# Patient Record
Sex: Female | Born: 1970
Health system: Southern US, Community
[De-identification: ages and names within clinical notes are randomized; demographics above are authoritative.]

## PROBLEM LIST (undated history)

## (undated) ENCOUNTER — Ambulatory Visit: Payer: 59 | Source: Home / Self Care

## (undated) DIAGNOSIS — B009 Herpesviral infection, unspecified: Secondary | ICD-10-CM

## (undated) DIAGNOSIS — F32A Depression, unspecified: Secondary | ICD-10-CM

## (undated) DIAGNOSIS — R519 Headache, unspecified: Secondary | ICD-10-CM

## (undated) DIAGNOSIS — I639 Cerebral infarction, unspecified: Secondary | ICD-10-CM

## (undated) DIAGNOSIS — I82409 Acute embolism and thrombosis of unspecified deep veins of unspecified lower extremity: Secondary | ICD-10-CM

## (undated) DIAGNOSIS — R51 Headache: Secondary | ICD-10-CM

## (undated) DIAGNOSIS — N390 Urinary tract infection, site not specified: Secondary | ICD-10-CM

## (undated) HISTORY — DX: Herpesviral infection, unspecified: B00.9

## (undated) HISTORY — PX: APPENDECTOMY: SHX54

---

## 1997-11-19 ENCOUNTER — Ambulatory Visit (HOSPITAL_COMMUNITY): Admission: RE | Admit: 1997-11-19 | Discharge: 1997-11-19 | Payer: Self-pay | Admitting: Obstetrics and Gynecology

## 1998-06-15 ENCOUNTER — Other Ambulatory Visit: Admission: RE | Admit: 1998-06-15 | Discharge: 1998-06-15 | Payer: Self-pay | Admitting: *Deleted

## 1999-06-16 ENCOUNTER — Other Ambulatory Visit: Admission: RE | Admit: 1999-06-16 | Discharge: 1999-06-16 | Payer: Self-pay | Admitting: *Deleted

## 2000-01-25 ENCOUNTER — Ambulatory Visit (HOSPITAL_COMMUNITY): Admission: RE | Admit: 2000-01-25 | Discharge: 2000-01-25 | Payer: Self-pay

## 2000-01-26 ENCOUNTER — Inpatient Hospital Stay (HOSPITAL_COMMUNITY): Admission: EM | Admit: 2000-01-26 | Discharge: 2000-01-28 | Payer: Self-pay | Admitting: Emergency Medicine

## 2000-01-26 ENCOUNTER — Encounter (INDEPENDENT_AMBULATORY_CARE_PROVIDER_SITE_OTHER): Payer: Self-pay | Admitting: *Deleted

## 2002-05-28 ENCOUNTER — Encounter: Payer: Self-pay | Admitting: Emergency Medicine

## 2002-05-28 ENCOUNTER — Emergency Department (HOSPITAL_COMMUNITY): Admission: EM | Admit: 2002-05-28 | Discharge: 2002-05-28 | Payer: Self-pay | Admitting: Emergency Medicine

## 2002-06-02 ENCOUNTER — Encounter: Admission: RE | Admit: 2002-06-02 | Discharge: 2002-08-31 | Payer: Self-pay | Admitting: Internal Medicine

## 2003-07-03 ENCOUNTER — Emergency Department (HOSPITAL_COMMUNITY): Admission: EM | Admit: 2003-07-03 | Discharge: 2003-07-03 | Payer: Self-pay | Admitting: Emergency Medicine

## 2006-03-20 DIAGNOSIS — Z8673 Personal history of transient ischemic attack (TIA), and cerebral infarction without residual deficits: Secondary | ICD-10-CM | POA: Diagnosis present

## 2006-03-20 DIAGNOSIS — I639 Cerebral infarction, unspecified: Secondary | ICD-10-CM

## 2006-03-20 HISTORY — DX: Cerebral infarction, unspecified: I63.9

## 2007-08-21 ENCOUNTER — Observation Stay (HOSPITAL_COMMUNITY): Admission: EM | Admit: 2007-08-21 | Discharge: 2007-08-22 | Payer: Self-pay | Admitting: Emergency Medicine

## 2008-07-02 ENCOUNTER — Ambulatory Visit (HOSPITAL_COMMUNITY): Admission: RE | Admit: 2008-07-02 | Discharge: 2008-07-02 | Payer: Self-pay | Admitting: Family Medicine

## 2008-07-30 ENCOUNTER — Inpatient Hospital Stay (HOSPITAL_COMMUNITY): Admission: AD | Admit: 2008-07-30 | Discharge: 2008-07-30 | Payer: Self-pay | Admitting: Obstetrics and Gynecology

## 2008-09-04 ENCOUNTER — Ambulatory Visit (HOSPITAL_COMMUNITY): Admission: RE | Admit: 2008-09-04 | Discharge: 2008-09-04 | Payer: Self-pay | Admitting: Obstetrics and Gynecology

## 2008-09-29 ENCOUNTER — Encounter: Admission: RE | Admit: 2008-09-29 | Discharge: 2008-09-29 | Payer: Self-pay | Admitting: Obstetrics and Gynecology

## 2008-10-13 ENCOUNTER — Ambulatory Visit (HOSPITAL_COMMUNITY): Admission: RE | Admit: 2008-10-13 | Discharge: 2008-10-13 | Payer: Self-pay | Admitting: Obstetrics and Gynecology

## 2008-12-14 ENCOUNTER — Inpatient Hospital Stay (HOSPITAL_COMMUNITY): Admission: AD | Admit: 2008-12-14 | Discharge: 2008-12-14 | Payer: Self-pay | Admitting: Obstetrics and Gynecology

## 2009-01-12 ENCOUNTER — Inpatient Hospital Stay (HOSPITAL_COMMUNITY): Admission: AD | Admit: 2009-01-12 | Discharge: 2009-01-13 | Payer: Self-pay | Admitting: Obstetrics

## 2009-01-28 ENCOUNTER — Inpatient Hospital Stay (HOSPITAL_COMMUNITY): Admission: AD | Admit: 2009-01-28 | Discharge: 2009-01-31 | Payer: Self-pay | Admitting: Obstetrics and Gynecology

## 2009-02-02 ENCOUNTER — Encounter: Admission: RE | Admit: 2009-02-02 | Discharge: 2009-03-03 | Payer: Self-pay | Admitting: Obstetrics and Gynecology

## 2009-02-02 ENCOUNTER — Ambulatory Visit: Admission: RE | Admit: 2009-02-02 | Discharge: 2009-02-02 | Payer: Self-pay | Admitting: Obstetrics and Gynecology

## 2009-03-04 ENCOUNTER — Encounter: Admission: RE | Admit: 2009-03-04 | Discharge: 2009-03-18 | Payer: Self-pay | Admitting: Obstetrics and Gynecology

## 2010-04-11 ENCOUNTER — Encounter: Payer: Self-pay | Admitting: Obstetrics and Gynecology

## 2010-06-22 LAB — CBC
HCT: 31.1 % — ABNORMAL LOW (ref 36.0–46.0)
HCT: 34.3 % — ABNORMAL LOW (ref 36.0–46.0)
Hemoglobin: 11.4 g/dL — ABNORMAL LOW (ref 12.0–15.0)
Platelets: 147 10*3/uL — ABNORMAL LOW (ref 150–400)
Platelets: 159 10*3/uL (ref 150–400)
RDW: 14.1 % (ref 11.5–15.5)
RDW: 14.5 % (ref 11.5–15.5)
WBC: 10.4 10*3/uL (ref 4.0–10.5)
WBC: 7.4 10*3/uL (ref 4.0–10.5)

## 2010-06-22 LAB — GLUCOSE, CAPILLARY
Glucose-Capillary: 111 mg/dL — ABNORMAL HIGH (ref 70–99)
Glucose-Capillary: 133 mg/dL — ABNORMAL HIGH (ref 70–99)
Glucose-Capillary: 68 mg/dL — ABNORMAL LOW (ref 70–99)

## 2010-06-24 LAB — URINALYSIS, ROUTINE W REFLEX MICROSCOPIC
Nitrite: NEGATIVE
Protein, ur: NEGATIVE mg/dL
Specific Gravity, Urine: 1.015 (ref 1.005–1.030)
Urobilinogen, UA: 0.2 mg/dL (ref 0.0–1.0)

## 2010-08-02 NOTE — Discharge Summary (Signed)
NAME:  Susan King, Susan King                  ACCOUNT NO.:  [id number]   MEDICAL RECORD NO.:  [id number]          PATIENT TYPE:  INP   LOCATION:  3730                         FACILITY:  [facility]   PHYSICIAN:  Mohan N. Sharyn Lull, M.D. DATE OF BIRTH:  June 08, 1970   DATE OF ADMISSION:  08/21/2007  DATE OF DISCHARGE:  08/22/2007                               DISCHARGE SUMMARY   ADMITTING DIAGNOSES:  1. Chest pain, some features worrisome for angina.  2. Rule out myocardial infarction.  3. Tobacco abuse.  4. History of herpes simplex infection.  5. Positive family history of coronary artery disease.   DISCHARGE DIAGNOSES:  1. Status post chest pain.  2. Myocardial infraction, ruled out.  3. Status post left catheterization.  4. Mild coronary artery disease.  5. Rule out gastroesophageal reflux disease.  6. Tobacco abuse.  7. History of herpes simplex infection in the past.  8. Positive family history of coronary artery disease.  9. Hypercholesteremia, controlled by diet.   DISCHARGE MEDICATIONS:  1. Enteric-coated aspirin 81 mg 1 tablet daily.  2. Nexium 40 mg 1 capsule daily, half hour before breakfast.  3. Nitrostat 0.4 mg sublingual use as directed.   DIET:  Low salt, low cholesterol, and heart healthy diet.  Post cardiac  cath instructions have been given.  Increase activity slowly.  Avoid any  lifting, driving, pushing, or pulling for 48 hours.  Follow up with me  next week.   CONDITION AT DISCHARGE:  Stable.   BRIEF HISTORY AND HOSPITAL COURSE:  Mr. Ledell Peoples is a 40 year old black  female with no significant past medical history except for herpes  simplex infection and tobacco abuse.  She came to the ER via EMS  complaining of retrosternal chest pain described as pressure and sharp  since this morning.  While at work, states pain got worse, so decided to  go to [facility].  She states chest pain radiated to  the left arm associated with numbness, relieved with aspirin  and  sublingual nitro and transferred to [facility] by EMS.  EKG  done in the ER showed normal sinus rhythm with nonspecific ST-T wave  changes in the inferior leads.  Denies such episodes of chest pain in  the past.  Denies exertional chest pain, but gets chest pain when on  distress.   PAST MEDICAL HISTORY:  As above.   PAST SURGICAL HISTORY:  She had appendectomy 7 years ago.   SOCIAL HISTORY:  She is single, 1 child.  Smokes less than 1 pack per  day for 19 years.  Drinks wine occasionally socially.  Works as a  Production designer, theatre/television/film for [employer].   FAMILY HISTORY:  Father died of MI at the age of 60.  Mother is alive.  She is hypertensive.  She has high cholesterol.  One sister has  congestive heart failure.   PHYSICAL EXAMINATION:  GENERAL:  She is alert, awake, and oriented x3,  in no acute distress.  VITAL SIGNS:  Blood pressure was 109/81, pulse was 72 regular.  Conjunctivae was pink.  NECK:  Supple.  No JVD.  No bruit.  LUNGS:  Clear to auscultation without rhonchi or rales.  CARDIOVASCULAR:  S1 and S2 is normal.  There was soft systolic murmur.  There was no history of gallop.  There was no rub.  ABDOMEN:  Soft.  Bowel sounds are present, nontender.  EXTREMITIES:  There is no clubbing, cyanosis, or edema.   LABS:  Two sets of cardiac enzymes were negative. Her hemoglobin was  12.6, hematocrit 38, white count of 5.5.  Sodium was 137, potassium 3.6,  glucose 81, BUN 9, and creatinine 0.67.  Her C-reactive protein was  elevated at 1.6.  Urine pregnancy test was negative. Cholesterol was 158  and triglycerides were 241, HDL was 31, and LDL was 89.   BRIEF HOSPITAL COURSE:  The patient was admitted to telemetry unit.  MI  was ruled out by serial enzymes and EKG.  I discussed with the patient  regarding noninvasive stress testing versus left cath possible PTCA  stenting, its risks and benefits, i.e. death, MI, stroke, need for  emergency CABG, risk of restenosis, and local  vascular complications,  etc. and consented for invasive procedure.  The patient subsequently  underwent left cardiac cath with selective left and right coronary  angiography.  This morning, the patient tolerated procedure well.  There  are no complications as per procedure report.  The patient did not have  any episodes of chest pain during the hospital stay.  The patient's  groin is stable with no evidence of hematoma or bruit.  The patient will  be discharged home later this afternoon if remained hemodynamically  stable.      Eduardo Osier. Sharyn Lull, M.D.  Electronically Signed     MNH/MEDQ  D:  08/22/2007  T:  08/23/2007  Job:  [id number]

## 2010-08-02 NOTE — Cardiovascular Report (Signed)
NAME:  MUSE, Mehgan                  ACCOUNT NO.:  [id number]   MEDICAL RECORD NO.:  [id number]          PATIENT TYPE:  INP   LOCATION:  3730                         FACILITY:  [facility]   PHYSICIAN:  Mohan N. Sharyn Lull, M.D. DATE OF BIRTH:  Apr 02, 1970   DATE OF PROCEDURE:  08/22/2007  DATE OF DISCHARGE:  08/22/2007                            CARDIAC CATHETERIZATION   PROCEDURES:  1. Left cardiac catheterization with selective left and right coronary      angiography.  2. Left ventricular graft via right groin using Judkins technique.   INDICATIONS FOR PROCEDURE:  Ms. Ledell Peoples is a 40 year old black female with  no significant past medical history except for herpes simplex and  tobacco abuse.  She came to the ER via EMS complaining of retrosternal  chest pain, described as pressure and sharp since this a.m. while at  work.  She states the pain got worse, so decided to go to [facility].  She states chest pain radiated to left arm associated  with numbness in the arm.  The patient received enteric-coated aspirin  and sublingual nitroglycerin with partial relief and was [facility].  EKG done by EMS showed normal sinus rhythm  and nonspecific T-wave changes in the inferior leads.  She denies any  history of such episode of chest pain in the past.  She denies  exertional chest pain, but gets chest pain when under stress.   PAST MEDICAL HISTORY:  As above.   PAST SURGICAL HISTORY:  She had appendectomy approximately 7 years ago.   SOCIAL HISTORY:  She is single.  She has one child.  She smoked less  than 1 pack per day for 19 years.  She drinks wine occasionally  socially.  She works as Production designer, theatre/television/film for [employer].   FAMILY HISTORY:  Father died of MI at the age of 67.  Mother is alive,  she is hypertensive, she has high cholesterol.  One sister has  congestive heart failure.   PHYSICAL EXAMINATION:  GENERAL:  She is alert and oriented x3, in no  acute distress.  VITAL SIGNS:  Blood pressure was 109/81 and pulse was 72 and regular.  EYES:  Conjunctivae were pink.  NECK:  Supple.  No JVD.  No bruit.  LUNGS:  Clear to auscultation without rhonchi or rales.  CARDIOVASCULAR:  S1 and S2 were normal.  There was soft systolic murmur.  There was no S3, gallop, or rub.  ABDOMEN:  Soft, bowel sounds present, and nontender.  EXTREMITIES:  There is no clubbing, cyanosis, or edema.   BRIEF HOSPITAL COURSE:  The patient was admitted to telemetry unit.  MI  was ruled out by serial enzymes and EKG.  I discussed with the patient  and her mother and boyfriend at length regarding noninvasive stress  testing versus left cath, possible PTCA stenting and its risks and  benefits i.e., death, MI, stroke, need for emergency CABG, risk of  restenosis, local vascular complications, etc., and consented for PCI.   PROCEDURE:  After obtaining the informed consent, the  patient was  brought to the cath lab and was placed on fluoroscopy table.  The right  groin was prepped and draped in the usual fashion.  A 2% Xylocaine was  used for local anesthesia in the right groin.  With the help of thin-  walled needle, a 6-French arterial sheath was placed.  The sheath was  aspirated and flushed.  Next, a 6-French left Judkins catheter was  advanced over the wire under fluoroscopic guidance up to the ascending  aorta.  Wire was pulled out.  The catheter was aspirated and connected  to the manifold.  Catheter was further advanced and engaged into left  coronary ostium.  Multiple views of the left system were taken.  Next,  the catheter was disengaged and was pulled out over the wire and was  replaced with 6-French right Judkins catheter, which was advanced over  the wire under fluoroscopic guidance up to the ascending aorta.  Wire  was pulled out.  The catheter was aspirated and connected to the  manifold.  Catheter was further advanced and engaged into right coronary   ostium.  Multiple views of the right system were taken.  Next, the  catheter was disengaged and was pulled out over the wire and was  replaced with 6-French pigtail catheter, which was advanced over the  wire under fluoroscopic guidance up to the ascending aorta.  Catheter  was further advanced across the aortic valve into the LV.  LV pressures  were recorded.  Next, LV graft was done in 30 degrees RAO position.  Postangiographic pressures were recorded from LV and then pullback  pressures were recorded from the aorta.  There was no gradient across  the aortic valve.  Next, the pigtail catheter was pulled out over the  wire.  Sheaths were aspirated and flushed.   FINDINGS:  LV showed good LV systolic function, EF of 55-60%.  Left main  was patent.  LAD has 5-10% mid stenosis.  Diagonal 1 to diagonal 4 were  very small, which were patent.  Left circumflex was patent.  OM1 was  small, which was patent.  OM-2 was very, very small which was patent.  OM-3 was moderate size, which was patent.  OM-4 and OM-5 were small,  which were patent.  RCA was patent.  PDA was very small, which was  patent.  PLV branches were very, very small.  The patient has codominant  coronary system.  The patient tolerated the procedure well.  There were  no complications.  The patient was transferred to the recovery room in  stable condition.      Eduardo Osier. Sharyn Lull, M.D.  Electronically Signed     MNH/MEDQ  D:  08/22/2007  T:  08/22/2007  Job:  [id number]

## 2010-08-02 NOTE — H&P (Signed)
Susan King, Susan King                  ACCOUNT NO.:  [id number]   MEDICAL RECORD NO.:  [id number]          PATIENT TYPE:  OUT   LOCATION:  MFM                           FACILITY:  [facility]   PHYSICIAN:  Lenoard Aden, M.D.DATE OF BIRTH:  Nov 15, 1970   DATE OF ADMISSION:  10/13/2008  DATE OF DISCHARGE:  10/13/2008                              HISTORY & PHYSICAL   CHIEF COMPLAINT:  Pelvic pressure.   She is a 40 year old African American female G2, P1 with a history of  preterm birth at 53 weeks, who presents now for pelvic pressure.  She  denies regular contractions, bleeding, or leakage of fluid.  Has had low  back pain.  Pregnancy has been complicated by preterm cervical change  with stable cervical length on ultrasound.   Her medications include Valtrex and prenatal vitamins.  She also takes  17-hydroxyprogesterone weekly.   She is a nonsmoker, nondrinker.   Denies domestic physical violence.  History of preterm birth as noted.   PHYSICAL EXAMINATION:  GENERAL:  She is a well-developed, well-nourished  [place] female in no acute distress.  HEENT: Normal.  LUNGS:  Clear.  HEART:  Regular rate and rhythm.  ABDOMEN:  Soft, gravid, nontender.  No CVA tenderness.  EXTREMITIES:  No cords.  NEUROLOGIC:  Nonfocal.  SKIN:  Intact.  No herpes lesions noted.  PELVIC:  Cervix is closed, 2 cm on vertex, -1.   NST is reactive.  No contractions are noted in initial monitoring  pattern.  FFN is pending.   IMPRESSION:  1. 32-week OB.  2. Gestational diabetic, stable on diet.  3. History of preterm birth on 17-hydroxyprogesterone weekly.   PLAN:  Check FFN.  Continuous monitoring x1 hour.  Check urinalysis.  Discharge home pending results.      Lenoard Aden, M.D.  Electronically Signed    RJT/MEDQ  D:  12/14/2008  T:  12/15/2008  Job:  [id number]

## 2010-08-05 NOTE — Op Note (Signed)
[facility]  Patient:    Susan King, Susan King                         MRN: [id number] Proc. Date: 01/26/00 Adm. Date:  [id number] Attending:  Meredith Leeds                           Operative Report  PREOPERATIVE DIAGNOSIS:  Acute appendicitis.  POSTOPERATIVE DIAGNOSIS:  Acute appendicitis.  OPERATION:  Laparoscopic appendectomy.  SURGEON:  Zigmund Daniel, M.D.  ANESTHESIA:  General.  PROCEDURE:  After adequate monitoring and general anesthesia and routine preparation and draping of the abdomen, and insertion of a Foley catheter, I made a small infraumbilical incision and dissected down to the fascia and opened it longitudinally for about 1 cm.  I opened the peritoneum bluntly.  I placed an 0 Vicryl purse-string suture and secured a Hasson cannula.  Then, inflated the abdomen with CO2.  I then, under direct vision, placed a port in the right mid abdomen 5 mm in size and used Glassman clamp to mobilize the appendix slightly and pull up on the cecum and I could see that the appendix was inflamed at the tip and otherwise appeared normal.  No other abnormalities in the pelvis were noted.  I put in a 12 mm left lower quadrant port under direct vision and dissected the mesoappendix with electrocautery and then clipped the appendiceal artery and divided it.  I then occluded and divided the appendix with the endoscopic stapler.  The appendix was rather small, so I pulled it up into the 12 mm port and removed it inside the port avoiding contamination of the incision.  I then carefully inspected the area of operation and found that bleeding was under good control and that the appendiceal stump was well-occluded.  I removed the ports under direct vision. I tied the purse-string suture after removing the CO2.  I closed the skin of all incisions with intracuticular 4-0 Vicryl and Steri-Strips.  The patient tolerated the operation well. DD:   01/26/00 TD:  01/26/00 Job: [id number]

## 2010-12-15 LAB — APTT: aPTT: 27

## 2010-12-15 LAB — CBC
HCT: 35.7 — ABNORMAL LOW
Hemoglobin: 12.2
MCHC: 34.2
MCV: 85.5
MCV: 86.1
Platelets: 213
Platelets: 226
RBC: 4.15
RBC: 4.4
WBC: 5.4
WBC: 5.5
WBC: 6.4

## 2010-12-15 LAB — LIPID PANEL
Cholesterol: 168
HDL: 31 — ABNORMAL LOW
Total CHOL/HDL Ratio: 5.4
Triglycerides: 241 — ABNORMAL HIGH

## 2010-12-15 LAB — BASIC METABOLIC PANEL
CO2: 23
Calcium: 8.8
Creatinine, Ser: 0.77
GFR calc Af Amer: >60
GFR calc non Af Amer: >60
Glucose, Bld: 98
Sodium: 138

## 2010-12-15 LAB — DIFFERENTIAL
Basophils Absolute: 0.1
Basophils Relative: 2 — ABNORMAL HIGH
Eosinophils Absolute: 0.2
Eosinophils Relative: 4
Monocytes Absolute: 0.4
Monocytes Relative: 8
Neutro Abs: 2.3

## 2010-12-15 LAB — CK TOTAL AND CKMB (NOT AT ARMC)
CK, MB: 1.3
Total CK: 119

## 2010-12-15 LAB — POCT CARDIAC MARKERS
CKMB, poc: 1.1
Myoglobin, poc: 52.5
Operator id: 272551
Troponin i, poc: <0.05

## 2010-12-15 LAB — CARDIAC PANEL(CRET KIN+CKTOT+MB+TROPI)
CK, MB: 1.3
Relative Index: 1.1
Total CK: 118
Total CK: 79
Troponin I: <0.01

## 2010-12-15 LAB — COMPREHENSIVE METABOLIC PANEL
ALT: 21
Albumin: 3.7
Alkaline Phosphatase: 76
CO2: 25
Calcium: 8.8
Creatinine, Ser: 0.67
GFR calc Af Amer: >60
Total Bilirubin: 1.1

## 2010-12-15 LAB — POCT I-STAT, CHEM 8
BUN: 11
Calcium, Ion: 1.09 — ABNORMAL LOW
HCT: 39
Hemoglobin: 13.3
Sodium: 138
TCO2: 25

## 2011-05-23 ENCOUNTER — Other Ambulatory Visit: Payer: Self-pay | Admitting: Family Medicine

## 2011-05-23 DIAGNOSIS — N852 Hypertrophy of uterus: Secondary | ICD-10-CM

## 2011-05-25 ENCOUNTER — Other Ambulatory Visit: Payer: Self-pay

## 2011-05-30 ENCOUNTER — Ambulatory Visit
Admission: RE | Admit: 2011-05-30 | Discharge: 2011-05-30 | Disposition: A | Discharge status: Home / Self Care | Payer: 59 | Source: Ambulatory Visit | Attending: Family Medicine | Admitting: Family Medicine

## 2011-05-30 DIAGNOSIS — N852 Hypertrophy of uterus: Secondary | ICD-10-CM

## 2012-06-21 ENCOUNTER — Emergency Department (HOSPITAL_COMMUNITY)
Admission: EM | Admit: 2012-06-21 | Discharge: 2012-06-21 | Disposition: A | Discharge status: Home / Self Care | Payer: 59 | Attending: Emergency Medicine | Admitting: Emergency Medicine

## 2012-06-21 ENCOUNTER — Encounter (HOSPITAL_COMMUNITY): Payer: Self-pay | Admitting: Emergency Medicine

## 2012-06-21 DIAGNOSIS — R55 Syncope and collapse: Secondary | ICD-10-CM | POA: Insufficient documentation

## 2012-06-21 DIAGNOSIS — Z3202 Encounter for pregnancy test, result negative: Secondary | ICD-10-CM | POA: Insufficient documentation

## 2012-06-21 LAB — POCT PREGNANCY, URINE: Preg Test, Ur: NEGATIVE

## 2012-06-21 LAB — BASIC METABOLIC PANEL
BUN: 10 mg/dL (ref 6–23)
Calcium: 9.1 mg/dL (ref 8.4–10.5)
Chloride: 103 mEq/L (ref 96–112)
Creatinine, Ser: 0.73 mg/dL (ref 0.50–1.10)
GFR calc Af Amer: >90 mL/min (ref >90)
GFR calc non Af Amer: >90 mL/min (ref >90)

## 2012-06-21 LAB — CBC
HCT: 37.7 % (ref 36.0–46.0)
MCH: 27.8 pg (ref 26.0–34.0)
MCHC: 33.7 g/dL (ref 30.0–36.0)
MCV: 82.5 fL (ref 78.0–100.0)
Platelets: 206 10*3/uL (ref 150–400)
RDW: 13.2 % (ref 11.5–15.5)
WBC: 9.7 10*3/uL (ref 4.0–10.5)

## 2012-06-21 MED ORDER — IBUPROFEN 400 MG PO TABS
400.0000 mg | ORAL_TABLET | Freq: Once | ORAL | Status: AC
  Administered 2012-06-21: 400 mg via ORAL
  Filled 2012-06-21: qty 1

## 2012-06-21 NOTE — ED Notes (Signed)
Spoke with communications and was told glasses were on EMS truck and that EMTs would bring glasses to pt when available; they were given pt information on where pt would be

## 2012-06-21 NOTE — ED Notes (Signed)
Pt given food and ambulated in hall via [facility] EMT per Dr Effie Shy order

## 2012-06-21 NOTE — ED Notes (Signed)
Per EMS: pt had near syncopal episode at bank with some nausea; pt hyperventilating upon arrival and anxious; lower right and left abdominal cramping and started menstrual cycle today; pt has not eaten or drank anything today; pt has started new diet last Thursday (stopped carbs and sugars) without changing daily routine; no hx/no meds/no allergies. BP: 148/110 pulse 110 CBG 90; no n/v/d; no fevers/chills; NSR on monitor

## 2012-06-21 NOTE — ED Notes (Signed)
Dr. Wentz at bedside. 

## 2012-06-21 NOTE — ED Notes (Signed)
2 failed attempt to start IV on right hand and left [facility]

## 2012-06-21 NOTE — ED Notes (Signed)
Pt alert and mentating appropriately upon d/c teaching; pt given d/c teaching and follow up care instructions; pt verbalizes understanding of d/c teaching and has no further questions upon d/c. NAD noted upon d/c. Pt denies pain. Pt ambulatory upon d/c.

## 2012-06-21 NOTE — ED Notes (Addendum)
Pt notified that we are contacting EMS to find her glasses

## 2012-06-21 NOTE — ED Provider Notes (Signed)
History     CSN: [id number]  Arrival date & time 06/21/12  1351   First MD Initiated Contact with Patient 06/21/12 1745      Chief Complaint  Patient presents with  . Near Syncope    (Consider location/radiation/quality/duration/timing/severity/associated sxs/prior treatment) HPI Comments: Lowen Mansouri is a 42 y.o. female who complains of near syncope while walking, at work, today. She had not eaten anything, at that time. She is hungry. She has not eaten anything at all today. She denies headache cough, shortness of breath, chest pain, back pain, or paresthesias. She felt better when she sat down. She's never had this previously.  The history is provided by the patient.    History reviewed. No pertinent past medical history.  History reviewed. No pertinent past surgical history.  History reviewed. No pertinent family history.  History  Substance Use Topics  . Smoking status: Not on file  . Smokeless tobacco: Not on file  . Alcohol Use: Not on file    OB History   Grav Para Term Preterm Abortions TAB SAB Ect Mult Living                  Review of Systems  All other systems reviewed and are negative.    Allergies  Review of patient's allergies indicates no known allergies.  Home Medications   Current Outpatient Rx  Name  Route  Sig  Dispense  Refill  . acetaminophen (TYLENOL) 500 MG tablet   Oral   Take 1,000 mg by mouth every 6 (six) hours as needed for pain.         Marland Kitchen ibuprofen (ADVIL,MOTRIN) 200 MG tablet   Oral   Take 400 mg by mouth every 6 (six) hours as needed for pain (for pain.).           BP 113/65  Pulse 99  Temp(Src) 98.5 F (36.9 C) (Oral)  Resp 20  SpO2 98%  LMP 06/21/2012  Physical Exam  Nursing note and vitals reviewed. Constitutional: She is oriented to person, place, and time. She appears well-developed.  Obese  HENT:  Head: Normocephalic and atraumatic.  Eyes: Conjunctivae and EOM are normal. Pupils are equal, round, and  reactive to light.  Neck: Normal range of motion and phonation normal. Neck supple.  Cardiovascular: Normal rate, regular rhythm and intact distal pulses.   Pulmonary/Chest: Effort normal and breath sounds normal. She exhibits no tenderness.  Abdominal: Soft. She exhibits no distension. There is no tenderness. There is no guarding.  Musculoskeletal: Normal range of motion.  Neurological: She is alert and oriented to person, place, and time. She has normal strength. She exhibits normal muscle tone.  Skin: Skin is warm and dry.  Psychiatric: She has a normal mood and affect. Her behavior is normal. Judgment and thought content normal.    ED Course  Procedures (including critical care time)  Patient fed meal. Tolerated well  Ambulation trial: She walks easily without recurrent symptoms. Repeat vital signs are normal.    Date: 01/05/2012  Rate: 76  Rhythm: normal sinus rhythm  QRS Axis: normal  PR and QT Intervals: normal  ST/T Wave abnormalities: normal  PR and QRS Conduction Disutrbances:none  Narrative Interpretation:   Old EKG Reviewed: none available   Labs Reviewed  CBC  BASIC METABOLIC PANEL  GLUCOSE, CAPILLARY  POCT PREGNANCY, URINE   Nursing Notes Reviewed/ Care Coordinated, and agree without changes. Applicable Imaging Reviewed Interpretation of Laboratory Data incorporated into ED treatment   1.  Near syncope       MDM  Near-syncope without abnormalities found on EP evaluation. Doubt metabolic instability, serious bacterial infection or impending vascular collapse; the patient is stable for discharge.   Plan: Home Medications- usual; Home Treatments- rest, eat regularly; Recommended follow up- PCP prn        Flint Melter, MD 06/21/12 2024

## 2012-06-21 NOTE — ED Notes (Signed)
Pt ambulated without difficulty

## 2012-06-21 NOTE — ED Notes (Signed)
Pt states she has had previous "small stroke" in past. Pt states this does not feel the same as last time; pt denies numbness and tingling; pt dizziness and states "a little lightheaded but not really." Pt states "a little cold." Pt alert and mentating appropriately.

## 2012-06-21 NOTE — ED Notes (Signed)
Old and new EKG handed to Dr. Effie Shy.  Extrac copies of both placed in pt chart

## 2013-01-20 ENCOUNTER — Emergency Department (HOSPITAL_COMMUNITY)
Admission: EM | Admit: 2013-01-20 | Discharge: 2013-01-21 | Disposition: A | Discharge status: Home / Self Care | Payer: 59 | Attending: Emergency Medicine | Admitting: Emergency Medicine

## 2013-01-20 ENCOUNTER — Emergency Department (HOSPITAL_COMMUNITY): Payer: 59

## 2013-01-20 ENCOUNTER — Encounter (HOSPITAL_COMMUNITY): Payer: Self-pay | Admitting: Emergency Medicine

## 2013-01-20 DIAGNOSIS — F17200 Nicotine dependence, unspecified, uncomplicated: Secondary | ICD-10-CM | POA: Insufficient documentation

## 2013-01-20 DIAGNOSIS — S96911A Strain of unspecified muscle and tendon at ankle and foot level, right foot, initial encounter: Secondary | ICD-10-CM

## 2013-01-20 DIAGNOSIS — S93609A Unspecified sprain of unspecified foot, initial encounter: Secondary | ICD-10-CM | POA: Insufficient documentation

## 2013-01-20 DIAGNOSIS — W010XXA Fall on same level from slipping, tripping and stumbling without subsequent striking against object, initial encounter: Secondary | ICD-10-CM | POA: Insufficient documentation

## 2013-01-20 DIAGNOSIS — Y9289 Other specified places as the place of occurrence of the external cause: Secondary | ICD-10-CM | POA: Insufficient documentation

## 2013-01-20 DIAGNOSIS — Y9302 Activity, running: Secondary | ICD-10-CM | POA: Insufficient documentation

## 2013-01-20 MED ORDER — TRAMADOL HCL 50 MG PO TABS: 50.0000 mg | ORAL_TABLET | Freq: Four times a day (QID) | ORAL | Status: DC | PRN

## 2013-01-20 NOTE — ED Provider Notes (Signed)
CSN: [id number]     Arrival date & time 01/20/13  2217 History  This chart was scribed for non-physician practitioner Antony Madura, PA-C, working with Toy Baker, MD by Dorothey Baseman, ED Scribe. This patient was seen in room [facility] and the patient's care was started at 10:52 PM.    Chief Complaint  Patient presents with  . Foot Injury   Patient is a 42 y.o. female presenting with foot injury. The history is provided by the patient. No language interpreter was used.  Foot Injury Location:  Foot Foot location:  R foot Pain details:    Radiates to:  Does not radiate   Severity:  Moderate   Onset quality:  Sudden   Timing:  Constant   Progression:  Improving Chronicity:  New Relieved by:  Elevation, ice and NSAIDs Worsened by:  Bearing weight Associated symptoms: swelling    HPI Comments: Susan King is a 42 y.o. female who presents to the Emergency Department complaining of a constant pain, 6-7/10 currently, to the bottom of the right foot with associated swelling onset 3 days ago secondary to a fall that occurred while she was running when she tripped on uneven pavement. She states that the pain is exacerbated with walking, but that she has been ambulatory and that the pain has been gradually improving. Patient reports that she has been applying ice to the area, and keeping it elevated, and taking ibuprofen at home with mild, temporary relief. Patient denies any other pertinent medical history.   History reviewed. No pertinent past medical history. History reviewed. No pertinent past surgical history. No family history on file. History  Substance Use Topics  . Smoking status: Current Every Day Smoker -- 0.15 packs/day    Types: Cigarettes  . Smokeless tobacco: Not on file  . Alcohol Use: Yes     Comment: occas.   OB History   Grav Para Term Preterm Abortions TAB SAB Ect Mult Living                 Review of Systems  Musculoskeletal: Positive for arthralgias, joint  swelling and myalgias.  All other systems reviewed and are negative.    Allergies  Review of patient's allergies indicates no known allergies.  Home Medications   Current Outpatient Rx  Name  Route  Sig  Dispense  Refill  . acetaminophen (TYLENOL) 500 MG tablet   Oral   Take 1,000 mg by mouth every 6 (six) hours as needed for pain.         Marland Kitchen ibuprofen (ADVIL,MOTRIN) 200 MG tablet   Oral   Take 400 mg by mouth every 6 (six) hours as needed for pain (for pain.).         Marland Kitchen traMADol (ULTRAM) 50 MG tablet   Oral   Take 1 tablet (50 mg total) by mouth every 6 (six) hours as needed for pain.   15 tablet   0    Triage Vitals: BP 136/71  Pulse 104  Temp(Src) 98.1 F (36.7 C) (Oral)  Resp 20  Ht 5\' 2"  (1.575 m)  SpO2 100%  LMP 01/01/2013  Physical Exam  Nursing note and vitals reviewed. Constitutional: She is oriented to person, place, and time. She appears well-developed and well-nourished. No distress.  HENT:  Head: Normocephalic and atraumatic.  Eyes: Conjunctivae and EOM are normal. No scleral icterus.  Neck: Normal range of motion.  Cardiovascular:  Pulses:      Dorsalis pedis pulses are 2+ on the  right side, and 2+ on the left side.       Posterior tibial pulses are 2+ on the right side, and 2+ on the left side.  Pulmonary/Chest: Effort normal. No respiratory distress.  Musculoskeletal: Normal range of motion.  Tenderness to palpation to the medial arch of the right foot. Mild swelling to the dorsal aspect of her right foot.   Neurological: She is alert and oriented to person, place, and time. She has normal reflexes.  Sensation is intact. DTRs normal and symmetric.   Skin: Skin is warm and dry. No rash noted. She is not diaphoretic. No erythema. No pallor.  Psychiatric: She has a normal mood and affect. Her behavior is normal.    ED Course  Procedures (including critical care time)  DIAGNOSTIC STUDIES: Oxygen Saturation is 100% on room air, normal by my  interpretation.    COORDINATION OF CARE: 10:56 PM- Ordered x-ray of the right foot. Advised patient to continue applying ice and taking ibuprofen at home to manage symptoms. Advised patient to follow up with the referred orthopaedist. Discussed treatment plan with patient at bedside and patient verbalized agreement.    Labs Review Labs Reviewed - No data to display  Imaging Review Dg Foot Complete Right  01/20/2013   CLINICAL DATA:  Fall while running, right foot pain.  EXAM: RIGHT FOOT COMPLETE - 3+ VIEW  COMPARISON:  None available for comparison at time of study interpretation.  FINDINGS: No acute fracture deformity or dislocation. Apparent bipartite 1st metatarsal tibial sesamoid. Small calcaneal spur at the plantar fascia insertion. Joint space intact without erosions. No destructive bony lesions. Small os perineum. Diffuse soft tissue swelling without subcutaneous gas nor radiopaque foreign bodies.  IMPRESSION: No acute fracture deformity or dislocation. Apparent bipartite 1st metatarsal tibial sesamoid, recommend correlation with point tenderness.   Electronically Signed   By: Awilda Metro   On: 01/20/2013 22:52    EKG Interpretation   None       MDM   1. Right foot strain, initial encounter    R foot strain, uncomplicated. Patient well and nontoxic appearing, hemodynamically stable, and afebrile. Patient neurovascularly intact with normal sensation and reflexes in b/l lower extremities. She has been ambulatory since the incident. Xray without evidence of fracture or dislocation. Ankle/foot brace applied for stability and patient given crutches for WBAT. RICE advised and patient given Rx for tramadol for pain control. Orthopedic referral provided should symptoms not improve with recommended tx. Return precautions discussed and patient agreeable to plan with no unaddressed concerns.  I personally performed the services described in this documentation, which was scribed in my  presence. The recorded information has been reviewed and is accurate.      Antony Madura, PA-C 01/23/13 [phone]

## 2013-01-20 NOTE — ED Notes (Signed)
Pt states that she was running on Friday and fell; c/o rt foot pain; pt c/o pain to bottom of foot radiating through to the top of her foot; minimal swelling noted to area; + CNS; pt states that the foot feels better when has a shoe or some pressure to it; pt states that she has been applying ice and elevating the foot without relief.

## 2013-01-23 ENCOUNTER — Other Ambulatory Visit: Payer: Self-pay

## 2013-01-23 NOTE — ED Provider Notes (Signed)
Medical screening examination/treatment/procedure(s) were performed by non-physician practitioner and as supervising physician I was immediately available for consultation/collaboration.  EKG Interpretation   None        Field Staniszewski T Zaylon Bossier, MD 01/23/13 1926 

## 2013-04-24 ENCOUNTER — Ambulatory Visit (INDEPENDENT_AMBULATORY_CARE_PROVIDER_SITE_OTHER): Payer: 59 | Admitting: Physician Assistant

## 2013-04-24 ENCOUNTER — Encounter: Payer: Self-pay | Admitting: Physician Assistant

## 2013-04-24 VITALS — BP 134/84 | HR 96 | Temp 98.3°F | Resp 18 | Ht 63.5 in | Wt 194.0 lb

## 2013-04-24 DIAGNOSIS — A088 Other specified intestinal infections: Secondary | ICD-10-CM

## 2013-04-24 DIAGNOSIS — R112 Nausea with vomiting, unspecified: Secondary | ICD-10-CM

## 2013-04-24 DIAGNOSIS — A084 Viral intestinal infection, unspecified: Secondary | ICD-10-CM

## 2013-04-24 LAB — INFLUENZA A AND B
INFLUENZA A AG: NEGATIVE
Influenza B Ag: NEGATIVE

## 2013-04-24 MED ORDER — PROMETHAZINE HCL 25 MG PO TABS: 25.0000 mg | ORAL_TABLET | Freq: Three times a day (TID) | ORAL | Status: DC | PRN

## 2013-04-24 NOTE — Progress Notes (Signed)
Patient ID: Zayne Marovich MRN: [id number], DOB: 1971-02-28, 43 y.o. Date of Encounter: 04/24/2013, 1:08 PM    Chief Complaint:  Chief Complaint  Patient presents with  . nausea/vomiting/diarrhea x 3 days     HPI: 43 y.o. year old [place] female reports that she started feeling sick on Tuesday 04/22/13. At that time had chills. That night she developed vomiting and diarrhea. Yesterday she stayed in bed all day and all night except for going back and forth to the bathroom with both vomiting and diarrhea all day. Says that she was extremely weak with no energy. Today she has continued with vomiting and diarrhea. Says that she has eaten no food so she doesnot understand where this diearrhea is even coming from. Says that the last time she truly vomited up anything was last night.  today it is all just dry heaves. However she is continuing with diarrhea and says that her last episode of diarrhea was just prior to coming to the visit.  Has had no localized/focal area of abdominal pain. No fever. Does not feel lightheaded or presyncopal.     Home Meds: See attached medication section for any medications that were entered at today's visit. The computer does not put those onto this list.The following list is a list of meds entered prior to today's visit.   Current Outpatient Prescriptions on File Prior to Visit  Medication Sig Dispense Refill  . acetaminophen (TYLENOL) 500 MG tablet Take 1,000 mg by mouth every 6 (six) hours as needed for pain.      Marland Kitchen ibuprofen (ADVIL,MOTRIN) 200 MG tablet Take 400 mg by mouth every 6 (six) hours as needed for pain (for pain.).       No current facility-administered medications on file prior to visit.    Allergies: No Known Allergies    Review of Systems: See HPI for pertinent ROS. All other ROS negative.    Physical Exam: Blood pressure 134/84, pulse 96, temperature 98.3 F (36.8 C), temperature source Oral, resp. rate 18, height 5' 3.5" (1.613 m), weight 194  lb (87.998 kg), last menstrual period 04/22/2013., Body mass index is 33.82 kg/(m^2). General:  WNWD AAF. Appears in no acute distress. Neck: Supple. No thyromegaly. No lymphadenopathy. Lungs: Clear bilaterally to auscultation without wheezes, rales, or rhonchi. Breathing is unlabored. Heart: Regular rhythm. No murmurs, rubs, or gallops. Abdomen: Soft,  non-distended with normoactive bowel sounds. No hepatomegaly. No rebound/guarding. No obvious abdominal masses. No area of increased/localized/focal tenderness with palpation. Msk:  Strength and tone normal for age. Extremities/Skin: Warm and dry.  Neuro: Alert and oriented X 3. Moves all extremities spontaneously. Gait is normal. CNII-XII grossly in tact. Psych:  Responds to questions appropriately with a normal affect.   Results for orders placed in visit on 04/24/13  INFLUENZA A AND B      Result Value Range   Source-INFBD NASAL     Inflenza A Ag NEG  Negative   Influenza B Ag NEG  Negative     ASSESSMENT AND PLAN:  43 y.o. year old female with  1. Viral gastroenteritis Prescribed Phenergan to use for nausea. Given the duration, she can use an over-the-counter Imodium to slow down the diarrhea to decrease fluid losses. Discussed dehydration and need for IV fluids. She says that she does not feel dehydrated and has not felt lightheaded or presyncopal. Discussed drinking small amounts of fluid frequently to prevent dehydration. Stay with a clear liquid diet until all vomiting and diarrhea resolved. Then  can slowly progress to a bland diet as tolerated. Follow up if develops fever or local focalized abdominal pain or if symptoms do not improve over the next couple days. Note given for out of work for 2/514 and 04/25/12. She will followup if she needs further time out of work. - promethazine (PHENERGAN) 25 MG tablet; Take 1 tablet (25 mg total) by mouth every 8 (eight) hours as needed for nausea or vomiting.  Dispense: 20 tablet; Refill: 0  2.  Nausea with vomiting - Influenza a and b   Signed, [address] [place], [place], [facility] 04/24/2013 1:08 PM

## 2013-04-29 ENCOUNTER — Encounter: Payer: Self-pay | Admitting: Family Medicine

## 2013-05-27 ENCOUNTER — Other Ambulatory Visit: Payer: Self-pay | Admitting: Family Medicine

## 2013-05-27 ENCOUNTER — Encounter: Payer: 59 | Admitting: Family Medicine

## 2013-05-27 DIAGNOSIS — Z1231 Encounter for screening mammogram for malignant neoplasm of breast: Secondary | ICD-10-CM

## 2013-05-30 ENCOUNTER — Encounter: Payer: Self-pay | Admitting: *Deleted

## 2013-06-17 ENCOUNTER — Other Ambulatory Visit: Payer: Self-pay | Admitting: Family Medicine

## 2013-06-17 ENCOUNTER — Ambulatory Visit (INDEPENDENT_AMBULATORY_CARE_PROVIDER_SITE_OTHER): Payer: 59 | Admitting: Family Medicine

## 2013-06-17 ENCOUNTER — Encounter: Payer: Self-pay | Admitting: Family Medicine

## 2013-06-17 VITALS — BP 118/64 | HR 66 | Temp 98.2°F | Resp 14 | Ht 64.0 in | Wt 197.0 lb

## 2013-06-17 DIAGNOSIS — F17200 Nicotine dependence, unspecified, uncomplicated: Secondary | ICD-10-CM

## 2013-06-17 DIAGNOSIS — Z113 Encounter for screening for infections with a predominantly sexual mode of transmission: Secondary | ICD-10-CM

## 2013-06-17 DIAGNOSIS — Z309 Encounter for contraceptive management, unspecified: Secondary | ICD-10-CM

## 2013-06-17 DIAGNOSIS — Z124 Encounter for screening for malignant neoplasm of cervix: Secondary | ICD-10-CM

## 2013-06-17 DIAGNOSIS — E669 Obesity, unspecified: Secondary | ICD-10-CM | POA: Insufficient documentation

## 2013-06-17 DIAGNOSIS — Z0000 Encounter for general adult medical examination without abnormal findings: Secondary | ICD-10-CM | POA: Insufficient documentation

## 2013-06-17 DIAGNOSIS — Z1231 Encounter for screening mammogram for malignant neoplasm of breast: Secondary | ICD-10-CM

## 2013-06-17 LAB — CBC WITH DIFFERENTIAL/PLATELET
BASOS PCT: 1 % (ref 0–1)
Basophils Absolute: 0 10*3/uL (ref 0.0–0.1)
EOS ABS: 0.2 10*3/uL (ref 0.0–0.7)
Eosinophils Relative: 5 % (ref 0–5)
HCT: 39.3 % (ref 36.0–46.0)
HEMOGLOBIN: 13.5 g/dL (ref 12.0–15.0)
Lymphocytes Relative: 37 % (ref 12–46)
Lymphs Abs: 1.8 10*3/uL (ref 0.7–4.0)
MCH: 27.8 pg (ref 26.0–34.0)
MCHC: 34.4 g/dL (ref 30.0–36.0)
MCV: 80.9 fL (ref 78.0–100.0)
Monocytes Absolute: 0.4 10*3/uL (ref 0.1–1.0)
Monocytes Relative: 8 % (ref 3–12)
NEUTROS PCT: 49 % (ref 43–77)
Neutro Abs: 2.4 10*3/uL (ref 1.7–7.7)
PLATELETS: 249 10*3/uL (ref 150–400)
RBC: 4.86 MIL/uL (ref 3.87–5.11)
RDW: 14.2 % (ref 11.5–15.5)
WBC: 4.9 10*3/uL (ref 4.0–10.5)

## 2013-06-17 LAB — COMPREHENSIVE METABOLIC PANEL
ALK PHOS: 76 U/L (ref 39–117)
ALT: 21 U/L (ref 0–35)
AST: 12 U/L (ref 0–37)
Albumin: 4.3 g/dL (ref 3.5–5.2)
BILIRUBIN TOTAL: 0.8 mg/dL (ref 0.2–1.2)
BUN: 9 mg/dL (ref 6–23)
CO2: 29 mEq/L (ref 19–32)
CREATININE: 0.81 mg/dL (ref 0.50–1.10)
Calcium: 9.2 mg/dL (ref 8.4–10.5)
Chloride: 101 mEq/L (ref 96–112)
GLUCOSE: 84 mg/dL (ref 70–99)
Potassium: 4.1 mEq/L (ref 3.5–5.3)
SODIUM: 137 meq/L (ref 135–145)
TOTAL PROTEIN: 7.3 g/dL (ref 6.0–8.3)

## 2013-06-17 LAB — WET PREP FOR TRICH, YEAST, CLUE
TRICH WET PREP: NONE SEEN
Yeast Wet Prep HPF POC: NONE SEEN

## 2013-06-17 LAB — LIPID PANEL
Cholesterol: 198 mg/dL (ref 0–200)
HDL: 46 mg/dL (ref >39)
LDL Cholesterol: 108 mg/dL — ABNORMAL HIGH (ref 0–99)
TRIGLYCERIDES: 219 mg/dL — AB (ref <150)
Total CHOL/HDL Ratio: 4.3 Ratio
VLDL: 44 mg/dL — AB (ref 0–40)

## 2013-06-17 MED ORDER — NORETHINDRONE 0.35 MG PO TABS: 1.0000 | ORAL_TABLET | Freq: Every day | ORAL | Status: DC

## 2013-06-17 NOTE — Patient Instructions (Addendum)
I recommend eye visit once a year I recommend dental visit every 6 months Goal is to  Exercise 30 minutes 5 days a week We will send a letter with lab results  Start birth control pills- take at same time every day, use back up protections for the first month Start chantix F/U 8 weeks for blood pressure check and medications

## 2013-06-18 ENCOUNTER — Encounter: Payer: Self-pay | Admitting: *Deleted

## 2013-06-18 DIAGNOSIS — Z1211 Encounter for screening for malignant neoplasm of colon: Secondary | ICD-10-CM | POA: Insufficient documentation

## 2013-06-18 DIAGNOSIS — Z309 Encounter for contraceptive management, unspecified: Secondary | ICD-10-CM | POA: Insufficient documentation

## 2013-06-18 LAB — PAP THINPREP ASCUS RFLX HPV RFLX TYPE

## 2013-06-18 LAB — GC/CHLAMYDIA PROBE AMP
CT PROBE, AMP APTIMA: NEGATIVE
GC Probe RNA: NEGATIVE

## 2013-06-18 NOTE — Assessment & Plan Note (Signed)
Since she is a light smoker as well as overweight I decided to use Micronor on her one tablet daily she will return for repeat blood pressure we did discuss the side effects

## 2013-06-18 NOTE — Progress Notes (Signed)
Patient ID: Susan King, female   DOB: 1970/08/22, 43 y.o.   MRN: [id number]   Subjective:    Patient ID: Susan King, female    DOB: [birth year], 43 y.o.   MRN: [id number]  Patient presents for CPE with Pap and Discuss Birth Control  patient here for physical exam with Pap smear. Her last Pap smear was greater than a year ago. She's not had any vaginal discharge. She is sexually active. She wants to go on birth control. She does smoke about 2-3 cigarettes a day. She does not want to have tubal ligation and she thinks she may want another child her youngest is 75 years old. She's also concerned about her weight she wanted to try a weight loss supplement. She does try to watch what she eats but does not exercise on a regular basis. She is due for fasting labs today as well as tetanus booster Mammogram also overdue    Review Of Systems:  GEN- denies fatigue, fever, weight loss,weakness, recent illness HEENT- denies eye drainage, change in vision, nasal discharge, CVS- denies chest pain, palpitations RESP- denies SOB, cough, wheeze ABD- denies N/V, change in stools, abd pain GU- denies dysuria, hematuria, dribbling, incontinence MSK- denies joint pain, muscle aches, injury Neuro- denies headache, dizziness, syncope, seizure activity       Objective:    BP 118/64  Pulse 66  Temp(Src) 98.2 F (36.8 C) (Oral)  Resp 14  Ht 5\' 4"  (1.626 m)  Wt 197 lb (89.359 kg)  BMI 33.80 kg/m2  LMP 06/07/2013 GEN- NAD, alert and oriented x3 HEENT- PERRL, EOMI, non injected sclera, pink conjunctiva, MMM, oropharynx clear Neck- Supple, no thyromegaly Breast- normal symmetry, no nipple inversion,no nipple drainage, no nodules or lumps felt Nodes- no axillary nodes CVS- RRR, no murmur RESP-CTAB ABD-NABS,soft,NT,ND GU- normal external genitalia, vaginal mucosa pink and moist, cervix visualized no growth, no blood form os, no  discharge, no CMT, no ovarian masses, uterus normal size EXT- No  edema Pulses- Radial, DP- 2+        Assessment & Plan:      Problem List Items Addressed This Visit   Tobacco use disorder- S. importance of tobacco cessation she wants to try that Chantix I have given her the starter pack. We discussed the side effects of the medication how to use this. Her quit date has been set.    Relevant Medications      varenicline (CHANTIX) 1 MG tablet   Routine general medical examination at a health care facility - Primary   Relevant Orders- Pap smear done , we are out of the tetanus booster therefore this will need to be done at the next visit       CBC with Differential (Completed)      Comprehensive metabolic panel (Completed)      Lipid panel (Completed)   Obesity, unspecified- will check her fasting labs advise her to work one more dietary changes as well as activity before any prescription drugs will be prescribed.    Relevant Orders      Lipid panel (Completed)    Other Visit Diagnoses   Screening for malignant neoplasm of the cervix        Relevant Orders       PAP, ThinPrep ASCUS Rflx HPV Rflx Type (Completed)    Screen for STD (sexually transmitted disease)        Relevant Orders       WET PREP FOR [place], YEAST, CLUE (Completed)  GC/chlamydia probe amp, genital    Other screening mammogram        Relevant Orders       MM DIGITAL SCREENING BILATERAL       Note: This dictation was prepared with Dragon dictation along with smaller phrase technology. Any transcriptional errors that result from this process are unintentional.

## 2013-08-12 ENCOUNTER — Ambulatory Visit: Payer: 59 | Admitting: Family Medicine

## 2013-08-19 ENCOUNTER — Ambulatory Visit: Payer: 59 | Admitting: Family Medicine

## 2013-12-11 ENCOUNTER — Other Ambulatory Visit: Payer: Self-pay | Admitting: Physician Assistant

## 2013-12-11 ENCOUNTER — Encounter: Payer: Self-pay | Admitting: Family Medicine

## 2013-12-11 ENCOUNTER — Ambulatory Visit (HOSPITAL_COMMUNITY)
Admission: RE | Admit: 2013-12-11 | Discharge: 2013-12-11 | Disposition: A | Discharge status: Home / Self Care | Payer: 59 | Source: Ambulatory Visit | Attending: Physician Assistant | Admitting: Physician Assistant

## 2013-12-11 ENCOUNTER — Ambulatory Visit (INDEPENDENT_AMBULATORY_CARE_PROVIDER_SITE_OTHER): Payer: 59 | Admitting: Physician Assistant

## 2013-12-11 ENCOUNTER — Other Ambulatory Visit: Payer: Self-pay | Admitting: Family Medicine

## 2013-12-11 ENCOUNTER — Encounter: Payer: Self-pay | Admitting: Physician Assistant

## 2013-12-11 VITALS — BP 122/86 | HR 96 | Temp 98.0°F | Resp 20 | Wt 197.0 lb

## 2013-12-11 DIAGNOSIS — B9689 Other specified bacterial agents as the cause of diseases classified elsewhere: Secondary | ICD-10-CM

## 2013-12-11 DIAGNOSIS — R51 Headache: Secondary | ICD-10-CM

## 2013-12-11 DIAGNOSIS — A499 Bacterial infection, unspecified: Secondary | ICD-10-CM

## 2013-12-11 DIAGNOSIS — B3731 Acute candidiasis of vulva and vagina: Secondary | ICD-10-CM

## 2013-12-11 DIAGNOSIS — N760 Acute vaginitis: Secondary | ICD-10-CM

## 2013-12-11 DIAGNOSIS — B373 Candidiasis of vulva and vagina: Secondary | ICD-10-CM

## 2013-12-11 LAB — WET PREP FOR TRICH, YEAST, CLUE: Trich, Wet Prep: NONE SEEN

## 2013-12-11 MED ORDER — FLUCONAZOLE 150 MG PO TABS: 150.0000 mg | ORAL_TABLET | Freq: Once | ORAL | Status: DC

## 2013-12-11 MED ORDER — METRONIDAZOLE 500 MG PO TABS: 500.0000 mg | ORAL_TABLET | Freq: Two times a day (BID) | ORAL | Status: DC

## 2013-12-11 NOTE — Progress Notes (Signed)
Patient ID: Susan King MRN: [id number], DOB: Dec 23, 1970, 43 y.o. Date of Encounter: @DATE @  Chief Complaint:  Chief Complaint  Patient presents with  . bad headache x 3 days    hurts all over  . c/o vaginal itching    x 3 days following oral sex    HPI: 43 y.o. year old [place]  female  presents with above complaint.  She states that her headache started on Tuesday 12/09/2013. Says that on that day she took ibuprofen and slept all day and the headache eventually eased off and resolved. On Wednesday 12/10/2013 she was able to go to work and she felt fine throughout the day. However that night headache returned. She took ibuprofen and slept-- off and on through the night. This morning headache was severe. Says that it was a 10/10 and she was crying --because the pain was so severe.  Says that she has taken ibuprofen and the pain is now 6/10. Says it is a throbbing headache across entire bilateral frontal head.  She states that she has no history of headaches. Has no history of migraine headaches. She states that she has been experiencing no increased amount of stress at all compared to just general usual day-to-day. Reports that she has had no trauma and no injury. Is having no nasal congestion and no mucus from the nose and no rhinorrhea. Has been having no photophobia and no nausea no vomiting. Has noticed no neurologic changes/deficits. No weakness in any extremity, no abnormal gait, no slurred speech.  She also has complaints of vaginal irritation and discharge.   Past Medical History  Diagnosis Date  . HSV-2 (herpes simplex virus 2) infection      Home Meds: Outpatient Prescriptions Prior to Visit  Medication Sig Dispense Refill  . acetaminophen (TYLENOL) 500 MG tablet Take 1,000 mg by mouth every 6 (six) hours as needed for pain.      Marland Kitchen ibuprofen (ADVIL,MOTRIN) 200 MG tablet Take 400 mg by mouth every 6 (six) hours as needed for pain (for pain.).      Marland Kitchen valACYclovir  (VALTREX) 1000 MG tablet Take 1,000 mg by mouth 2 (two) times daily.      . norethindrone (ORTHO MICRONOR) 0.35 MG tablet Take 1 tablet (0.35 mg total) by mouth daily.  1 Package  6  . varenicline (CHANTIX) 1 MG tablet Take 1 mg by mouth 2 (two) times daily.       No facility-administered medications prior to visit.    Allergies: No Known Allergies  History   Social History  . Marital Status: Single    Spouse Name: N/A    Number of Children: N/A  . Years of Education: N/A   Occupational History  . Not on file.   Social History Main Topics  . Smoking status: Current Every Day Smoker -- 0.15 packs/day    Types: Cigarettes  . Smokeless tobacco: Never Used  . Alcohol Use: Yes     Comment: occas.  . Drug Use: No  . Sexual Activity: Not on file   Other Topics Concern  . Not on file   Social History Narrative  . No narrative on file    No family history on file.   Review of Systems:  See HPI for pertinent ROS. All other ROS negative.    Physical Exam: Blood pressure 122/86, pulse 96, temperature 98 F (36.7 C), temperature source Oral, resp. rate 20, weight 197 lb (89.359 kg), last menstrual period 11/01/2013., Body mass  index is 33.8 kg/(m^2). General: WNWD AAF. Appears in no acute distress. Head: Normocephalic, atraumatic, eyes without discharge, sclera non-icteric, nares are without discharge. Bilateral auditory canals clear, TM's are without perforation, pearly grey and translucent with reflective cone of light bilaterally. Oral cavity moist, posterior pharynx without exudate, erythema, peritonsillar abscess. No tenderness with percussion of frontal and maxillary sinuses bilaterally.  Neck: Supple. No thyromegaly. No lymphadenopathy. She does have pain with palpation of the right trapezius/right neck. However this pain is not the same pain as the frontal headache she is experiencing. Lungs: Clear bilaterally to auscultation without wheezes, rales, or rhonchi. Breathing is  unlabored. Heart: RRR with S1 S2. No murmurs, rubs, or gallops. Musculoskeletal:  Strength and tone normal for age. Pelvic: External genitalia normal. Vaginal mucosa normal. Cervix normal. There is moderate amount of pasty white discharge present. Bimanual exam is normal with no cervical motion tenderness and no mass. Extremities/Skin: Warm and dry.  Neuro: Alert and oriented X 3. Moves all extremities spontaneously. Gait is normal. CNII-XII grossly in tact. 5/5 strength in all 4 extremities.  Psych:  Responds to questions appropriately with a normal affect.   Results for orders placed in visit on 12/11/13  GC/CHLAMYDIA PROBE AMP      Result Value Ref Range   CT Probe RNA NEGATIVE     GC Probe RNA NEGATIVE    WET PREP FOR TRICH, YEAST, CLUE      Result Value Ref Range   Yeast Wet Prep HPF POC MOD (*) NONE SEEN   Trich, Wet Prep NONE SEEN  NONE SEEN   Clue Cells Wet Prep HPF POC MOD (*) NONE SEEN   WBC, Wet Prep HPF POC FEW  NONE SEEN     ASSESSMENT AND PLAN:  43 y.o. year old female with  1. Headache(784.0) - CT Head W Contrast; Future I will followup with patient when we get results of head CT.  2. Vaginitis and vulvovaginitis - GC/Chlamydia Probe Amp - WET PREP FOR TRICH, YEAST, CLUE  3. BV (bacterial vaginosis) - metroNIDAZOLE (FLAGYL) 500 MG tablet; Take 1 tablet (500 mg total) by mouth 2 (two) times daily.  Dispense: 14 tablet; Refill: 0  4. Vaginal candidiasis - fluconazole (DIFLUCAN) 150 MG tablet; Take 1 tablet (150 mg total) by mouth once. As Directed  Dispense: 2 tablet; Refill: 0 Take one of the Diflucan now and take the second at the Diflucan to 7 days. Also encouraged her to be yogurt and take probiotic.   Signed, [address] [place], [place], [facility] 12/11/2013 10:56 AM

## 2013-12-12 LAB — GC/CHLAMYDIA PROBE AMP
CT Probe RNA: NEGATIVE
GC Probe RNA: NEGATIVE

## 2013-12-15 ENCOUNTER — Ambulatory Visit (HOSPITAL_COMMUNITY): Payer: 59

## 2013-12-17 NOTE — Progress Notes (Signed)
Patient had office visit with complaint of headache. MRI brain was ordered but she has not gone for the test. Call patient and verify whether headache has resolved.

## 2013-12-23 ENCOUNTER — Ambulatory Visit: Payer: 59 | Admitting: Family Medicine

## 2013-12-23 ENCOUNTER — Ambulatory Visit (INDEPENDENT_AMBULATORY_CARE_PROVIDER_SITE_OTHER): Payer: 59 | Admitting: Family Medicine

## 2013-12-23 ENCOUNTER — Other Ambulatory Visit: Payer: Self-pay | Admitting: Family Medicine

## 2013-12-23 ENCOUNTER — Encounter: Payer: Self-pay | Admitting: Family Medicine

## 2013-12-23 VITALS — BP 118/74 | HR 78 | Temp 98.2°F | Resp 16 | Ht 64.0 in | Wt 199.0 lb

## 2013-12-23 DIAGNOSIS — N6321 Unspecified lump in the left breast, upper outer quadrant: Secondary | ICD-10-CM

## 2013-12-23 DIAGNOSIS — N644 Mastodynia: Secondary | ICD-10-CM

## 2013-12-23 DIAGNOSIS — N63 Unspecified lump in breast: Secondary | ICD-10-CM

## 2013-12-23 NOTE — Patient Instructions (Signed)
Mammogram scheduled  F/U pending results

## 2013-12-23 NOTE — Progress Notes (Signed)
Patient ID: Susan King, female   DOB: 1970/08/24, 43 y.o.   MRN: [id number]   Subjective:    Patient ID: Susan King, female    DOB: 05-15-1970, 43 y.o.   MRN: [id number]  Patient presents for Breast Mass  patient here with a lump to her left breast that she noticed 3 days ago. Her sister did pass away from breast cancer disease. She's not had a mammogram. This is been scheduled couple times before but she has not and to have the procedure done. She does remember that she was moving some boxes with work was not sure if that was the cause of breast pain but then she actually felt a lump beneath her axilla and also a sore spot towards the center of of her chest wall    Review Of Systems:  GEN- denies fatigue, fever, weight loss,weakness, recent illness HEENT- denies eye drainage, change in vision, nasal discharge, CVS- denies chest pain, palpitations RESP- denies SOB, cough, wheeze ABD- denies N/V, change in stools, abd pain GU- denies dysuria, hematuria, dribbling, incontinence MSK- denies joint pain, muscle aches, injury Neuro- denies headache, dizziness, syncope, seizure activity       Objective:    BP 118/74  Pulse 78  Temp(Src) 98.2 F (36.8 C) (Oral)  Resp 16  Ht 5\' 4"  (1.626 m)  Wt 199 lb (90.266 kg)  BMI 34.14 kg/m2  LMP 11/01/2013 GEN- NAD, alert and oriented x 3 Breast- normal symmetry, no nipple inversion,no nipple drainage, + small nodular density 2 oclock position left breast, tenderness with bruising of skin near 9 o clock postion but no nodule felt, fibrodense breast  Nodes- no axillary nodes Psych- very anxious          Assessment & Plan:      Problem List Items Addressed This Visit   None    Visit Diagnoses   Breast lump on left side at 2 o'clock position    -  Primary    Diagnostic mammogram, 1st degree family member- sister died from breast cancer, no screening mammogram for past year     Relevant Orders       MM Digital Diagnostic Bilat    Breast pain        Relevant Orders       MM Digital Diagnostic Bilat       Note: This dictation was prepared with Dragon dictation along with smaller phrase technology. Any transcriptional errors that result from this process are unintentional.

## 2013-12-24 ENCOUNTER — Other Ambulatory Visit: Payer: 59

## 2013-12-26 ENCOUNTER — Other Ambulatory Visit: Payer: Self-pay | Admitting: *Deleted

## 2013-12-26 ENCOUNTER — Ambulatory Visit
Admission: RE | Admit: 2013-12-26 | Discharge: 2013-12-26 | Disposition: A | Discharge status: Home / Self Care | Payer: 59 | Source: Ambulatory Visit | Attending: Family Medicine | Admitting: Family Medicine

## 2013-12-26 DIAGNOSIS — N644 Mastodynia: Secondary | ICD-10-CM

## 2013-12-26 DIAGNOSIS — N6321 Unspecified lump in the left breast, upper outer quadrant: Secondary | ICD-10-CM

## 2013-12-26 MED ORDER — NAPROXEN 500 MG PO TABS: 500.0000 mg | ORAL_TABLET | Freq: Two times a day (BID) | ORAL | Status: DC

## 2014-04-29 ENCOUNTER — Ambulatory Visit (INDEPENDENT_AMBULATORY_CARE_PROVIDER_SITE_OTHER): Payer: 59 | Admitting: Family Medicine

## 2014-04-29 ENCOUNTER — Encounter: Payer: Self-pay | Admitting: Family Medicine

## 2014-04-29 VITALS — BP 118/70 | HR 68 | Temp 98.4°F | Resp 12 | Ht 64.0 in | Wt 195.0 lb

## 2014-04-29 DIAGNOSIS — B354 Tinea corporis: Secondary | ICD-10-CM | POA: Diagnosis not present

## 2014-04-29 DIAGNOSIS — N898 Other specified noninflammatory disorders of vagina: Secondary | ICD-10-CM

## 2014-04-29 LAB — WET PREP FOR TRICH, YEAST, CLUE
Trich, Wet Prep: NONE SEEN
Yeast Wet Prep HPF POC: NONE SEEN

## 2014-04-29 MED ORDER — CLOTRIMAZOLE-BETAMETHASONE 1-0.05 % EX CREA: 1.0000 "application " | TOPICAL_CREAM | Freq: Two times a day (BID) | CUTANEOUS | Status: DC

## 2014-04-29 NOTE — Patient Instructions (Signed)
We will cal with results Use cream twice a day on inner thigh until clear F/U as needed

## 2014-04-29 NOTE — Progress Notes (Signed)
Patient ID: Susan King, female   DOB: 1970-12-26, 44 y.o.   MRN: [id number]   Subjective:    Patient ID: Susan King, female    DOB: [birth year], 44 y.o.   MRN: [id number]  Patient presents for Irritation to R upper thigh  Here with a rash on her inner right thigh she notes that over the past week. She stated initially was just a dry patch however it started to run of now it is quite pruritic she's not had any drainage. She did try using some kind of fungal medicine that her son had for a ringworm but she states that it burned and itched. She also complains of an odor in the vaginal region she's had some mild discharge in the past few days. She does not remember changing her soap or detergent recently   Review Of Systems:  GEN- denies fatigue, fever, weight loss,weakness, recent illness HEENT- denies eye drainage, change in vision, nasal discharge, CVS- denies chest pain, palpitations RESP- denies SOB, cough, wheeze ABD- denies N/V, change in stools, abd pain GU- denies dysuria, hematuria, dribbling, incontinence MSK- denies joint pain, muscle aches, injury Neuro- denies headache, dizziness, syncope, seizure activity       Objective:    BP 118/70 mmHg  Pulse 68  Temp(Src) 98.4 F (36.9 C) (Oral)  Resp 12  Ht 5\' 4"  (1.626 m)  Wt 195 lb (88.451 kg)  BMI 33.46 kg/m2  LMP 04/21/2014 (Approximate) GEN- NAD, alert and oriented x3 GU- normal external genitalia, vaginal mucosa pink and moist, cervix visualized no growth, no blood form os, minimal thin clear discharge, no CMT, no ovarian masses, uterus normal size Skin- inner right thigh large raised scaley circular  hyperpigmented lesions 2 smaller dime size lesions around        Assessment & Plan:      Problem List Items Addressed This Visit    None    Visit Diagnoses    Vaginal discharge    -  Primary    Wet prep neg, GC pending, with habitus she has some sweating in the vaginal/inguinal region    Relevant Orders    WET  PREP FOR [place], YEAST, CLUE (Completed)    GC/Chlamydia Probe Amp    Tinea corporis        Lotrisone to leg BID until clear    Relevant Medications    LOTRISONE 1-0.05 % EX CREA       Note: This dictation was prepared with Dragon dictation along with smaller phrase technology. Any transcriptional errors that result from this process are unintentional.

## 2014-04-30 LAB — GC/CHLAMYDIA PROBE AMP
CT Probe RNA: NEGATIVE
GC Probe RNA: NEGATIVE

## 2014-05-05 ENCOUNTER — Encounter: Payer: Self-pay | Admitting: Family Medicine

## 2014-05-18 ENCOUNTER — Ambulatory Visit (INDEPENDENT_AMBULATORY_CARE_PROVIDER_SITE_OTHER): Payer: 59 | Admitting: Physician Assistant

## 2014-05-18 ENCOUNTER — Encounter: Payer: Self-pay | Admitting: Family Medicine

## 2014-05-18 ENCOUNTER — Encounter: Payer: Self-pay | Admitting: Physician Assistant

## 2014-05-18 VITALS — BP 94/60 | HR 88 | Temp 99.0°F | Resp 18 | Wt 191.0 lb

## 2014-05-18 DIAGNOSIS — B349 Viral infection, unspecified: Secondary | ICD-10-CM

## 2014-05-18 DIAGNOSIS — J029 Acute pharyngitis, unspecified: Secondary | ICD-10-CM | POA: Diagnosis not present

## 2014-05-18 DIAGNOSIS — B9789 Other viral agents as the cause of diseases classified elsewhere: Secondary | ICD-10-CM

## 2014-05-18 DIAGNOSIS — J988 Other specified respiratory disorders: Secondary | ICD-10-CM

## 2014-05-18 DIAGNOSIS — R509 Fever, unspecified: Secondary | ICD-10-CM

## 2014-05-18 LAB — INFLUENZA A AND B
Inflenza A Ag: NEGATIVE
Influenza B Ag: NEGATIVE

## 2014-05-18 LAB — RAPID STREP SCREEN (MED CTR MEBANE ONLY): Streptococcus, Group A Screen (Direct): NEGATIVE

## 2014-05-18 NOTE — Progress Notes (Signed)
    Patient ID: Bennye Nix MRN: [id number], DOB: 03-24-1970, 44 y.o. Date of Encounter: 05/18/2014, 10:57 AM    Chief Complaint:  Chief Complaint  Patient presents with  . sick x 3 days    sore throat, chills, weak, cold, headache     HPI: 44 y.o. year old AA female reports that she has been sick for 3 days. Reports that she has had some sore throat runny nose bad headache body aches and feels cold. Says her whole body including legs have been hurting. Says she's been in the bed for the last couple days. Taking Alka-Seltzer plus which seems to be helping the sore throat and headache some. No known sick contacts at home etc.     Home Meds:   Outpatient Prescriptions Prior to Visit  Medication Sig Dispense Refill  . acetaminophen (TYLENOL) 500 MG tablet Take 1,000 mg by mouth every 6 (six) hours as needed for pain.    . clotrimazole-betamethasone (LOTRISONE) cream Apply 1 application topically 2 (two) times daily. 30 g 0  . ibuprofen (ADVIL,MOTRIN) 200 MG tablet Take 400 mg by mouth every 6 (six) hours as needed for pain (for pain.).    Marland Kitchen valACYclovir (VALTREX) 1000 MG tablet Take 1,000 mg by mouth 2 (two) times daily.    . naproxen (NAPROSYN) 500 MG tablet Take 1 tablet (500 mg total) by mouth 2 (two) times daily with a meal. 30 tablet 0   No facility-administered medications prior to visit.    Allergies: No Known Allergies    Review of Systems: See HPI for pertinent ROS. All other ROS negative.    Physical Exam: Blood pressure 94/60, pulse 88, temperature 99 F (37.2 C), temperature source Oral, resp. rate 18, weight 191 lb (86.637 kg), last menstrual period 04/21/2014., Body mass index is 32.77 kg/(m^2). General:  AAF. Appears in no acute distress. HEENT: Normocephalic, atraumatic, eyes without discharge, sclera non-icteric, nares are without discharge. Bilateral auditory canals obstructed with cerumen. Oral cavity moist, posterior pharynx without exudate, peritonsillar  abscess. Posterior pharynx with minimal erythema. Neck: Supple. No thyromegaly. No lymphadenopathy. Lungs: Clear bilaterally to auscultation without wheezes, rales, or rhonchi. Breathing is unlabored. Heart: Regular rhythm. No murmurs, rubs, or gallops. Msk:  Strength and tone normal for age. Extremities/Skin: Warm and dry. Neuro: Alert and oriented X 3. Moves all extremities spontaneously. Gait is normal. CNII-XII grossly in tact. Psych:  Responds to questions appropriately with a normal affect.   Influenza test is negative Rapid strep test is negative   ASSESSMENT AND PLAN:  44 y.o. year old female with    1. Viral respiratory infection Symptomatic management. Can use Tylenol or Motrin as needed for fever and aches and pains. Can use was in just and spray as needed for sore throat. Use over-the-counter decongestants and cough medications if needed. Give note to be out of work Monday Tuesday Wednesday Thursday. We'll plan for return to work Friday. If she is not significantly better Wednesday evening, then she is to go ahead and call me so that we can plan accordingly.    1. Fever and chills - Rapid strep screen - Influenza a and b  2. Sorethroat - Rapid strep screen - Influenza a and b  Cerumen--I told her to get over-the-counter drops to clear wax from her ears.  [address] [place], [place], [facility] 05/18/2014 10:57 AM

## 2014-06-03 ENCOUNTER — Encounter: Payer: Self-pay | Admitting: Family Medicine

## 2014-06-23 ENCOUNTER — Encounter: Payer: Self-pay | Admitting: Family Medicine

## 2014-06-23 ENCOUNTER — Ambulatory Visit (INDEPENDENT_AMBULATORY_CARE_PROVIDER_SITE_OTHER): Payer: 59 | Admitting: Family Medicine

## 2014-06-23 VITALS — BP 130/76 | HR 78 | Temp 98.2°F | Resp 16 | Ht 64.0 in | Wt 196.0 lb

## 2014-06-23 DIAGNOSIS — N898 Other specified noninflammatory disorders of vagina: Secondary | ICD-10-CM | POA: Diagnosis not present

## 2014-06-23 DIAGNOSIS — R350 Frequency of micturition: Secondary | ICD-10-CM | POA: Diagnosis not present

## 2014-06-23 DIAGNOSIS — F17200 Nicotine dependence, unspecified, uncomplicated: Secondary | ICD-10-CM

## 2014-06-23 DIAGNOSIS — E785 Hyperlipidemia, unspecified: Secondary | ICD-10-CM

## 2014-06-23 DIAGNOSIS — N926 Irregular menstruation, unspecified: Secondary | ICD-10-CM | POA: Diagnosis not present

## 2014-06-23 DIAGNOSIS — Z0000 Encounter for general adult medical examination without abnormal findings: Secondary | ICD-10-CM | POA: Diagnosis not present

## 2014-06-23 DIAGNOSIS — Z720 Tobacco use: Secondary | ICD-10-CM | POA: Diagnosis not present

## 2014-06-23 DIAGNOSIS — E669 Obesity, unspecified: Secondary | ICD-10-CM | POA: Diagnosis not present

## 2014-06-23 DIAGNOSIS — Z3200 Encounter for pregnancy test, result unknown: Secondary | ICD-10-CM | POA: Diagnosis not present

## 2014-06-23 DIAGNOSIS — Z124 Encounter for screening for malignant neoplasm of cervix: Secondary | ICD-10-CM

## 2014-06-23 LAB — FSH/LH
FSH: 4.4 m[IU]/mL
LH: 5.2 m[IU]/mL

## 2014-06-23 LAB — URINALYSIS, ROUTINE W REFLEX MICROSCOPIC
BILIRUBIN URINE: NEGATIVE
GLUCOSE, UA: NEGATIVE mg/dL
Hgb urine dipstick: NEGATIVE
KETONES UR: NEGATIVE mg/dL
Leukocytes, UA: NEGATIVE
Nitrite: NEGATIVE
PROTEIN: NEGATIVE mg/dL
Specific Gravity, Urine: 1.025 (ref 1.005–1.030)
Urobilinogen, UA: 1 mg/dL (ref 0.0–1.0)
pH: 7 (ref 5.0–8.0)

## 2014-06-23 LAB — WET PREP FOR TRICH, YEAST, CLUE
TRICH WET PREP: NONE SEEN
YEAST WET PREP: NONE SEEN

## 2014-06-23 LAB — LIPID PANEL
Cholesterol: 171 mg/dL (ref 0–200)
HDL: 46 mg/dL (ref >=46)
LDL Cholesterol: 96 mg/dL (ref 0–99)
Total CHOL/HDL Ratio: 3.7 Ratio
Triglycerides: 143 mg/dL (ref <150)
VLDL: 29 mg/dL (ref 0–40)

## 2014-06-23 LAB — CBC WITH DIFFERENTIAL/PLATELET
Basophils Absolute: 0.1 10*3/uL (ref 0.0–0.1)
Basophils Relative: 1 % (ref 0–1)
EOS ABS: 0.2 10*3/uL (ref 0.0–0.7)
Eosinophils Relative: 3 % (ref 0–5)
HEMATOCRIT: 39.6 % (ref 36.0–46.0)
Hemoglobin: 13.3 g/dL (ref 12.0–15.0)
LYMPHS ABS: 2.5 10*3/uL (ref 0.7–4.0)
Lymphocytes Relative: 41 % (ref 12–46)
MCH: 28.1 pg (ref 26.0–34.0)
MCHC: 33.6 g/dL (ref 30.0–36.0)
MCV: 83.7 fL (ref 78.0–100.0)
MONO ABS: 0.5 10*3/uL (ref 0.1–1.0)
MONOS PCT: 8 % (ref 3–12)
MPV: 10.8 fL (ref 8.6–12.4)
NEUTROS PCT: 47 % (ref 43–77)
Neutro Abs: 2.8 10*3/uL (ref 1.7–7.7)
Platelets: 244 10*3/uL (ref 150–400)
RBC: 4.73 MIL/uL (ref 3.87–5.11)
RDW: 13.8 % (ref 11.5–15.5)
WBC: 6 10*3/uL (ref 4.0–10.5)

## 2014-06-23 LAB — COMPREHENSIVE METABOLIC PANEL
ALT: 20 U/L (ref 0–35)
AST: 13 U/L (ref 0–37)
Albumin: 4.2 g/dL (ref 3.5–5.2)
Alkaline Phosphatase: 75 U/L (ref 39–117)
BUN: 12 mg/dL (ref 6–23)
CO2: 28 mEq/L (ref 19–32)
Calcium: 9.5 mg/dL (ref 8.4–10.5)
Chloride: 102 mEq/L (ref 96–112)
Creat: 0.76 mg/dL (ref 0.50–1.10)
Glucose, Bld: 84 mg/dL (ref 70–99)
Potassium: 4.7 mEq/L (ref 3.5–5.3)
SODIUM: 139 meq/L (ref 135–145)
Total Bilirubin: 0.3 mg/dL (ref 0.2–1.2)
Total Protein: 7.4 g/dL (ref 6.0–8.3)

## 2014-06-23 LAB — TSH: TSH: 1.312 u[IU]/mL (ref 0.350–4.500)

## 2014-06-23 LAB — PREGNANCY, URINE: PREG TEST UR: NEGATIVE

## 2014-06-23 MED ORDER — IBUPROFEN 800 MG PO TABS: 800.0000 mg | ORAL_TABLET | Freq: Three times a day (TID) | ORAL | Status: DC | PRN

## 2014-06-23 MED ORDER — CLOTRIMAZOLE-BETAMETHASONE 1-0.05 % EX CREA: 1.0000 "application " | TOPICAL_CREAM | Freq: Two times a day (BID) | CUTANEOUS | Status: DC

## 2014-06-23 NOTE — Progress Notes (Signed)
Patient ID: Susan King, female   DOB: 01/07/1971, 44 y.o.   MRN: [id number]   Subjective:    Patient ID: Susan King, female    DOB: 27-Apr-1970, 44 y.o.   MRN: [id number]  Patient presents for CPE with PAP and Urinary Frequency  patient here for complete physical exam. Her last Pap smear was last year was normal. Her mammogram is up-to-date. She is sexually active with one partner however her menstrual cycle has been irregular for many years and she thought she may have been pregnant. Today however she started to bleed. She's also had some mild vaginal discharge as well as urinary frequency for the past week where she would get up 4-5 times a day to urinate she's not had any dysuria abdominal pain or back pain associated.  She was in recent antibiotics for tooth infection, this in turn worsned the rash I diagnosed as tinea corporis and she never picked up the cream to treat this.     Review Of Systems:  GEN- denies fatigue, fever, weight loss,weakness, recent illness HEENT- denies eye drainage, change in vision, nasal discharge, CVS- denies chest pain, palpitations RESP- denies SOB, cough, wheeze ABD- denies N/V, change in stools, abd pain GU- denies dysuria, hematuria, dribbling, incontinence MSK- denies joint pain, muscle aches, injury Neuro- denies headache, dizziness, syncope, seizure activity       Objective:    BP 130/76 mmHg  Pulse 78  Temp(Src) 98.2 F (36.8 C) (Oral)  Resp 16  Ht 5\' 4"  (1.626 m)  Wt 196 lb (88.905 kg)  BMI 33.63 kg/m2  LMP  (Approximate) GEN- NAD, alert and oriented x3 HEENT- PERRL, EOMI, non injected sclera, pink conjunctiva, MMM, oropharynx clear Neck- Supple, no thyromegaly Breast- normal symmetry, no nipple inversion,no nipple drainage, no nodules or lumps felt Nodes- no axillary nodes CVS- RRR, no murmur RESP-CTAB ABD-NABS,soft,NT,ND,no CVA tenderness GU- normal external genitalia, vaginal mucosa pink and moist, cervix visualized no growth,  + blood form os, minimal thin clear discharge, no CMT, no ovarian masses, uterus normal size Skin- inner right thigh large raised scaley circular  hyperpigmented lesions approx 3cm,  2 smaller dime size lesions on left side EXT- No edema Pulses- Radial, DP- 2+        Assessment & Plan:      Problem List Items Addressed This Visit      Unprioritized   Tobacco use disorder   Obesity    Other Visit Diagnoses    Frequency of urination    -  Primary    Check glucose with obesity, no sign of infection, check renal function    Relevant Orders    Urinalysis, Routine w reflex microscopic (Completed)    Pregnancy, urine (Completed)    Encounter for pregnancy test, result unknown        Relevant Orders    Urinalysis, Routine w reflex microscopic (Completed)    Pregnancy, urine (Completed)    Routine general medical examination at a health care facility        CPE done, PAP Smear if normal repeat in 3 years as 2 normal exams    Relevant Orders    CBC with Differential/Platelet    Comprehensive metabolic panel    Pap smear for cervical cancer screening        Relevant Orders    PAP, ThinPrep ASCUS Rflx HPV Rflx Type    Mild hyperlipidemia        Relevant Orders    Lipid panel  Vaginal discharge        Wet prep done, UA neg for infection, u preg negative    Relevant Orders    WET PREP FOR [place], YEAST, CLUE (Completed)    Irregular menses        Check labs, has had irregular menses, declines OCP, initially she talked about BTL    Relevant Orders    TSH    FSH/LH       Note: This dictation was prepared with Dragon dictation along with smaller phrase technology. Any transcriptional errors that result from this process are unintentional.

## 2014-06-23 NOTE — Patient Instructions (Signed)
I recommend eye visit once a year I recommend dental visit every 6 months Goal is to  Exercise 30 minutes 5 days a week We will send a letter with lab results  F/U pending results of labs  Fat and Cholesterol Control Diet Fat and cholesterol levels in your blood and organs are influenced by your diet. High levels of fat and cholesterol may lead to diseases of the heart, small and large blood vessels, gallbladder, liver, and pancreas. CONTROLLING FAT AND CHOLESTEROL WITH DIET Although exercise and lifestyle factors are important, your diet is key. That is because certain foods are known to raise cholesterol and others to lower it. The goal is to balance foods for their effect on cholesterol and more importantly, to replace saturated and trans fat with other types of fat, such as monounsaturated fat, polyunsaturated fat, and omega-3 fatty acids. On average, a person should consume no more than 15 to 17 g of saturated fat daily. Saturated and trans fats are considered "bad" fats, and they will raise LDL cholesterol. Saturated fats are primarily found in animal products such as meats, butter, and cream. However, that does not mean you need to give up all your favorite foods. Today, there are good tasting, low-fat, low-cholesterol substitutes for most of the things you like to eat. Choose low-fat or nonfat alternatives. Choose round or loin cuts of red meat. These types of cuts are lowest in fat and cholesterol. Chicken (without the skin), fish, veal, and ground [place] breast are great choices. Eliminate fatty meats, such as hot dogs and salami. Even shellfish have little or no saturated fat. Have a 3 oz (85 g) portion when you eat lean meat, poultry, or fish. Trans fats are also called "partially hydrogenated oils." They are oils that have been scientifically manipulated so that they are solid at room temperature resulting in a longer shelf life and improved taste and texture of foods in which they are added.  Trans fats are found in stick margarine, some tub margarines, cookies, crackers, and baked goods.  When baking and cooking, oils are a great substitute for butter. The monounsaturated oils are especially beneficial since it is believed they lower LDL and raise HDL. The oils you should avoid entirely are saturated tropical oils, such as coconut and palm.  Remember to eat a lot from food groups that are naturally free of saturated and trans fat, including fish, fruit, vegetables, beans, grains (barley, rice, couscous, bulgur wheat), and pasta (without cream sauces).  IDENTIFYING FOODS THAT LOWER FAT AND CHOLESTEROL  Soluble fiber may lower your cholesterol. This type of fiber is found in fruits such as apples, vegetables such as broccoli, potatoes, and carrots, legumes such as beans, peas, and lentils, and grains such as barley. Foods fortified with plant sterols (phytosterol) may also lower cholesterol. You should eat at least 2 g per day of these foods for a cholesterol lowering effect.  Read package labels to identify low-saturated fats, trans fat free, and low-fat foods at the supermarket. Select cheeses that have only 2 to 3 g saturated fat per ounce. Use a heart-healthy tub margarine that is free of trans fats or partially hydrogenated oil. When buying baked goods (cookies, crackers), avoid partially hydrogenated oils. Breads and muffins should be made from whole grains (whole-wheat or whole oat flour, instead of "flour" or "enriched flour"). Buy non-creamy canned soups with reduced salt and no added fats.  FOOD PREPARATION TECHNIQUES  Never deep-fry. If you must fry, either stir-fry, which uses  very little fat, or use non-stick cooking sprays. When possible, broil, bake, or roast meats, and steam vegetables. Instead of putting butter or margarine on vegetables, use lemon and herbs, applesauce, and cinnamon (for squash and sweet potatoes). Use nonfat yogurt, salsa, and low-fat dressings for salads.   LOW-SATURATED FAT / LOW-FAT FOOD SUBSTITUTES Meats / Saturated Fat (g)  Avoid: Steak, marbled (3 oz/85 g) / 11 g  Choose: Steak, lean (3 oz/85 g) / 4 g  Avoid: Hamburger (3 oz/85 g) / 7 g  Choose: Hamburger, lean (3 oz/85 g) / 5 g  Avoid: Ham (3 oz/85 g) / 6 g  Choose: Ham, lean cut (3 oz/85 g) / 2.4 g  Avoid: Chicken, with skin, dark meat (3 oz/85 g) / 4 g  Choose: Chicken, skin removed, dark meat (3 oz/85 g) / 2 g  Avoid: Chicken, with skin, light meat (3 oz/85 g) / 2.5 g  Choose: Chicken, skin removed, light meat (3 oz/85 g) / 1 g Dairy / Saturated Fat (g)  Avoid: Whole milk (1 cup) / 5 g  Choose: Low-fat milk, 2% (1 cup) / 3 g  Choose: Low-fat milk, 1% (1 cup) / 1.5 g  Choose: Skim milk (1 cup) / 0.3 g  Avoid: Hard cheese (1 oz/28 g) / 6 g  Choose: Skim milk cheese (1 oz/28 g) / 2 to 3 g  Avoid: Cottage cheese, 4% fat (1 cup) / 6.5 g  Choose: Low-fat cottage cheese, 1% fat (1 cup) / 1.5 g  Avoid: Ice cream (1 cup) / 9 g  Choose: Sherbet (1 cup) / 2.5 g  Choose: Nonfat frozen yogurt (1 cup) / 0.3 g  Choose: Frozen fruit bar / trace  Avoid: Whipped cream (1 tbs) / 3.5 g  Choose: Nondairy whipped topping (1 tbs) / 1 g Condiments / Saturated Fat (g)  Avoid: Mayonnaise (1 tbs) / 2 g  Choose: Low-fat mayonnaise (1 tbs) / 1 g  Avoid: Butter (1 tbs) / 7 g  Choose: Extra light margarine (1 tbs) / 1 g  Avoid: Coconut oil (1 tbs) / 11.8 g  Choose: Olive oil (1 tbs) / 1.8 g  Choose: Corn oil (1 tbs) / 1.7 g  Choose: Safflower oil (1 tbs) / 1.2 g  Choose: Sunflower oil (1 tbs) / 1.4 g  Choose: Soybean oil (1 tbs) / 2.4 g  Choose: Canola oil (1 tbs) / 1 g Document Released: 03/06/2005 Document Revised: 07/01/2012 Document Reviewed: 06/04/2013 Exit[employer] Patient Information 2015 [place], [place]. This information is not intended to replace advice given to you by your health care provider. Make sure you discuss any questions you have with your health care  provider.

## 2014-06-24 ENCOUNTER — Other Ambulatory Visit: Payer: Self-pay | Admitting: *Deleted

## 2014-06-24 LAB — PAP THINPREP ASCUS RFLX HPV RFLX TYPE

## 2014-06-24 MED ORDER — FLUCONAZOLE 150 MG PO TABS: 150.0000 mg | ORAL_TABLET | Freq: Once | ORAL | Status: DC

## 2014-06-24 MED ORDER — METRONIDAZOLE 500 MG PO TABS: 500.0000 mg | ORAL_TABLET | Freq: Two times a day (BID) | ORAL | Status: DC

## 2014-07-06 ENCOUNTER — Encounter: Payer: Self-pay | Admitting: Family Medicine

## 2014-08-05 ENCOUNTER — Encounter: Payer: Self-pay | Admitting: Family Medicine

## 2014-12-04 ENCOUNTER — Telehealth: Payer: Self-pay | Admitting: *Deleted

## 2014-12-04 MED ORDER — VALACYCLOVIR HCL 1 G PO TABS: 1000.0000 mg | ORAL_TABLET | Freq: Two times a day (BID) | ORAL | Status: DC

## 2014-12-04 NOTE — Telephone Encounter (Signed)
Pt called stating she is having a flare up on Herpes and is requesting to get Valtrex, I informed the pt that since it has been awhile since she was seen she would have to make office visit and to also before can schedule will need to pay her balance. Pt got upset with me stating that she should not have to come in to pay a co-pay for her to get a prescribed medication and that co pay would go towards her balance. I informed the pt this was our protocol since we did not prescribe this medication and that she has not been seen for this issue.   Pt is requesting for Dr. Buelah Manis to give her a call since no one else was helping her. I tried to explain to pt to help her understand but she was so upset with the fact that she had to be seen first before

## 2014-12-04 NOTE — Telephone Encounter (Signed)
Call placed to patient. No answer. No VM.   Prescription sent to pharmacy.

## 2014-12-04 NOTE — Telephone Encounter (Signed)
Okay to send in Valtrex 1000mg  BID x 5 days

## 2014-12-07 NOTE — Telephone Encounter (Signed)
Call placed to patient and patient made aware.  

## 2015-01-26 ENCOUNTER — Ambulatory Visit (INDEPENDENT_AMBULATORY_CARE_PROVIDER_SITE_OTHER): Payer: 59 | Admitting: Family Medicine

## 2015-01-26 ENCOUNTER — Encounter: Payer: Self-pay | Admitting: Family Medicine

## 2015-01-26 VITALS — BP 130/62 | HR 82 | Temp 98.6°F | Resp 16 | Ht 64.0 in | Wt 203.0 lb

## 2015-01-26 DIAGNOSIS — R300 Dysuria: Secondary | ICD-10-CM

## 2015-01-26 DIAGNOSIS — R1032 Left lower quadrant pain: Secondary | ICD-10-CM | POA: Diagnosis not present

## 2015-01-26 LAB — URINALYSIS, ROUTINE W REFLEX MICROSCOPIC
BILIRUBIN URINE: NEGATIVE
GLUCOSE, UA: NEGATIVE
HGB URINE DIPSTICK: NEGATIVE
Ketones, ur: NEGATIVE
LEUKOCYTES UA: NEGATIVE
Nitrite: NEGATIVE
PH: 7 (ref 5.0–8.0)
PROTEIN: NEGATIVE
Specific Gravity, Urine: 1.02 (ref 1.001–1.035)

## 2015-01-26 LAB — WET PREP FOR TRICH, YEAST, CLUE
CLUE CELLS WET PREP: NONE SEEN
Trich, Wet Prep: NONE SEEN
Yeast Wet Prep HPF POC: NONE SEEN

## 2015-01-26 MED ORDER — CLOTRIMAZOLE-BETAMETHASONE 1-0.05 % EX CREA: 1.0000 "application " | TOPICAL_CREAM | Freq: Two times a day (BID) | CUTANEOUS | Status: DC

## 2015-01-26 MED ORDER — CIPROFLOXACIN HCL 500 MG PO TABS: 500.0000 mg | ORAL_TABLET | Freq: Two times a day (BID) | ORAL | Status: DC

## 2015-01-26 NOTE — Patient Instructions (Signed)
Take antibiotics Push fluids Take imodium if diarrhea worsens

## 2015-01-26 NOTE — Progress Notes (Signed)
Patient ID: Susan King, female   DOB: 1970/07/30, 44 y.o.   MRN: [id number]   Subjective:    Patient ID: Susan King, female    DOB: 1970/08/02, 44 y.o.   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 She's also had diarrhea multiple times a day for the past week. She denies any fever denies any nausea vomiting. She denies any vaginal discharge but then became wary that she can partially have an STD. She does have 1 monogamous partner. She just finished her menstrual cycle 2 days ago. Did increase her water and has been taken AZO which has helped her symptoms    Review Of Systems:  GEN- denies fatigue, fever, weight loss,weakness, recent illness HEENT- denies eye drainage, change in vision, nasal discharge, CVS- denies chest pain, palpitations RESP- denies SOB, cough, wheeze ABD- denies N/V, change in stools, +abd pain GU- denies dysuria, hematuria, dribbling, incontinence MSK- denies joint pain, muscle aches, injury Neuro- denies headache, dizziness, syncope, seizure activity       Objective:    BP 130/62 mmHg  Pulse 82  Temp(Src) 98.6 F (37 C) (Oral)  Resp 16  Ht 5\' 4"  (1.626 m)  Wt 203 lb (92.08 kg)  BMI 34.83 kg/m2  LMP 01/18/2015 (Approximate) GEN- NAD, alert and oriented x3 HEENT- PERRL, EOMI, non injected sclera, pink conjunctiva, MMM, oropharynx clear CVS- RRR, no murmur RESP-CTAB ABD-NABS,soft,TTP LLQ, no rebound, no guarding GU- normal external genitalia, vaginal mucosa pink and moist, cervix visualized no growth+ blood form os, minimal thin clear discharge, no CMT, no ovarian masses, uterus normal size, mild TTP Left side, near ovary           Assessment & Plan:      Problem List Items Addressed This Visit    None    Visit Diagnoses    Dysuria    -  Primary    Culture pending, symptoms of UTI    Relevant Orders    Urinalysis, Routine w reflex microscopic  (not at [facility]) (Completed)    Urine culture    LLQ pain        Unclear cause, ? enteritis with change in bowels, Urine is clear, but send for culture, vaginal culture negative thus far. Other diff, ovarian cyst, but does not fit the picture of symptoms.start Cipro to cover UTI, enteritis. No improvement CT abd/pelvis    Relevant Orders    GC/Chlamydia Probe Amp (Completed)    WET PREP FOR [place], YEAST, CLUE (Completed)       Note: This dictation was prepared with Dragon dictation along with smaller phrase technology. Any transcriptional errors that result from this process are unintentional.

## 2015-01-27 ENCOUNTER — Encounter: Payer: Self-pay | Admitting: Family Medicine

## 2015-01-27 LAB — GC/CHLAMYDIA PROBE AMP
CT PROBE, AMP APTIMA: NEGATIVE
GC PROBE AMP APTIMA: NEGATIVE

## 2015-01-28 LAB — URINE CULTURE
COLONY COUNT: NO GROWTH
Organism ID, Bacteria: NO GROWTH

## 2015-04-02 ENCOUNTER — Emergency Department (HOSPITAL_COMMUNITY)
Admission: EM | Admit: 2015-04-02 | Discharge: 2015-04-03 | Disposition: A | Discharge status: Home / Self Care | Payer: 59 | Attending: Emergency Medicine | Admitting: Emergency Medicine

## 2015-04-02 DIAGNOSIS — Z792 Long term (current) use of antibiotics: Secondary | ICD-10-CM | POA: Diagnosis not present

## 2015-04-02 DIAGNOSIS — S3992XA Unspecified injury of lower back, initial encounter: Secondary | ICD-10-CM | POA: Diagnosis present

## 2015-04-02 DIAGNOSIS — Y92410 Unspecified street and highway as the place of occurrence of the external cause: Secondary | ICD-10-CM | POA: Diagnosis not present

## 2015-04-02 DIAGNOSIS — S29002A Unspecified injury of muscle and tendon of back wall of thorax, initial encounter: Secondary | ICD-10-CM | POA: Diagnosis not present

## 2015-04-02 DIAGNOSIS — Z8619 Personal history of other infectious and parasitic diseases: Secondary | ICD-10-CM | POA: Insufficient documentation

## 2015-04-02 DIAGNOSIS — F17210 Nicotine dependence, cigarettes, uncomplicated: Secondary | ICD-10-CM | POA: Insufficient documentation

## 2015-04-02 DIAGNOSIS — Z79899 Other long term (current) drug therapy: Secondary | ICD-10-CM | POA: Diagnosis not present

## 2015-04-02 DIAGNOSIS — Y9389 Activity, other specified: Secondary | ICD-10-CM | POA: Diagnosis not present

## 2015-04-02 DIAGNOSIS — Y998 Other external cause status: Secondary | ICD-10-CM | POA: Insufficient documentation

## 2015-04-02 DIAGNOSIS — M7918 Myalgia, other site: Secondary | ICD-10-CM

## 2015-04-02 MED ORDER — ACETAMINOPHEN 325 MG PO TABS
650.0000 mg | ORAL_TABLET | Freq: Once | ORAL | Status: AC
  Administered 2015-04-02: 650 mg via ORAL
  Filled 2015-04-02: qty 2

## 2015-04-02 MED ORDER — METHOCARBAMOL 500 MG PO TABS: 1000.0000 mg | ORAL_TABLET | Freq: Four times a day (QID) | ORAL | Status: DC | PRN

## 2015-04-02 MED ORDER — IBUPROFEN 800 MG PO TABS
800.0000 mg | ORAL_TABLET | Freq: Once | ORAL | Status: AC
  Administered 2015-04-02: 800 mg via ORAL
  Filled 2015-04-02: qty 1
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
## 2015-04-02 NOTE — ED Notes (Signed)
No answer from waiting room.
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
## 2015-06-15 ENCOUNTER — Ambulatory Visit (INDEPENDENT_AMBULATORY_CARE_PROVIDER_SITE_OTHER): Payer: Managed Care, Other (non HMO) | Admitting: Family Medicine

## 2015-06-15 ENCOUNTER — Encounter: Payer: Self-pay | Admitting: Family Medicine

## 2015-06-15 VITALS — BP 110/72 | HR 78 | Temp 98.4°F | Resp 14 | Ht 64.0 in | Wt 201.0 lb

## 2015-06-15 DIAGNOSIS — N898 Other specified noninflammatory disorders of vagina: Secondary | ICD-10-CM | POA: Diagnosis not present

## 2015-06-15 DIAGNOSIS — F17200 Nicotine dependence, unspecified, uncomplicated: Secondary | ICD-10-CM

## 2015-06-15 DIAGNOSIS — E669 Obesity, unspecified: Secondary | ICD-10-CM

## 2015-06-15 DIAGNOSIS — Z1239 Encounter for other screening for malignant neoplasm of breast: Secondary | ICD-10-CM | POA: Diagnosis not present

## 2015-06-15 DIAGNOSIS — Z124 Encounter for screening for malignant neoplasm of cervix: Secondary | ICD-10-CM | POA: Diagnosis not present

## 2015-06-15 DIAGNOSIS — Z1321 Encounter for screening for nutritional disorder: Secondary | ICD-10-CM | POA: Diagnosis not present

## 2015-06-15 DIAGNOSIS — B009 Herpesviral infection, unspecified: Secondary | ICD-10-CM

## 2015-06-15 DIAGNOSIS — Z0000 Encounter for general adult medical examination without abnormal findings: Secondary | ICD-10-CM

## 2015-06-15 DIAGNOSIS — R1030 Lower abdominal pain, unspecified: Secondary | ICD-10-CM

## 2015-06-15 LAB — COMPREHENSIVE METABOLIC PANEL
ALK PHOS: 70 U/L (ref 33–115)
ALT: 21 U/L (ref 6–29)
AST: 14 U/L (ref 10–35)
Albumin: 4 g/dL (ref 3.6–5.1)
BUN: 8 mg/dL (ref 7–25)
CHLORIDE: 104 mmol/L (ref 98–110)
CO2: 24 mmol/L (ref 20–31)
Calcium: 9.2 mg/dL (ref 8.6–10.2)
Creat: 0.79 mg/dL (ref 0.50–1.10)
GLUCOSE: 89 mg/dL (ref 70–99)
POTASSIUM: 4.5 mmol/L (ref 3.5–5.3)
Sodium: 138 mmol/L (ref 135–146)
Total Bilirubin: 0.6 mg/dL (ref 0.2–1.2)
Total Protein: 7.3 g/dL (ref 6.1–8.1)

## 2015-06-15 LAB — CBC WITH DIFFERENTIAL/PLATELET
BASOS ABS: 0.1 10*3/uL (ref 0.0–0.1)
BASOS PCT: 1 % (ref 0–1)
EOS ABS: 0.3 10*3/uL (ref 0.0–0.7)
Eosinophils Relative: 5 % (ref 0–5)
HEMATOCRIT: 39.7 % (ref 36.0–46.0)
Hemoglobin: 13 g/dL (ref 12.0–15.0)
LYMPHS ABS: 2 10*3/uL (ref 0.7–4.0)
LYMPHS PCT: 37 % (ref 12–46)
MCH: 27.5 pg (ref 26.0–34.0)
MCHC: 32.7 g/dL (ref 30.0–36.0)
MCV: 83.9 fL (ref 78.0–100.0)
MPV: 11.3 fL (ref 8.6–12.4)
Monocytes Absolute: 0.4 10*3/uL (ref 0.1–1.0)
Monocytes Relative: 7 % (ref 3–12)
NEUTROS PCT: 50 % (ref 43–77)
Neutro Abs: 2.7 10*3/uL (ref 1.7–7.7)
PLATELETS: 221 10*3/uL (ref 150–400)
RBC: 4.73 MIL/uL (ref 3.87–5.11)
RDW: 14.4 % (ref 11.5–15.5)
WBC: 5.4 10*3/uL (ref 4.0–10.5)

## 2015-06-15 LAB — LIPID PANEL
CHOL/HDL RATIO: 4.2 ratio (ref <=5.0)
Cholesterol: 160 mg/dL (ref 125–200)
HDL: 38 mg/dL — ABNORMAL LOW (ref >=46)
LDL Cholesterol: 96 mg/dL (ref <130)
Triglycerides: 128 mg/dL (ref <150)
VLDL: 26 mg/dL (ref <30)

## 2015-06-15 LAB — WET PREP FOR TRICH, YEAST, CLUE
Clue Cells Wet Prep HPF POC: NONE SEEN
Trich, Wet Prep: NONE SEEN

## 2015-06-15 MED ORDER — VALACYCLOVIR HCL 1 G PO TABS: 1000.0000 mg | ORAL_TABLET | Freq: Two times a day (BID) | ORAL | Status: DC

## 2015-06-15 MED ORDER — NICOTINE 14 MG/24HR TD PT24: 14.0000 mg | MEDICATED_PATCH | Freq: Every day | TRANSDERMAL | Status: DC

## 2015-06-15 MED ORDER — IBUPROFEN 800 MG PO TABS: 800.0000 mg | ORAL_TABLET | Freq: Three times a day (TID) | ORAL | Status: DC | PRN

## 2015-06-15 NOTE — Assessment & Plan Note (Signed)
Counseled on tobacco cessation she smokes 8-10 cigarettes a day and I will start her on nicotine 14mg  patch

## 2015-06-15 NOTE — Assessment & Plan Note (Signed)
We discussed using suppressive therapy she wants to just treat as needed for flares. Her Valtrex was refilled

## 2015-06-15 NOTE — Progress Notes (Signed)
Patient ID: Susan King, female   DOB: 03-May-1970, 45 y.o.   MRN: [id number]    Subjective:    Patient ID: Susan King, female    DOB: 1970-10-23, 45 y.o.   MRN: [id number]  Patient presents for CPE and Vaginal Discharge Patient here for complete physical exam Her last Pap smear was in April 2016 was normal  She is due for mammogram She continues to smoke She is due for fasting labs  She complains of recurrent vaginal discharge but she does not have any odor no itching. She also continues to have these recurrent episodes of lower quadrant pain mostly on the left side but also in the suprapubic region. After our last visit I recommended that she have a CT scan showed recurrence of pain but she thought this is an MRI she is claustrophobic therefore she never called back. She states her pain is so intense that she has to call to work she takes multiple doses of ibuprofen to help with pain. She never has any change in her bowels no nausea vomiting no fever no hematuria.  Review Of Systems:  GEN- denies fatigue, fever, weight loss,weakness, recent illness HEENT- denies eye drainage, change in vision, nasal discharge, CVS- denies chest pain, palpitations RESP- denies SOB, cough, wheeze ABD- denies N/V, change in stools, abd pain GU- denies dysuria, hematuria, dribbling, incontinence MSK- denies joint pain, muscle aches, injury Neuro- denies headache, dizziness, syncope, seizure activity       Objective:    BP 110/72 mmHg  Pulse 78  Temp(Src) 98.4 F (36.9 C) (Oral)  Resp 14  Ht 5\' 4"  (1.626 m)  Wt 201 lb (91.173 kg)  BMI 34.48 kg/m2  LMP 05/21/2015 (Approximate) GEN- NAD, alert and oriented x3 HEENT- PERRL, EOMI, non injected sclera, pink conjunctiva, MMM, oropharynx clear Neck- Supple, no thyromegaly CVS- RRR, no murmur RESP-CTAB Breast- normal symmetry, no nipple inversion,no nipple drainage, no nodules or lumps felt Nodes- no axillary nodes ABD-NABS,soft,NT,ND GU- normal  external genitalia, vaginal mucosa pink and moist, cervix visualized no growth, MILD blood form os, minimal thin clear discharge, no CMT, no ovarian masses, uterus normal size EXT- No edema Pulses- Radial, DP- 2+        Assessment & Plan:     Declines PNA vaccine   Problem List Items Addressed This Visit    Tobacco use disorder    Counseled on tobacco cessation she smokes 8-10 cigarettes a day and I will start her on nicotine 14mg  patch      Relevant Medications   nicotine (NICODERM CQ) 14 mg/24hr patch   Obesity    Continue to work on diet and weight loss      Herpes simplex type 2 infection    We discussed using suppressive therapy she wants to just treat as needed for flares. Her Valtrex was refilled      Relevant Medications   valACYclovir (VALTREX) 1000 MG tablet    Other Visit Diagnoses    Routine general medical examination at a health care facility    -  Primary    CPE done, Mammogram to be done, fasting labs for her wellness screen    Relevant Orders    CBC with Differential/Platelet    Comprehensive metabolic panel    Lipid panel    Breast cancer screening        Relevant Orders    MM DIGITAL SCREENING BILATERAL    Encounter for vitamin deficiency screening        Relevant  Orders    Vitamin D, 25-hydroxy    Vaginal discharge        check cultures today, Wet prep unremarkable    Relevant Orders    WET PREP FOR [facility], YEAST, CLUE (Completed)    GC/Chlamydia Probe Amp    Cervical cancer screening        PAP at pt request    Relevant Orders    PAP, ThinPrep ASCUS Rflx HPV Rflx Type    Lower abdominal pain        recurrent pain, I dount related to BV, recurrent symptoms even without vaginal discharge, no change in bowels. Often missing work due to severe pain, ? pelvic     Relevant Orders    CT Abdomen Pelvis W Contrast       Note: This dictation was prepared with Dragon dictation along with smaller phrase technology. Any transcriptional errors that  result from this process are unintentional.

## 2015-06-15 NOTE — Patient Instructions (Signed)
I recommend eye visit once a year I recommend dental visit every 6 months Goal is to  Exercise 30 minutes 5 days a week We will send a letter with lab results  F/u 1 YEAR OR as needed

## 2015-06-15 NOTE — Assessment & Plan Note (Signed)
Continue to work on diet and weight loss. 

## 2015-06-16 LAB — GC/CHLAMYDIA PROBE AMP
CT Probe RNA: NOT DETECTED
GC PROBE AMP APTIMA: NOT DETECTED

## 2015-06-16 LAB — VITAMIN D 25 HYDROXY (VIT D DEFICIENCY, FRACTURES): VIT D 25 HYDROXY: 10 ng/mL — AB (ref 30–100)

## 2015-06-16 LAB — PAP THINPREP ASCUS RFLX HPV RFLX TYPE

## 2015-06-17 ENCOUNTER — Other Ambulatory Visit: Payer: Self-pay | Admitting: *Deleted

## 2015-06-17 MED ORDER — VITAMIN D (ERGOCALCIFEROL) 1.25 MG (50000 UNIT) PO CAPS: ORAL_CAPSULE | ORAL | Status: DC

## 2015-06-21 ENCOUNTER — Other Ambulatory Visit: Payer: 59

## 2015-06-29 ENCOUNTER — Encounter: Payer: 59 | Admitting: Family Medicine

## 2015-06-29 ENCOUNTER — Other Ambulatory Visit: Payer: 59

## 2015-07-06 ENCOUNTER — Ambulatory Visit: Payer: 59

## 2015-07-20 ENCOUNTER — Other Ambulatory Visit: Payer: Self-pay | Admitting: *Deleted

## 2015-07-20 ENCOUNTER — Ambulatory Visit
Admission: RE | Admit: 2015-07-20 | Discharge: 2015-07-20 | Disposition: A | Discharge status: Home / Self Care | Payer: Managed Care, Other (non HMO) | Source: Ambulatory Visit | Attending: Family Medicine | Admitting: Family Medicine

## 2015-07-20 DIAGNOSIS — N852 Hypertrophy of uterus: Secondary | ICD-10-CM

## 2015-07-20 DIAGNOSIS — R1084 Generalized abdominal pain: Secondary | ICD-10-CM

## 2015-07-20 DIAGNOSIS — R1030 Lower abdominal pain, unspecified: Secondary | ICD-10-CM

## 2015-07-20 MED ORDER — IOPAMIDOL (ISOVUE-300) INJECTION 61%
125.0000 mL | Freq: Once | INTRAVENOUS | Status: AC | PRN
  Administered 2015-07-20: 125 mL via INTRAVENOUS

## 2015-08-10 ENCOUNTER — Ambulatory Visit
Admission: RE | Admit: 2015-08-10 | Discharge: 2015-08-10 | Disposition: A | Discharge status: Home / Self Care | Payer: Managed Care, Other (non HMO) | Source: Ambulatory Visit | Attending: Family Medicine | Admitting: Family Medicine

## 2015-08-10 DIAGNOSIS — R1084 Generalized abdominal pain: Secondary | ICD-10-CM

## 2015-08-10 DIAGNOSIS — N852 Hypertrophy of uterus: Secondary | ICD-10-CM

## 2015-08-10 DIAGNOSIS — Z1239 Encounter for other screening for malignant neoplasm of breast: Secondary | ICD-10-CM

## 2015-08-11 ENCOUNTER — Other Ambulatory Visit: Payer: Self-pay | Admitting: *Deleted

## 2015-08-11 DIAGNOSIS — D259 Leiomyoma of uterus, unspecified: Secondary | ICD-10-CM

## 2015-09-24 ENCOUNTER — Other Ambulatory Visit: Payer: Self-pay | Admitting: Obstetrics and Gynecology

## 2015-09-28 ENCOUNTER — Other Ambulatory Visit: Payer: Self-pay | Admitting: Obstetrics and Gynecology

## 2015-10-04 NOTE — Patient Instructions (Addendum)
Your procedure is scheduled on:  Friday, October 15, 2015  Enter through the Main Entrance of [facility] at:  6:00 AM  Pick up the phone at the desk and dial [phone].  Call this number if you have problems the morning of surgery: [phone].  Remember: Do NOT eat food or drink after:  Midnight Thursday, October 14, 2015  Do NOT smoke the day of surgery.  Take these medicines the morning of surgery with a SIP OF WATER:  Valtrex  Do NOT wear jewelry (body piercing), metal hair clips/bobby pins, make-up, or nail polish. Do NOT wear lotions, powders, or perfumes.  You may wear deodorant. Do NOT shave for 48 hours prior to surgery. Do NOT bring valuables to the hospital. Contacts, dentures, or bridgework may not be worn into surgery.  Leave suitcase in car.  After surgery it may be brought to your room.  For patients admitted to the hospital, checkout time is 11:00 AM the day of discharge.

## 2015-10-05 ENCOUNTER — Encounter (HOSPITAL_COMMUNITY)
Admission: RE | Admit: 2015-10-05 | Discharge: 2015-10-05 | Disposition: A | Discharge status: Home / Self Care | Payer: Managed Care, Other (non HMO) | Source: Ambulatory Visit | Attending: Obstetrics and Gynecology | Admitting: Obstetrics and Gynecology

## 2015-10-05 ENCOUNTER — Encounter (HOSPITAL_COMMUNITY): Payer: Self-pay

## 2015-10-05 DIAGNOSIS — Z029 Encounter for administrative examinations, unspecified: Secondary | ICD-10-CM | POA: Diagnosis not present

## 2015-10-05 HISTORY — DX: Headache, unspecified: R51.9

## 2015-10-05 HISTORY — DX: Cerebral infarction, unspecified: I63.9

## 2015-10-05 HISTORY — DX: Headache: R51

## 2015-10-05 LAB — CBC
HEMATOCRIT: 38.6 % (ref 36.0–46.0)
Hemoglobin: 13 g/dL (ref 12.0–15.0)
MCH: 27.8 pg (ref 26.0–34.0)
MCHC: 33.7 g/dL (ref 30.0–36.0)
MCV: 82.5 fL (ref 78.0–100.0)
Platelets: 217 10*3/uL (ref 150–400)
RBC: 4.68 MIL/uL (ref 3.87–5.11)
RDW: 13.5 % (ref 11.5–15.5)
WBC: 7 10*3/uL (ref 4.0–10.5)

## 2015-10-14 ENCOUNTER — Encounter (HOSPITAL_COMMUNITY): Payer: Self-pay

## 2015-10-14 DIAGNOSIS — R102 Pelvic and perineal pain: Secondary | ICD-10-CM

## 2015-10-14 DIAGNOSIS — G8929 Other chronic pain: Secondary | ICD-10-CM | POA: Diagnosis present

## 2015-10-14 DIAGNOSIS — N8000 Endometriosis of the uterus, unspecified: Secondary | ICD-10-CM | POA: Diagnosis present

## 2015-10-14 MED ORDER — DEXTROSE 5 % IV SOLN
2.0000 g | INTRAVENOUS | Status: AC
  Administered 2015-10-15: 2 g via INTRAVENOUS
  Filled 2015-10-14: qty 2
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
## 2015-10-15 ENCOUNTER — Ambulatory Visit (HOSPITAL_COMMUNITY): Payer: Managed Care, Other (non HMO) | Admitting: Anesthesiology

## 2015-10-15 ENCOUNTER — Encounter (HOSPITAL_COMMUNITY)
Admission: RE | Disposition: A | Discharge status: Home / Self Care | Payer: Self-pay | Source: Ambulatory Visit | Attending: Obstetrics and Gynecology

## 2015-10-15 ENCOUNTER — Encounter (HOSPITAL_COMMUNITY): Payer: Self-pay | Admitting: *Deleted

## 2015-10-15 ENCOUNTER — Ambulatory Visit (HOSPITAL_COMMUNITY)
Admission: RE | Admit: 2015-10-15 | Discharge: 2015-10-16 | Disposition: A | Discharge status: Home / Self Care | Payer: Managed Care, Other (non HMO) | Source: Ambulatory Visit | Attending: Obstetrics and Gynecology | Admitting: Obstetrics and Gynecology

## 2015-10-15 DIAGNOSIS — N838 Other noninflammatory disorders of ovary, fallopian tube and broad ligament: Secondary | ICD-10-CM | POA: Insufficient documentation

## 2015-10-15 DIAGNOSIS — Z8673 Personal history of transient ischemic attack (TIA), and cerebral infarction without residual deficits: Secondary | ICD-10-CM | POA: Diagnosis not present

## 2015-10-15 DIAGNOSIS — F17210 Nicotine dependence, cigarettes, uncomplicated: Secondary | ICD-10-CM | POA: Diagnosis not present

## 2015-10-15 DIAGNOSIS — G8929 Other chronic pain: Secondary | ICD-10-CM | POA: Diagnosis present

## 2015-10-15 DIAGNOSIS — R102 Pelvic and perineal pain: Secondary | ICD-10-CM | POA: Diagnosis not present

## 2015-10-15 DIAGNOSIS — Z6836 Body mass index (BMI) 36.0-36.9, adult: Secondary | ICD-10-CM | POA: Insufficient documentation

## 2015-10-15 DIAGNOSIS — N8000 Endometriosis of the uterus, unspecified: Secondary | ICD-10-CM | POA: Diagnosis present

## 2015-10-15 DIAGNOSIS — Z7982 Long term (current) use of aspirin: Secondary | ICD-10-CM | POA: Insufficient documentation

## 2015-10-15 DIAGNOSIS — N920 Excessive and frequent menstruation with regular cycle: Secondary | ICD-10-CM | POA: Diagnosis present

## 2015-10-15 DIAGNOSIS — D252 Subserosal leiomyoma of uterus: Secondary | ICD-10-CM | POA: Diagnosis present

## 2015-10-15 HISTORY — PX: LAPAROSCOPIC VAGINAL HYSTERECTOMY WITH SALPINGECTOMY: SHX6680

## 2015-10-15 LAB — PREGNANCY, URINE: Preg Test, Ur: NEGATIVE

## 2015-10-15 SURGERY — HYSTERECTOMY, VAGINAL, LAPAROSCOPY-ASSISTED, WITH SALPINGECTOMY
Anesthesia: General | Laterality: Bilateral

## 2015-10-15 MED ORDER — HYDROMORPHONE 1 MG/ML IV SOLN
INTRAVENOUS | Status: DC
  Administered 2015-10-15: 2.1 mg via INTRAVENOUS
  Administered 2015-10-15: 13:00:00 via INTRAVENOUS
  Administered 2015-10-15: 3.3 mg via INTRAVENOUS
  Administered 2015-10-16: 0.9 mg via INTRAVENOUS
  Administered 2015-10-16: 0.6 mg via INTRAVENOUS
  Filled 2015-10-15: qty 25

## 2015-10-15 MED ORDER — DIPHENHYDRAMINE HCL 12.5 MG/5ML PO ELIX
12.5000 mg | ORAL_SOLUTION | Freq: Four times a day (QID) | ORAL | Status: DC | PRN
  Filled 2015-10-15: qty 5

## 2015-10-15 MED ORDER — KETOROLAC TROMETHAMINE 30 MG/ML IJ SOLN: 30.0000 mg | Freq: Four times a day (QID) | INTRAMUSCULAR | Status: DC

## 2015-10-15 MED ORDER — SODIUM CHLORIDE 0.9% FLUSH: 9.0000 mL | INTRAVENOUS | Status: DC | PRN

## 2015-10-15 MED ORDER — DEXAMETHASONE SODIUM PHOSPHATE 10 MG/ML IJ SOLN
INTRAMUSCULAR | Status: AC
  Filled 2015-10-15: qty 1

## 2015-10-15 MED ORDER — PROPOFOL 10 MG/ML IV BOLUS
INTRAVENOUS | Status: AC
  Filled 2015-10-15: qty 20

## 2015-10-15 MED ORDER — LACTATED RINGERS IV SOLN
INTRAVENOUS | Status: DC
  Administered 2015-10-15 – 2015-10-16 (×2): via INTRAVENOUS

## 2015-10-15 MED ORDER — LIDOCAINE HCL (CARDIAC) 20 MG/ML IV SOLN
INTRAVENOUS | Status: AC
  Filled 2015-10-15: qty 5

## 2015-10-15 MED ORDER — KETOROLAC TROMETHAMINE 30 MG/ML IJ SOLN
INTRAMUSCULAR | Status: AC
  Administered 2015-10-15: 30 mg
  Filled 2015-10-15: qty 1

## 2015-10-15 MED ORDER — LACTATED RINGERS IR SOLN
Status: DC | PRN
  Administered 2015-10-15: 3000 mL

## 2015-10-15 MED ORDER — HYDROMORPHONE HCL 1 MG/ML IJ SOLN
0.2500 mg | INTRAMUSCULAR | Status: DC | PRN
  Administered 2015-10-15: 0.5 mg via INTRAVENOUS

## 2015-10-15 MED ORDER — DEXAMETHASONE SODIUM PHOSPHATE 4 MG/ML IJ SOLN
INTRAMUSCULAR | Status: DC | PRN
  Administered 2015-10-15: 4 mg via INTRAVENOUS

## 2015-10-15 MED ORDER — FENTANYL CITRATE (PF) 100 MCG/2ML IJ SOLN
INTRAMUSCULAR | Status: DC | PRN
  Administered 2015-10-15: 50 ug via INTRAVENOUS
  Administered 2015-10-15: 100 ug via INTRAVENOUS
  Administered 2015-10-15 (×2): 50 ug via INTRAVENOUS

## 2015-10-15 MED ORDER — MEPERIDINE HCL 25 MG/ML IJ SOLN: 6.2500 mg | INTRAMUSCULAR | Status: DC | PRN

## 2015-10-15 MED ORDER — ONDANSETRON HCL 4 MG/2ML IJ SOLN: 4.0000 mg | Freq: Four times a day (QID) | INTRAMUSCULAR | Status: DC | PRN

## 2015-10-15 MED ORDER — NALOXONE HCL 0.4 MG/ML IJ SOLN: 0.4000 mg | INTRAMUSCULAR | Status: DC | PRN

## 2015-10-15 MED ORDER — SCOPOLAMINE 1 MG/3DAYS TD PT72
MEDICATED_PATCH | TRANSDERMAL | Status: AC
  Administered 2015-10-15: 1.5 mg via TRANSDERMAL
  Filled 2015-10-15: qty 1

## 2015-10-15 MED ORDER — GLYCOPYRROLATE 0.2 MG/ML IJ SOLN
INTRAMUSCULAR | Status: AC
  Filled 2015-10-15: qty 1

## 2015-10-15 MED ORDER — HYDROCODONE-ACETAMINOPHEN 5-325 MG PO TABS
1.0000 | ORAL_TABLET | ORAL | Status: DC | PRN
  Administered 2015-10-16: 2 via ORAL
  Filled 2015-10-15: qty 2

## 2015-10-15 MED ORDER — SIMETHICONE 80 MG PO CHEW
80.0000 mg | CHEWABLE_TABLET | Freq: Four times a day (QID) | ORAL | Status: DC | PRN
  Filled 2015-10-15: qty 1

## 2015-10-15 MED ORDER — ONDANSETRON HCL 4 MG/2ML IJ SOLN
INTRAMUSCULAR | Status: AC
  Filled 2015-10-15: qty 2

## 2015-10-15 MED ORDER — ROCURONIUM BROMIDE 100 MG/10ML IV SOLN
INTRAVENOUS | Status: AC
  Filled 2015-10-15: qty 1

## 2015-10-15 MED ORDER — ONDANSETRON HCL 4 MG/2ML IJ SOLN
INTRAMUSCULAR | Status: AC
  Administered 2015-10-15: 4 mg via INTRAVENOUS
  Filled 2015-10-15: qty 2

## 2015-10-15 MED ORDER — GLYCOPYRROLATE 0.2 MG/ML IJ SOLN
INTRAMUSCULAR | Status: DC | PRN
  Administered 2015-10-15: 0.1 mg via INTRAVENOUS
  Administered 2015-10-15: .7 mg via INTRAVENOUS
  Administered 2015-10-15: 0.2 mg via INTRAVENOUS
  Administered 2015-10-15: 0.1 mg via INTRAVENOUS

## 2015-10-15 MED ORDER — SCOPOLAMINE 1 MG/3DAYS TD PT72
1.0000 | MEDICATED_PATCH | Freq: Once | TRANSDERMAL | Status: DC
  Administered 2015-10-15: 1.5 mg via TRANSDERMAL

## 2015-10-15 MED ORDER — NICOTINE 7 MG/24HR TD PT24
7.0000 mg | MEDICATED_PATCH | Freq: Every day | TRANSDERMAL | Status: DC
  Administered 2015-10-15 – 2015-10-16 (×2): 7 mg via TRANSDERMAL
  Filled 2015-10-15 (×2): qty 1

## 2015-10-15 MED ORDER — BUPIVACAINE HCL (PF) 0.25 % IJ SOLN
INTRAMUSCULAR | Status: AC
  Filled 2015-10-15: qty 30

## 2015-10-15 MED ORDER — GLYCOPYRROLATE 0.2 MG/ML IJ SOLN
INTRAMUSCULAR | Status: AC
  Filled 2015-10-15: qty 3

## 2015-10-15 MED ORDER — SODIUM CHLORIDE 0.9 % IJ SOLN
INTRAMUSCULAR | Status: AC
  Filled 2015-10-15: qty 50

## 2015-10-15 MED ORDER — MIDAZOLAM HCL 5 MG/5ML IJ SOLN
INTRAMUSCULAR | Status: DC | PRN
  Administered 2015-10-15: 2 mg via INTRAVENOUS

## 2015-10-15 MED ORDER — LACTATED RINGERS IV SOLN
INTRAVENOUS | Status: DC
  Administered 2015-10-15 (×5): via INTRAVENOUS

## 2015-10-15 MED ORDER — ROCURONIUM BROMIDE 100 MG/10ML IV SOLN
INTRAVENOUS | Status: DC | PRN
  Administered 2015-10-15: 50 mg via INTRAVENOUS
  Administered 2015-10-15 (×4): 10 mg via INTRAVENOUS

## 2015-10-15 MED ORDER — PROPOFOL 10 MG/ML IV BOLUS
INTRAVENOUS | Status: DC | PRN
  Administered 2015-10-15: 200 mg via INTRAVENOUS

## 2015-10-15 MED ORDER — ONDANSETRON HCL 4 MG/2ML IJ SOLN
INTRAMUSCULAR | Status: DC | PRN
  Administered 2015-10-15: 4 mg via INTRAVENOUS

## 2015-10-15 MED ORDER — DIPHENHYDRAMINE HCL 50 MG/ML IJ SOLN: 12.5000 mg | Freq: Four times a day (QID) | INTRAMUSCULAR | Status: DC | PRN

## 2015-10-15 MED ORDER — IBUPROFEN 600 MG PO TABS
600.0000 mg | ORAL_TABLET | Freq: Four times a day (QID) | ORAL | Status: DC
  Administered 2015-10-15 – 2015-10-16 (×2): 600 mg via ORAL
  Filled 2015-10-15 (×2): qty 1

## 2015-10-15 MED ORDER — VASOPRESSIN 20 UNIT/ML IV SOLN
INTRAVENOUS | Status: AC
Start: 2015-10-15 — End: 2015-10-15
  Filled 2015-10-15: qty 1

## 2015-10-15 MED ORDER — NEOSTIGMINE METHYLSULFATE 5 MG/5ML IV SOSY
PREFILLED_SYRINGE | INTRAVENOUS | Status: DC | PRN
  Administered 2015-10-15: 3.5 mg via INTRAVENOUS
  Administered 2015-10-15: 1 mg via INTRAVENOUS

## 2015-10-15 MED ORDER — OXYCODONE HCL 5 MG PO TABS: 5.0000 mg | ORAL_TABLET | Freq: Once | ORAL | Status: DC | PRN

## 2015-10-15 MED ORDER — OXYCODONE HCL 5 MG/5ML PO SOLN: 5.0000 mg | Freq: Once | ORAL | Status: DC | PRN

## 2015-10-15 MED ORDER — FENTANYL CITRATE (PF) 250 MCG/5ML IJ SOLN
INTRAMUSCULAR | Status: AC
  Filled 2015-10-15: qty 5

## 2015-10-15 MED ORDER — LIDOCAINE HCL (CARDIAC) 20 MG/ML IV SOLN
INTRAVENOUS | Status: DC | PRN
  Administered 2015-10-15: 60 mg via INTRAVENOUS

## 2015-10-15 MED ORDER — MIDAZOLAM HCL 2 MG/2ML IJ SOLN
INTRAMUSCULAR | Status: AC
  Filled 2015-10-15: qty 2

## 2015-10-15 MED ORDER — ONDANSETRON HCL 4 MG/2ML IJ SOLN: 4.0000 mg | Freq: Once | INTRAMUSCULAR | Status: DC

## 2015-10-15 MED ORDER — HYDROMORPHONE HCL 1 MG/ML IJ SOLN
INTRAMUSCULAR | Status: AC
  Filled 2015-10-15: qty 1

## 2015-10-15 MED ORDER — SODIUM CHLORIDE 0.9 % IV SOLN
INTRAVENOUS | Status: DC | PRN
  Administered 2015-10-15: 51 mL via INTRAMUSCULAR

## 2015-10-15 MED ORDER — LIDOCAINE HCL 1 % IJ SOLN
INTRAMUSCULAR | Status: AC
  Filled 2015-10-15: qty 20

## 2015-10-15 MED ORDER — BUPIVACAINE HCL (PF) 0.25 % IJ SOLN
INTRAMUSCULAR | Status: DC | PRN
  Administered 2015-10-15: 10 mL

## 2015-10-15 SURGICAL SUPPLY — 55 items
CABLE HIGH FREQUENCY MONO STRZ (ELECTRODE) ×1 IMPLANT
CLOTH BEACON ORANGE TIMEOUT ST (SAFETY) ×2 IMPLANT
CONT PATH 16OZ SNAP LID 3702 (MISCELLANEOUS) ×2 IMPLANT
COVER BACK TABLE 60X90IN (DRAPES) ×2 IMPLANT
DECANTER SPIKE VIAL GLASS SM (MISCELLANEOUS) ×1 IMPLANT
DRAPE SHEET LG 3/4 BI-LAMINATE (DRAPES) ×4 IMPLANT
DRSG COVADERM PLUS 2X2 (GAUZE/BANDAGES/DRESSINGS) ×2 IMPLANT
DRSG OPSITE POSTOP 3X4 (GAUZE/BANDAGES/DRESSINGS) IMPLANT
DURAPREP 26ML APPLICATOR (WOUND CARE) ×2 IMPLANT
ELECT REM PT RETURN 9FT ADLT (ELECTROSURGICAL) ×2
ELECTRODE REM PT RTRN 9FT ADLT (ELECTROSURGICAL) ×1 IMPLANT
FILTER SMOKE EVAC LAPAROSHD (FILTER) ×2 IMPLANT
FORCEPS CUTTING 33CM 5MM (CUTTING FORCEPS) IMPLANT
FORCEPS CUTTING 45CM 5MM (CUTTING FORCEPS) IMPLANT
GAUZE PACKING 2X5 YD STRL (GAUZE/BANDAGES/DRESSINGS) IMPLANT
GLOVE BIOGEL PI IND STRL 6.5 (GLOVE) ×1 IMPLANT
GLOVE BIOGEL PI IND STRL 7.0 (GLOVE) ×1 IMPLANT
GLOVE BIOGEL PI INDICATOR 6.5 (GLOVE) ×1
GLOVE BIOGEL PI INDICATOR 7.0 (GLOVE) ×1
GLOVE SURG SS PI 6.5 STRL IVOR (GLOVE) ×6 IMPLANT
LEGGING LITHOTOMY PAIR STRL (DRAPES) ×2 IMPLANT
LIQUID BAND (GAUZE/BANDAGES/DRESSINGS) IMPLANT
NDL MAYO CATGUT SZ4 TPR NDL (NEEDLE) ×1 IMPLANT
NEEDLE MAYO CATGUT SZ4 (NEEDLE) ×2 IMPLANT
NS IRRIG 1000ML POUR BTL (IV SOLUTION) ×2 IMPLANT
PACK LAVH (CUSTOM PROCEDURE TRAY) ×2 IMPLANT
PACK ROBOTIC GOWN (GOWN DISPOSABLE) ×2 IMPLANT
PAD MAGNETIC INST (MISCELLANEOUS) ×2 IMPLANT
PAD TRENDELENBURG POSITION (MISCELLANEOUS) ×2 IMPLANT
SET IRRIG TUBING LAPAROSCOPIC (IRRIGATION / IRRIGATOR) ×1 IMPLANT
SHEARS HARMONIC ACE PLUS 36CM (ENDOMECHANICALS) ×1 IMPLANT
SLEEVE XCEL OPT CAN 5 100 (ENDOMECHANICALS) ×2 IMPLANT
SOLUTION ELECTROLUBE (MISCELLANEOUS) ×1 IMPLANT
STRIP CLOSURE SKIN 1/4X3 (GAUZE/BANDAGES/DRESSINGS) IMPLANT
SUT CHROMIC 2 0 TIES 18 (SUTURE) IMPLANT
SUT VIC AB 0 CT1 18XCR BRD8 (SUTURE) ×1 IMPLANT
SUT VIC AB 0 CT1 27 (SUTURE) ×4
SUT VIC AB 0 CT1 27XBRD ANBCTR (SUTURE) IMPLANT
SUT VIC AB 0 CT1 27XCR 8 STRN (SUTURE) IMPLANT
SUT VIC AB 0 CT1 8-18 (SUTURE) ×2
SUT VIC AB 3-0 PS2 18 (SUTURE) ×2
SUT VIC AB 3-0 PS2 18XBRD (SUTURE) ×1 IMPLANT
SUT VICRYL 0 ENDOLOOP (SUTURE) IMPLANT
SUT VICRYL 0 TIES 12 18 (SUTURE) ×2 IMPLANT
SUT VICRYL 0 UR6 27IN ABS (SUTURE) ×4 IMPLANT
SYR 20CC LL (SYRINGE) ×2 IMPLANT
SYR 50ML LL SCALE MARK (SYRINGE) ×2 IMPLANT
SYR TB 1ML LUER SLIP (SYRINGE) ×2 IMPLANT
TOWEL OR 17X24 6PK STRL BLUE (TOWEL DISPOSABLE) ×4 IMPLANT
TRAY FOLEY CATH SILVER 14FR (SET/KITS/TRAYS/PACK) ×2 IMPLANT
TROCAR BALL TOP DISP 5MM (ENDOMECHANICALS) ×2 IMPLANT
TROCAR OPTI TIP 5M 100M (ENDOMECHANICALS) ×1 IMPLANT
TROCAR XCEL DIL TIP R 11M (ENDOMECHANICALS) ×2 IMPLANT
WARMER LAPAROSCOPE (MISCELLANEOUS) ×2 IMPLANT
WATER STERILE IRR 1000ML POUR (IV SOLUTION) ×2 IMPLANT

## 2015-10-15 NOTE — Discharge Instructions (Signed)
Laparoscopically Assisted Vaginal Hysterectomy A laparoscopically assisted vaginal hysterectomy (LAVH) is a surgical procedure to remove the uterus and cervix, and sometimes the ovaries and fallopian tubes. During an LAVH, some of the surgical removal is done through the vagina, and the rest is done through a few small surgical cuts (incisions) in the abdomen.  This procedure is usually considered in women when a vaginal hysterectomy is not an option. Your health care provider will discuss the risks and benefits of the different surgical techniques at your appointment. Generally, recovery time is faster and there are fewer complications after laparoscopic procedures than after open incisional procedures. LET YOUR HEALTH CARE PROVIDER KNOW ABOUT:   Any allergies you have.  All medicines you are taking, including vitamins, herbs, eye drops, creams, and over-the-counter medicines.  Previous problems you or members of your family have had with the use of anesthetics.  Any blood disorders you have.  Previous surgeries you have had.  Medical conditions you have. RISKS AND COMPLICATIONS Generally, this is a safe procedure. However, as with any procedure, complications can occur. Possible complications include:  Allergies to medicines.  Difficulty breathing.  Bleeding.  Infection.  Damage to other structures near your uterus and cervix. BEFORE THE PROCEDURE  Ask your health care provider about changing or stopping your regular medicines.  Take certain medicines, such as a colon-emptying preparation, as directed.  Do not eat or drink anything for at least 8 hours before your surgery.  Stop smoking if you smoke. Stopping will improve your health after surgery.  Arrange for a ride home after surgery and for help at home during recovery. PROCEDURE   An IV tube will be put into one of your veins in order to give you fluids and medicines.  You will receive medicines to relax you and  medicines that make you sleep (general anesthetic).  You may have a flexible tube (catheter) put into your bladder to drain urine.  You may have a tube put through your nose or mouth that goes into your stomach (nasogastric tube). The nasogastric tube removes digestive fluids and prevents you from feeling nauseated and from vomiting.  Tight-fitting (compression) stockings will be placed on your legs to promote circulation.  Three to four small incisions will be made in your abdomen. An incision also will be made in your vagina. Probes and tools will be inserted into the small incisions. The uterus and cervix are removed (and possibly your ovaries and fallopian tubes) through your vagina as well as through the small incisions that were made in the abdomen.  Your vagina is then sewn back to normal. AFTER THE PROCEDURE  You may have a liquid diet temporarily. You will most likely return to, and tolerate, your usual diet the day after surgery.  You will be passing urine through a catheter. It will be removed the day after surgery.  Your temperature, breathing rate, heart rate, blood pressure, and oxygen level will be monitored regularly.  You will still wear compression stockings on your legs until you are able to move around.  You will use a special device or do breathing exercises to keep your lungs clear.  You will be encouraged to walk as soon as possible.   This information is not intended to replace advice given to you by your health care provider. Make sure you discuss any questions you have with your health care provider.   Document Released: 02/23/2011 Document Revised: 03/27/2014 Document Reviewed: 09/19/2012 Elsevier Interactive Patient Education 2016 Elsevier   [employer]  

## 2015-10-15 NOTE — Plan of Care (Signed)
Problem: Pain Managment: Goal: General experience of comfort will improve Outcome: Completed/Met Date Met: 10/15/15 Good pain control on PCA pump.  Problem: Physical Regulation: Goal: Ability to maintain clinical measurements within normal limits will improve Outcome: Completed/Met Date Met: 10/15/15 VSS stable at this time.  Problem: Skin Integrity: Goal: Risk for impaired skin integrity will decrease Outcome: Completed/Met Date Met: 10/15/15 Patient has an incision that is CDI.No skin breakdown noted on head to toe examination.  Problem: Fluid Volume: Goal: Ability to maintain a balanced intake and output will improve Outcome: Completed/Met Date Met: 10/15/15 Tolerating po fluids well with good urinary output.  Problem: Nutrition: Goal: Adequate nutrition will be maintained Outcome: Completed/Met Date Met: 10/15/15 Advanced to Regular diet patient tolerated well.  Problem: Bowel/Gastric: Goal: Gastrointestinal status for postoperative course will improve Outcome: Progressing Last BM on 10/14/15 denies flatus at this time but tolerated a regular diet well.

## 2015-10-15 NOTE — Anesthesia Preprocedure Evaluation (Addendum)
Anesthesia Evaluation  Patient identified by MRN, date of birth, ID band Patient awake    Reviewed: Allergy & Precautions, NPO status , Patient's Chart, lab work & pertinent test results  Airway Mallampati: I  TM Distance: >3 FB Neck ROM: Full    Dental  (+) Teeth Intact, Dental Advisory Given   Pulmonary Current Smoker,    breath sounds clear to auscultation       Cardiovascular  Rhythm:Regular Rate:Normal     Neuro/Psych    GI/Hepatic   Endo/Other  Morbid obesity  Renal/GU      Musculoskeletal   Abdominal   Peds  Hematology   Anesthesia Other Findings   Reproductive/Obstetrics                            Anesthesia Physical Anesthesia Plan  ASA: II  Anesthesia Plan: General   Post-op Pain Management:    Induction: Intravenous  Airway Management Planned: Oral ETT  Additional Equipment:   Intra-op Plan:   Post-operative Plan: Extubation in OR  Informed Consent: I have reviewed the patients History and Physical, chart, labs and discussed the procedure including the risks, benefits and alternatives for the proposed anesthesia with the patient or authorized representative who has indicated his/her understanding and acceptance.   Dental advisory given  Plan Discussed with: CRNA, Anesthesiologist and Surgeon  Anesthesia Plan Comments:         Anesthesia Quick Evaluation

## 2015-10-15 NOTE — Op Note (Signed)
OPERATIVE REPORT   INDICATIONS:abnormal uterine bleeding, fibroids and pelvic pain  PRE-OP DIAGNOSIS:Uterine Fibroids, Pelvic Pain, Endometriosis   POST-OP DIAGNOSIS;same   PROCEDURE:Procedure(s) (LRB): LAPAROSCOPIC ASSISTED VAGINAL HYSTERECTOMY WITH SALPINGECTOMY (Bilateral)   SURGEON:HAYGOOD,VANESSA P  ASSIST: Gildardo Cranker M.D.  DISPOSITION OF SPECIMEN:Delivered to Histology  EBL: [id number] cc  COMPLICATIONS: None   Findings: The uterus was minimally enlarged with a single subserosal myoma measuring about 2 cm. The right and left fallopian tubes and ovaries appeared within normal limits.  PATIENT TO:  PACU - hemodynamically stable.  PROCEDURE DETAILS: The patient was taken to the operating room after appropriate identification and placed on the operating table.  After the attainment of adequate general anesthesia the patient was placed in the modified lithotomy position. Time out was undertaken. The abdomen and perineum were prepped with ChloraPrep.  The vagina was prepped with multiple layers of Betadine. A Foley catheter was inserted into the bladder and connected to straight drainage.The abdomen and perineum were draped as a sterile field.   Subumbilical and suprapubic injections of quarter percent Marcaine for a total of 10 cc was undertaken. A subumbilical incision was made and carried down to the fascia. The fascia was elevated and incised. The peritoneum was entered bluntly. Holding sutures were placed on the right and left sides of the peritoneum and fascia and held. A Hassan cannula was placed into the peritoneal cavity under direct visualization and tied down with the aforementioned sutures. The laparoscope was placed through the trocar sleeve. Suprapubic incisions to the right and left of midline were made and 5 mm trochars placed through those incisions into the peritoneal cavity under direct visualization. The above-noted findings were made and documented. The right  round ligament was then cauterized and cut using the Harmonic Ace. The upper pedicle on the right side including the tube oh uterine junction and the utero-ovarian ligament were then cauterized and cut with the Harmonic Ace. Hemostasis was noted to be adequate. A similar procedure was carried out on the opposite side. The bladder flap was developed sharply. The fallopian tubes on the right and left side were then excised using the Harmonic Ace and removed through the umbilical port. At this point, the surgeons proceeded to vaginal hysterectomy.   A weighted speculum was placed in the posterior vagina and Lahey tenaculae were placed on the anterior and posterior surfaces of the cervix. The cervicovaginal mucosa was injected with a dilute solution of Pitressin. The cervix was circumscribed. The anterior vaginal mucosa wasbluntly dissected off the anterior cervix and the anterior peritoneum entered. The bladder blade was placed and the bladder elevated. The posterior peritoneum was entered sharply and tagged. Uterosacral ligaments on the right and left side were clamped cut and suture ligated, and the sutures held. The paracervical tissues were then clamped cut and suture ligated. The uterine arteries were clamped cut and suture ligated. The parametral tissues were clamped, cut, and suture ligated.The uterus was inverted and the upper most connections of the parametrial tissue clamped cut tied with free tie and suture-ligated. The uterus and cervix were removed from the operative field.  The McCall culdoplasty sutures were then placed incorporating the uterosacral ligaments on either side, and the intervening peritoneum.  These were held. The vaginal angles were created with the sutures from the  uterosacral ligaments that had been held and placed through the anterior and posterior surfaces of the vagina then tied down. The remainder of the vaginal cuff was closed with figure-of-eight sutures of.  All  sutures used  were 0 Vicryl..  The McCall culdoplasty sutures were then tied down.  The surgeons changed gowns and gloves and returned to the laparoscopy. A pneumoperitoneum was created. Once again. Copious irrigation was carried out and hemostasis noted to be adequate. The subumbilical incision was closed with the aforementioned holding sutures of 0 Vicryl converted to 2 figure-of-eight sutures closing the fascia. The skin was closed with a subcuticular suture of 3-0 Monocryl. Dermabond was placed and a sterile dressing placed. The suprapubic incisions were reapproximated with Dermabond.   The patient was awakened from general anesthesia and taken to the recovery room in satisfactory condition having tolerated the procedure well the sponge and instrument counts correct.  Susan Manges, MD 4:15 PM

## 2015-10-15 NOTE — Anesthesia Procedure Notes (Addendum)
Procedure Name: Intubation Date/Time: 10/15/2015 7:30 AM Performed by: Vernice Jefferson Pre-anesthesia Checklist: Patient identified, Emergency Drugs available, Suction available, Patient being monitored and Timeout performed Patient Re-evaluated:Patient Re-evaluated prior to inductionOxygen Delivery Method: Circle system utilized Preoxygenation: Pre-oxygenation with 100% oxygen Intubation Type: IV induction Ventilation: Mask ventilation without difficulty Laryngoscope Size: Mac and 3 Grade View: Grade II Tube type: Oral Tube size: 7.0 mm Number of attempts: 1 Airway Equipment and Method: Stylet Placement Confirmation: ETT inserted through vocal cords under direct vision,  positive ETCO2 and breath sounds checked- equal and bilateral Secured at: 21 cm Tube secured with: Tape Dental Injury: Teeth and Oropharynx as per pre-operative assessment

## 2015-10-15 NOTE — Anesthesia Postprocedure Evaluation (Signed)
Anesthesia Post Note  Patient: Susan King  Procedure(s) Performed: Procedure(s) (LRB): LAPAROSCOPIC ASSISTED VAGINAL HYSTERECTOMY WITH SALPINGECTOMY (Bilateral)  Patient location during evaluation: PACU Anesthesia Type: General Level of consciousness: awake and alert Pain management: pain level controlled Vital Signs Assessment: post-procedure vital signs reviewed and stable Respiratory status: spontaneous breathing, nonlabored ventilation, respiratory function stable and patient connected to nasal cannula oxygen Cardiovascular status: blood pressure returned to baseline and stable Postop Assessment: no signs of nausea or vomiting Anesthetic complications: no     Last Vitals:  Vitals:   10/15/15 1300 10/15/15 1315  BP: 115/77 127/66  Pulse: 94 95  Resp: (!) 21 (!) 22  Temp: 36.8 C     Last Pain:  Vitals:   10/15/15 1330  TempSrc:   PainSc: 4    Pain Goal: Patients Stated Pain Goal: 3 (10/15/15 1330)               Laykin Rainone A

## 2015-10-15 NOTE — Anesthesia Postprocedure Evaluation (Signed)
Anesthesia Post Note  Patient: Susan King  Procedure(s) Performed: Procedure(s) (LRB): LAPAROSCOPIC ASSISTED VAGINAL HYSTERECTOMY WITH SALPINGECTOMY (Bilateral)  Patient location during evaluation: [facility] Anesthesia Type: General Level of consciousness: awake Pain management: satisfactory to patient Vital Signs Assessment: post-procedure vital signs reviewed and stable Respiratory status: spontaneous breathing Cardiovascular status: stable Anesthetic complications: no     Last Vitals:  Vitals:   10/15/15 1649 10/15/15 1654  BP: 107/69   Pulse: 70   Resp: 15 14  Temp:      Last Pain:  Vitals:   10/15/15 1703  TempSrc:   PainSc: 0-No pain   Pain Goal: Patients Stated Pain Goal: 0 (10/15/15 1703)               Casimer Lanius

## 2015-10-15 NOTE — Transfer of Care (Signed)
Immediate Anesthesia Transfer of Care Note  Patient: Susan King  Procedure(s) Performed: Procedure(s) with comments: LAPAROSCOPIC ASSISTED VAGINAL HYSTERECTOMY WITH SALPINGECTOMY (Bilateral) - 3 hours  Patient Location: PACU  Anesthesia Type:General  Level of Consciousness: awake, alert  and oriented  Airway & Oxygen Therapy: Patient Spontanous Breathing and Patient connected to nasal cannula oxygen  Post-op Assessment: Report given to RN and Post -op Vital signs reviewed and stable  Post vital signs: Reviewed and stable  Last Vitals:  Vitals:   10/15/15 0623  BP: 124/84  Pulse: 86  Resp: 18  Temp: 37.1 C    Last Pain:  Vitals:   10/15/15 0623  TempSrc: Oral      Patients Stated Pain Goal: 2 ([id number])  Complications: No apparent anesthesia complications

## 2015-10-15 NOTE — Progress Notes (Signed)
Day of Surgery Procedure(s) (LRB): LAPAROSCOPIC ASSISTED VAGINAL HYSTERECTOMY WITH SALPINGECTOMY (Bilateral)  Subjective: Patient reports no  nausea,or vomiting.  Iincisional pain well controlled and tolerating PO.    Objective: I have reviewed patient's vital signs, intake and output and medications.  General: appears stated age and fatigued Resp: clear to auscultation bilaterally Cardio: regular rate and rhythm, S1, S2 normal, no murmur, click, rub or gallop GI: soft, non-tender; bowel sounds normal; no masses,  no organomegaly and incision: dry Extremities: extremities normal, atraumatic, no cyanosis or edema Vaginal Bleeding: minimal  Assessment: s/p Procedure(s) with comments: LAPAROSCOPIC ASSISTED VAGINAL HYSTERECTOMY WITH SALPINGECTOMY (Bilateral) - 3 hours: stable, progressing well and tolerating diet  Plan: Advance diet Encourage ambulation Advance to PO medication Discontinue IV fluids Discharge home on 10/16/15  LOS: 0 days    Berdell Hostetler P 10/15/2015, 10:23 PM

## 2015-10-15 NOTE — Addendum Note (Signed)
Addendum  created 10/15/15 2028 by Asher Muir, CRNA   Sign clinical note

## 2015-10-16 DIAGNOSIS — N920 Excessive and frequent menstruation with regular cycle: Secondary | ICD-10-CM | POA: Diagnosis not present

## 2015-10-16 LAB — CBC
HCT: 29.1 % — ABNORMAL LOW (ref 36.0–46.0)
Hemoglobin: 9.8 g/dL — ABNORMAL LOW (ref 12.0–15.0)
MCH: 27.9 pg (ref 26.0–34.0)
MCHC: 33.7 g/dL (ref 30.0–36.0)
MCV: 82.9 fL (ref 78.0–100.0)
PLATELETS: 165 10*3/uL (ref 150–400)
RBC: 3.51 MIL/uL — ABNORMAL LOW (ref 3.87–5.11)
RDW: 13.7 % (ref 11.5–15.5)
WBC: 10.9 10*3/uL — AB (ref 4.0–10.5)

## 2015-10-16 MED ORDER — IBUPROFEN 600 MG PO TABS: 600.0000 mg | ORAL_TABLET | Freq: Four times a day (QID) | ORAL | 2 refills | Status: DC | PRN

## 2015-10-16 MED ORDER — HYDROCODONE-ACETAMINOPHEN 5-300 MG PO TABS: 1.0000 | ORAL_TABLET | ORAL | 0 refills | Status: DC | PRN

## 2015-10-16 MED ORDER — ONDANSETRON 8 MG PO TBDP: 8.0000 mg | ORAL_TABLET | Freq: Three times a day (TID) | ORAL | 0 refills | Status: DC | PRN

## 2015-10-16 MED ORDER — DOCUSATE SODIUM 100 MG PO CAPS: 100.0000 mg | ORAL_CAPSULE | Freq: Two times a day (BID) | ORAL | 2 refills | Status: DC

## 2015-10-16 NOTE — Discharge Summary (Signed)
Physician Discharge Summary  Patient ID: Susan King MRN: [id number] DOB/AGE: [birth year] 45 y.o.  Admit date:         10/15/2015 Discharge date: 10/16/2015  Admission Diagnoses:  Uterine Fibroids, Pelvic Pain, Endometriosis, Obesity  Discharge Diagnoses:   Same  Anemia  Procedures this Admission:  10/15/2015  Procedure(s) (LRB): LAPAROSCOPIC ASSISTED VAGINAL HYSTERECTOMY WITH SALPINGECTOMY (Bilateral)  Discharged Condition: good   Admission Hx and PE: The patient has been followed at the [facility]. She has a history of  Uterine Fibroids, Pelvic Pain, Endometriosis. Please see her documented history and physical exam. The patient is now ready to proceed with definitive therapy.  Hospital course:  On the day of admission, the patient underwent the following Procedure(s):  LAPAROSCOPIC ASSISTED VAGINAL HYSTERECTOMY WITH SALPINGECTOMY.   Operative findings included a 2 cm fibroid on the uterus.  The patient tolerated her procedure well.  The estimated blood loss was 200 cc. Her postoperative course was uneventful. She quickly tolerated a regular diet. Her postoperative pain was controlled with oral medication. She remained afebrile. Today she was felt to be ready for discharge.  Labs:  Hemoglobin  Date Value Ref Range Status  10/16/2015 9.8 (L) 12.0 - 15.0 g/dL Final   HCT  Date Value Ref Range Status  10/16/2015 29.1 (L) 36.0 - 46.0 % Final    Consults: None  Final pathology report: Pending at the time of discharge  Disposition:  The patient will be discharged to home. She has been given a copy of the discharge instructions as prepared by the [facility] for patients who have undergone the Procedure(s):  LAPAROSCOPIC ASSISTED VAGINAL HYSTERECTOMY WITH SALPINGECTOMY.  The patient was told to refrain from driving for 1-2 weeks, heavy lifting for 4 weeks, and intercourse was 6 weeks.  She will  call for questions or concerns.    Medication List    TAKE these medications   acetaminophen 500 MG tablet Commonly known as:  TYLENOL Take 1,000 mg by mouth every 6 (six) hours as needed for pain.   aspirin 81 MG tablet Take 81 mg by mouth daily as needed for pain.   docusate sodium 100 MG capsule Commonly known as:  COLACE Take 1 capsule (100 mg total) by mouth 2 (two) times daily.   Hydrocodone-Acetaminophen 5-300 MG Tabs Commonly known as:  VICODIN Take 1 tablet by mouth every 4 (four) hours as needed.   ibuprofen 600 MG tablet Commonly known as:  ADVIL,MOTRIN Take 1 tablet (600 mg total) by mouth every 6 (six) hours as needed for mild pain (Take Ibuprofen 600 mg every six hours continuously for five days. Do not take medication on an empty stomach). What changed:  medication strength  how much to take  when to take this  reasons to take this   nicotine 14 mg/24hr patch Commonly known as:  NICODERM CQ Place 1 patch (14 mg total) onto the skin daily.   ondansetron 8 MG disintegrating tablet Commonly known as:  ZOFRAN ODT Take 1 tablet (8 mg total) by mouth every 8 (eight) hours as needed for nausea or vomiting.   valACYclovir 1000 MG tablet Commonly known as:  VALTREX Take 1 tablet (1,000 mg total) by mouth 2 (two) times daily. What changed:  when to take this  reasons to take this   Vitamin D (Ergocalciferol) 50000 units Caps capsule Commonly known as:  DRISDOL Take (1) capsule by mouth every week x6 months, then stop.  Follow-up Information    HAYGOOD,VANESSA P, MD Follow up in 6 week(s).   Specialty:  Obstetrics and Gynecology Contact information: [address] [place] [address] [phone]           Signed: Eli Hose 10/16/2015, 8:30 PM

## 2015-10-16 NOTE — Progress Notes (Signed)
Pt d/c'd home with written instructions and verbalizes and understanding. No concerns are noted at this time. Toya Smothers, RN

## 2015-10-21 ENCOUNTER — Encounter (HOSPITAL_COMMUNITY): Payer: Self-pay | Admitting: Obstetrics and Gynecology

## 2016-03-02 ENCOUNTER — Emergency Department (HOSPITAL_COMMUNITY): Payer: Managed Care, Other (non HMO)

## 2016-03-02 ENCOUNTER — Emergency Department (HOSPITAL_COMMUNITY)
Admission: EM | Admit: 2016-03-02 | Discharge: 2016-03-02 | Disposition: A | Discharge status: Home / Self Care | Payer: Managed Care, Other (non HMO) | Attending: Physician Assistant | Admitting: Physician Assistant

## 2016-03-02 ENCOUNTER — Encounter (HOSPITAL_COMMUNITY): Payer: Self-pay | Admitting: Radiology

## 2016-03-02 DIAGNOSIS — S0003XA Contusion of scalp, initial encounter: Secondary | ICD-10-CM | POA: Diagnosis not present

## 2016-03-02 DIAGNOSIS — Y990 Civilian activity done for income or pay: Secondary | ICD-10-CM | POA: Diagnosis not present

## 2016-03-02 DIAGNOSIS — Z79899 Other long term (current) drug therapy: Secondary | ICD-10-CM | POA: Diagnosis not present

## 2016-03-02 DIAGNOSIS — M25511 Pain in right shoulder: Secondary | ICD-10-CM

## 2016-03-02 DIAGNOSIS — Z8673 Personal history of transient ischemic attack (TIA), and cerebral infarction without residual deficits: Secondary | ICD-10-CM | POA: Insufficient documentation

## 2016-03-02 DIAGNOSIS — Y939 Activity, unspecified: Secondary | ICD-10-CM | POA: Diagnosis not present

## 2016-03-02 DIAGNOSIS — M545 Low back pain, unspecified: Secondary | ICD-10-CM

## 2016-03-02 DIAGNOSIS — W19XXXA Unspecified fall, initial encounter: Secondary | ICD-10-CM

## 2016-03-02 DIAGNOSIS — Y929 Unspecified place or not applicable: Secondary | ICD-10-CM | POA: Diagnosis not present

## 2016-03-02 DIAGNOSIS — F17210 Nicotine dependence, cigarettes, uncomplicated: Secondary | ICD-10-CM | POA: Diagnosis not present

## 2016-03-02 DIAGNOSIS — W11XXXA Fall on and from ladder, initial encounter: Secondary | ICD-10-CM | POA: Insufficient documentation

## 2016-03-02 DIAGNOSIS — M25551 Pain in right hip: Secondary | ICD-10-CM | POA: Diagnosis not present

## 2016-03-02 DIAGNOSIS — R0789 Other chest pain: Secondary | ICD-10-CM

## 2016-03-02 DIAGNOSIS — S0990XA Unspecified injury of head, initial encounter: Secondary | ICD-10-CM | POA: Diagnosis present

## 2016-03-02 LAB — CBC WITH DIFFERENTIAL/PLATELET
Basophils Absolute: 0 10*3/uL (ref 0.0–0.1)
Basophils Relative: 1 %
EOS ABS: 0.2 10*3/uL (ref 0.0–0.7)
EOS PCT: 3 %
HCT: 36.3 % (ref 36.0–46.0)
Hemoglobin: 12.1 g/dL (ref 12.0–15.0)
LYMPHS ABS: 2.7 10*3/uL (ref 0.7–4.0)
LYMPHS PCT: 36 %
MCH: 27.6 pg (ref 26.0–34.0)
MCHC: 33.3 g/dL (ref 30.0–36.0)
MCV: 82.7 fL (ref 78.0–100.0)
MONOS PCT: 9 %
Monocytes Absolute: 0.6 10*3/uL (ref 0.1–1.0)
Neutro Abs: 3.9 10*3/uL (ref 1.7–7.7)
Neutrophils Relative %: 53 %
PLATELETS: 209 10*3/uL (ref 150–400)
RBC: 4.39 MIL/uL (ref 3.87–5.11)
RDW: 13.7 % (ref 11.5–15.5)
WBC: 7.4 10*3/uL (ref 4.0–10.5)

## 2016-03-02 LAB — URINALYSIS, ROUTINE W REFLEX MICROSCOPIC
BILIRUBIN URINE: NEGATIVE
GLUCOSE, UA: NEGATIVE mg/dL
HGB URINE DIPSTICK: NEGATIVE
Ketones, ur: NEGATIVE mg/dL
Leukocytes, UA: NEGATIVE
Nitrite: NEGATIVE
Protein, ur: NEGATIVE mg/dL
SPECIFIC GRAVITY, URINE: 1.025 (ref 1.005–1.030)
pH: 6 (ref 5.0–8.0)

## 2016-03-02 LAB — RAPID URINE DRUG SCREEN, HOSP PERFORMED
AMPHETAMINES: NOT DETECTED
Barbiturates: NOT DETECTED
Benzodiazepines: NOT DETECTED
Cocaine: NOT DETECTED
OPIATES: POSITIVE — AB
TETRAHYDROCANNABINOL: NOT DETECTED

## 2016-03-02 MED ORDER — HYDROCODONE-ACETAMINOPHEN 5-325 MG PO TABS
1.0000 | ORAL_TABLET | Freq: Once | ORAL | Status: AC
  Administered 2016-03-02: 1 via ORAL
  Filled 2016-03-02: qty 1

## 2016-03-02 MED ORDER — CYCLOBENZAPRINE HCL 10 MG PO TABS: 10.0000 mg | ORAL_TABLET | Freq: Two times a day (BID) | ORAL | 0 refills | Status: DC | PRN

## 2016-03-02 MED ORDER — CYCLOBENZAPRINE HCL 10 MG PO TABS
5.0000 mg | ORAL_TABLET | Freq: Once | ORAL | Status: AC
  Administered 2016-03-02: 5 mg via ORAL
  Filled 2016-03-02: qty 1

## 2016-03-02 MED ORDER — IBUPROFEN 200 MG PO TABS
600.0000 mg | ORAL_TABLET | Freq: Once | ORAL | Status: AC
  Administered 2016-03-02: 600 mg via ORAL
  Filled 2016-03-02: qty 3

## 2016-03-02 MED ORDER — TRAMADOL HCL 50 MG PO TABS: 50.0000 mg | ORAL_TABLET | Freq: Four times a day (QID) | ORAL | 0 refills | Status: DC | PRN

## 2016-03-02 MED ORDER — IBUPROFEN 600 MG PO TABS: 600.0000 mg | ORAL_TABLET | Freq: Four times a day (QID) | ORAL | 0 refills | Status: DC | PRN

## 2016-03-02 NOTE — ED Notes (Signed)
Pt has dx hx of migraine HA and c/o HA to occipital region of head post-traumatic fall yesterday.

## 2016-03-02 NOTE — Discharge Instructions (Signed)
All of your xray has been normal today. You have a hematoma to your head. Please apply ice. Take ibuprofen for pain and swelling. I have given you small amount of pain medicine to use. Take the flexeril for muscle spasm. Please soak in a warm bath with epson salt. You need to return to the Ed if you develop vision changes, worsening headache, abdominal pain, bruising, nausea or vomiting or for any other reasons.

## 2016-03-02 NOTE — ED Notes (Signed)
Delay in blood draw pt is not in the room, will try to collect later

## 2016-03-02 NOTE — ED Provider Notes (Signed)
[facility] DEPT Provider Note   CSN: [id number] Arrival date & time: 03/02/16  1111     History   Chief Complaint Chief Complaint  Patient presents with  . Fall    HPI Susan King is a 45 y.o. female.  45 year old African-American female with no significant past medical history presents to the ED today after sustaining a fall yesterday at work. Patient states that she was on a ladder when she leaned over and the ladder started to tip and she fell off landing onto her right side. Her head hit a plastic box. She endorses loss of consciousness. She also endorses amnesia to the event. States that EMS was called yesterday and evaluated her on scene. She refused to come to the ED. She went home and took ibuprofen and Vicodin and went to sleep last night patient awoke this morning complaining of a headache to her right occiput that she describes as throbbing. The pain is constant and gradual in onset. Denies worse headache of her life. She also complains of right-sided pain including her right shoulder, rib cage, flank, low back, hip. Patient states the pain is worse with movement. She is using heating pad on her side last night with some relief. Patient states it feels like her muscles are spasming and soreness. She denies any fever, chills, vision changes, neck pain, lightheadedness, dizziness, photophobia, chest pain, shortness of breath abdominal pain, nausea, emesis, urinary symptoms, change in bowel habits, numbness/tingling. Patient is not on any blood thinners. Patient has been ambulatory since the event.      Past Medical History:  Diagnosis Date  . Headache    Migraines  . HSV-2 (herpes simplex virus 2) infection   . Stroke [facility]) 2008   slight, no residual    Patient Active Problem List   Diagnosis Date Noted  . Menorrhagia 10/15/2015  . Fibroids, subserous 10/15/2015  . Uterus, adenomyosis 10/14/2015  . Chronic pelvic pain in female 10/14/2015  . Herpes simplex type 2  infection 06/15/2015  . Unspecified contraceptive management 06/18/2013  . Obesity 06/17/2013  . Tobacco use disorder 06/17/2013  . Stroke [facility]) 03/20/2006    Past Surgical History:  Procedure Laterality Date  . APPENDECTOMY    . LAPAROSCOPIC VAGINAL HYSTERECTOMY WITH SALPINGECTOMY Bilateral 10/15/2015   Procedure: LAPAROSCOPIC ASSISTED VAGINAL HYSTERECTOMY WITH SALPINGECTOMY;  Surgeon: Eldred Manges, MD;  Location: [facility];  Service: Gynecology;  Laterality: Bilateral;  3 hours    OB History    No data available       Home Medications    Prior to Admission medications   Medication Sig Start Date End Date Taking? Authorizing Provider  valACYclovir (VALTREX) 1000 MG tablet Take 1,000 mg by mouth 2 (two) times daily as needed (for outbreaks).   Yes Historical Provider, MD    Family History Family History  Problem Relation Age of Onset  . Heart disease Mother   . Diabetes Mother   . Hypertension Mother   . Cancer Sister     esophogeal    Social History Social History  Substance Use Topics  . Smoking status: Current Every Day Smoker    Packs/day: 0.15    Years: 30.00    Types: Cigarettes  . Smokeless tobacco: Never Used  . Alcohol use Yes     Comment: occas.     Allergies   Patient has no known allergies.   Review of Systems Review of Systems  Constitutional: Negative for chills and fever.  HENT: Negative for ear  pain and sore throat.   Eyes: Negative for pain and visual disturbance.  Respiratory: Negative for cough and shortness of breath.   Cardiovascular: Negative for chest pain and palpitations.  Gastrointestinal: Negative for abdominal pain, blood in stool, nausea and vomiting.  Genitourinary: Negative for dysuria, frequency, hematuria and urgency.  Musculoskeletal: Positive for back pain. Negative for arthralgias and gait problem.  Skin: Negative for color change and rash.  Neurological: Positive for headaches. Negative for dizziness, syncope,  weakness, light-headedness and numbness.  All other systems reviewed and are negative.    Physical Exam Updated Vital Signs BP 109/61   Pulse 72   Temp 97.7 F (36.5 C) (Oral)   Resp 23   Ht 5' 2.5" (1.588 m)   Wt 83.9 kg   LMP 09/07/2015 (Approximate)   SpO2 100%   BMI 33.30 kg/m   Physical Exam  Constitutional: She is oriented to person, place, and time. She appears well-developed and well-nourished. No distress.  HENT:  Head: Normocephalic. Head is with contusion.    Right Ear: Tympanic membrane, external ear and ear canal normal.  Left Ear: Tympanic membrane, external ear and ear canal normal.  Nose: Nose normal.  Mouth/Throat: Uvula is midline, oropharynx is clear and moist and mucous membranes are normal.  Eyes: Conjunctivae and EOM are normal. Pupils are equal, round, and reactive to light. Right eye exhibits no discharge. Left eye exhibits no discharge. No scleral icterus.  Neck: Normal range of motion. Neck supple. No thyromegaly present.  No midline C-spine tenderness. Full range of motion. No deformities or step-offs noted.  Cardiovascular: Normal rate, regular rhythm, normal heart sounds and intact distal pulses.  Exam reveals no gallop and no friction rub.   No murmur heard. Pulmonary/Chest: Effort normal and breath sounds normal. No respiratory distress.  Mild tenderness to palpation of the right rib cage. No ecchymosis. No deformities, crepitus, step-offs noted. Breath sounds are clear and equal bilaterally. No decreased breath sounds.  Abdominal: Soft. Bowel sounds are normal. She exhibits no distension. There is no tenderness. There is no rebound and no guarding.  No ecchymosis, no distention, tenderness to the abdomen.  Musculoskeletal: Normal range of motion.       Right shoulder: She exhibits bony tenderness and pain. She exhibits normal range of motion, no swelling, no effusion, no crepitus, no deformity, normal pulse and normal strength.       Right hip:  She exhibits bony tenderness. She exhibits normal range of motion, normal strength, no swelling, no crepitus and no deformity.  Patient with no T-spine, L-spine midline tenderness. She does have right sided L-spine paraspinal muscular tenderness that wraps around to her right flank. No signs of ecchymosis, distention. Tenderness is more muscular related. Muscular is tense and spasming.  Lymphadenopathy:    She has no cervical adenopathy.  Neurological: She is alert and oriented to person, place, and time.  The patient is alert, attentive, and oriented x 3. Speech is clear. Cranial nerve II-VII grossly intact. Negative pronator drift. Sensation intact. Strength 5/5 in all extremities. Reflexes 2+ and symmetric at biceps, triceps, knees, and ankles. Rapid alternating movement and fine finger movements intact. Romberg is absent. Posture and gait normal.   Skin: Skin is warm and dry. Capillary refill takes less than 2 seconds. No pallor.  No ecchymosis or wounds noted.  Nursing note and vitals reviewed.    ED Treatments / Results  Labs (all labs ordered are listed, but only abnormal results are displayed) Labs Reviewed  CBC WITH DIFFERENTIAL/PLATELET  URINALYSIS, ROUTINE W REFLEX MICROSCOPIC    EKG  EKG Interpretation None       Radiology No results found.  Procedures Procedures (including critical care time)  Medications Ordered in ED Medications  HYDROcodone-acetaminophen (NORCO/VICODIN) 5-325 MG per tablet 1 tablet (1 tablet Oral Given 03/02/16 1248)     Initial Impression / Assessment and Plan / ED Course  I have reviewed the triage vital signs and the nursing notes.  Pertinent labs & imaging results that were available during my care of the patient were reviewed by me and considered in my medical decision making (see chart for details).  Clinical Course   The patient presents to the ED today after sustaining a fall yesterday around 3 PM on a stepladder at work. She  complains of right occipital pain, right-sided pain including her shoulder, hip, rib, low back. Patient without any focal neuro deficits. Head is atraumatic and normocephalic. There are no signs of intrathoracic or intra-abdominal injury including ecchymosis, distention. We will obtain head and neck CT given patient's amnesia and loss of consciousness. Plain films were ordered of patient's right shoulder, low back, right ribs, right hip. We will obtain urine and CBC to check for hemoglobin and blood in urine. Head and neck without any acute abnormalities. Patient with hematoma noted to right occiput without any laceration or fracture. All plain films were unremarkable. Hgb was normal. No blood in urine. Low suspicion for intra abdominal hemorrhage or kidney trauma. Patient feels improved after pain meds and flexeril. Informed patient of left lung base finding likely atelectasis can't r/o infiltrate. Pt without any sign or symptoms of pna. Will follow up with pcp if develop symptoms of pna. Pt is hemodynamically stable, in NAD, & able to ambulate in the ED. Pain has been managed & has no complaints prior to dc. Pt is comfortable with above plan and is stable for discharge at this time. All questions were answered prior to disposition. Strict return precautions for f/u to the ED were discussed.   Final Clinical Impressions(s) / ED Diagnoses   Final diagnoses:  Fall, initial encounter  Scalp hematoma, initial encounter  Acute pain of right shoulder  Right-sided chest wall pain  Acute right-sided low back pain without sciatica  Right hip pain    New Prescriptions Discharge Medication List as of 03/02/2016  4:27 PM    START taking these medications   Details  cyclobenzaprine (FLEXERIL) 10 MG tablet Take 1 tablet (10 mg total) by mouth 2 (two) times daily as needed for muscle spasms., Starting Thu 03/02/2016, Print    ibuprofen (ADVIL,MOTRIN) 600 MG tablet Take 1 tablet (600 mg total) by mouth every 6  (six) hours as needed., Starting Thu 03/02/2016, Print    traMADol (ULTRAM) 50 MG tablet Take 1 tablet (50 mg total) by mouth every 6 (six) hours as needed., Starting Thu 03/02/2016, Print         [name], PA-C 03/02/16 2222    [name], PA-C 03/02/16 [place], MD 03/05/16 1928

## 2016-05-02 ENCOUNTER — Encounter: Payer: Self-pay | Admitting: Family Medicine

## 2016-05-02 ENCOUNTER — Ambulatory Visit (INDEPENDENT_AMBULATORY_CARE_PROVIDER_SITE_OTHER): Payer: Managed Care, Other (non HMO) | Admitting: Family Medicine

## 2016-05-02 VITALS — BP 124/68 | HR 78 | Temp 98.2°F | Resp 16 | Ht 64.0 in | Wt 194.0 lb

## 2016-05-02 DIAGNOSIS — Z6833 Body mass index (BMI) 33.0-33.9, adult: Secondary | ICD-10-CM | POA: Diagnosis not present

## 2016-05-02 DIAGNOSIS — Z0000 Encounter for general adult medical examination without abnormal findings: Secondary | ICD-10-CM

## 2016-05-02 DIAGNOSIS — B009 Herpesviral infection, unspecified: Secondary | ICD-10-CM

## 2016-05-02 DIAGNOSIS — Z23 Encounter for immunization: Secondary | ICD-10-CM

## 2016-05-02 DIAGNOSIS — S0003XD Contusion of scalp, subsequent encounter: Secondary | ICD-10-CM | POA: Diagnosis not present

## 2016-05-02 DIAGNOSIS — E569 Vitamin deficiency, unspecified: Secondary | ICD-10-CM | POA: Diagnosis not present

## 2016-05-02 DIAGNOSIS — E6609 Other obesity due to excess calories: Secondary | ICD-10-CM

## 2016-05-02 DIAGNOSIS — Z8673 Personal history of transient ischemic attack (TIA), and cerebral infarction without residual deficits: Secondary | ICD-10-CM | POA: Diagnosis not present

## 2016-05-02 DIAGNOSIS — F17200 Nicotine dependence, unspecified, uncomplicated: Secondary | ICD-10-CM | POA: Diagnosis not present

## 2016-05-02 LAB — CBC WITH DIFFERENTIAL/PLATELET
BASOS PCT: 1 %
Basophils Absolute: 51 cells/uL (ref 0–200)
EOS ABS: 153 {cells}/uL (ref 15–500)
Eosinophils Relative: 3 %
HCT: 41.1 % (ref 35.0–45.0)
Hemoglobin: 13.4 g/dL (ref 12.0–15.0)
LYMPHS PCT: 36 %
Lymphs Abs: 1836 cells/uL (ref 850–3900)
MCH: 27.9 pg (ref 27.0–33.0)
MCHC: 32.6 g/dL (ref 32.0–36.0)
MCV: 85.4 fL (ref 80.0–100.0)
MONO ABS: 357 {cells}/uL (ref 200–950)
MONOS PCT: 7 %
MPV: 11.2 fL (ref 7.5–12.5)
NEUTROS ABS: 2703 {cells}/uL (ref 1500–7800)
Neutrophils Relative %: 53 %
PLATELETS: 226 10*3/uL (ref 140–400)
RBC: 4.81 MIL/uL (ref 3.80–5.10)
RDW: 14.4 % (ref 11.0–15.0)
WBC: 5.1 10*3/uL (ref 3.8–10.8)

## 2016-05-02 LAB — LIPID PANEL
CHOLESTEROL: 173 mg/dL (ref <200)
HDL: 48 mg/dL — ABNORMAL LOW (ref >50)
LDL Cholesterol: 105 mg/dL — ABNORMAL HIGH (ref <100)
Total CHOL/HDL Ratio: 3.6 Ratio (ref <5.0)
Triglycerides: 98 mg/dL (ref <150)
VLDL: 20 mg/dL (ref <30)

## 2016-05-02 LAB — COMPREHENSIVE METABOLIC PANEL
ALK PHOS: 95 U/L (ref 33–115)
ALT: 19 U/L (ref 6–29)
AST: 14 U/L (ref 10–35)
Albumin: 4.1 g/dL (ref 3.6–5.1)
BILIRUBIN TOTAL: 0.4 mg/dL (ref 0.2–1.2)
BUN: 14 mg/dL (ref 7–25)
CO2: 27 mmol/L (ref 20–31)
CREATININE: 0.88 mg/dL (ref 0.50–1.10)
Calcium: 8.9 mg/dL (ref 8.6–10.2)
Chloride: 104 mmol/L (ref 98–110)
GLUCOSE: 79 mg/dL (ref 70–99)
POTASSIUM: 4.5 mmol/L (ref 3.5–5.3)
Sodium: 140 mmol/L (ref 135–146)
TOTAL PROTEIN: 7.2 g/dL (ref 6.1–8.1)

## 2016-05-02 MED ORDER — BUPROPION HCL ER (SMOKING DET) 150 MG PO TB12: 150.0000 mg | ORAL_TABLET | Freq: Two times a day (BID) | ORAL | 2 refills | Status: DC

## 2016-05-02 MED ORDER — PHENTERMINE HCL 37.5 MG PO TABS: ORAL_TABLET | ORAL | 2 refills | Status: DC

## 2016-05-02 MED ORDER — VALACYCLOVIR HCL 500 MG PO TABS: 500.0000 mg | ORAL_TABLET | Freq: Every day | ORAL | 2 refills | Status: DC

## 2016-05-02 NOTE — Assessment & Plan Note (Signed)
Since she has a history as well as her obesity will check her cholesterol panel today

## 2016-05-02 NOTE — Assessment & Plan Note (Signed)
She is any relationship she desires suppressive therapy was started on Valtrex 500 mg once a day

## 2016-05-02 NOTE — Assessment & Plan Note (Signed)
She would benefit from weight loss. We'll try her on phentermine short-term discussed side effects of the medication. She'll follow up in 2 months for her weight. She or he has the handout I gave her previously about low-carb low sugar diet and calories

## 2016-05-02 NOTE — Progress Notes (Signed)
Subjective:    Patient ID: Susan King, female    DOB: 04-19-1970, 47 y.o.   MRN: [id number]  Patient presents for CPE (is fasting) and S/P Fall (had fall on 12/14 from ladder at work- continues to have tenderness to back of head)  Pt here for annual exam  She is status post laparoscopic hysterectomy with tube removal secondary to uterine fibroids chronic pelvic pain and endometriosis. This was performed by Dr. Raphael Gibney in July 2017  Tobacco she continues to [facility] like to try something for tobacco cessation   Has remote history of stroke last cholesterol panel was in March 2017 which was normal with the exception of low HDL. She also has vitamin D deficiency  She had a fall off a ladder at work 2 months ago. She was seen in emergency room on December 14 she did have a hematoma in her right occipital region. She has CT scan done on her neck as well as her head she also had x-rays of her ribs lumbar spine and bilateral hips and shoulder-right CT scan shows significant right scalp hematoma without any fracture, there was also loss of cervical lower doses suggesting spasm but no evidence of any fracture of cervical spine Was no rib fractures , lumbar spine/ hips/shoulder xrays were normal Continues to have pain and swelling in her right occiput states it has not gone down and still very tender and giving her headaches.  Mammogram UTD  Obesity- would like something to her appetite and help her lose weight curve her appetite and help her lose weight she's been trying to follow a diet she is down 7 pounds her last visit in March  History of HSV she would like to go on suppressive therapy she is in a new relationship  Review Of Systems:  GEN- denies fatigue, fever, weight loss,weakness, recent illness HEENT- denies eye drainage, change in vision, nasal discharge, CVS- denies chest pain, palpitations RESP- denies SOB, cough, wheeze ABD- denies N/V, change in stools, abd pain GU- denies  dysuria, hematuria, dribbling, incontinence MSK- denies joint pain, muscle aches, injury Neuro- + headache, dizziness, syncope, seizure activity       Objective:    BP 124/68   Pulse 78   Temp 98.2 F (36.8 C) (Oral)   Resp 16   Ht 5\' 4"  (1.626 m)   Wt 194 lb (88 kg)   LMP 09/07/2015 (Approximate)   BMI 33.30 kg/m  GEN- NAD, alert and oriented x3, weight down 7lbs since last visit  HEENT- PERRL, EOMI, non injected sclera, pink conjunctiva, MMM, oropharynx clear, TM impacted bilat, nares clear Neck- Supple, no thyromegaly, good ROM CVS- RRR, no murmur RESP-CTAB ABD-NABS,soft,NT,ND Headh- right side- small hematoma mild fluctance, TTP, loss of hair at center  NEURO- CNII-ii in tact , no deficits  EXT- No edema Pulses- Radial, DP- 2+        Assessment & Plan:      Problem List Items Addressed This Visit    Tobacco use disorder    Follow-up will be trended discussed the side effects of this. She has quit cold [place] in the past.      Relevant Orders   Lipid panel   Obesity    She would benefit from weight loss. We'll try her on phentermine short-term discussed side effects of the medication. She'll follow up in 2 months for her weight. She or he has the handout I gave her previously about low-carb low sugar diet and calories  Relevant Medications   phentermine (ADIPEX-P) 37.5 MG tablet   History of CVA in adulthood    Since she has a history as well as her obesity will check her cholesterol panel today      Herpes simplex type 2 infection    She is any relationship she desires suppressive therapy was started on Valtrex 500 mg once a day      Relevant Medications   valACYclovir (VALTREX) 500 MG tablet    Other Visit Diagnoses    Routine general medical examination at a health care facility    -  Primary   Physical done she will schedule her mammogram in May given flu shot and her pneumonia vaccine. Fasting labs   Relevant Orders   CBC with  Differential/Platelet   Comprehensive metabolic panel   Lipid panel   Vitamin deficiency       Relevant Orders   Vitamin D, 25-hydroxy   Hematoma of scalp, subsequent encounter       recheck CT of head, expect hematoma to have resolved by this point    Relevant Orders   CT Head Wo Contrast   Need for prophylactic vaccination and inoculation against influenza       Relevant Orders   Flu Vaccine QUAD 36+ mos PF IM (Fluarix & Fluzone Quad PF) (Completed)   Need for vaccination against Streptococcus pneumoniae          Note: This dictation was prepared with Dragon dictation along with smaller phrase technology. Any transcriptional errors that result from this process are unintentional.

## 2016-05-02 NOTE — Patient Instructions (Addendum)
Pneumonia vaccine Flu vaccine We will call with labs CT of head to be done  wellbutrin for smoking for 3 months Phentermine for weight loss- start with 1/2 tablet for 2 weeks then 1 tablet daily  Schedule mammogram in May F/U 2 months weight

## 2016-05-02 NOTE — Assessment & Plan Note (Signed)
Follow-up will be trended discussed the side effects of this. She has quit cold [place] in the past.

## 2016-05-03 ENCOUNTER — Encounter: Payer: Self-pay | Admitting: Family Medicine

## 2016-05-03 LAB — VITAMIN D 25 HYDROXY (VIT D DEFICIENCY, FRACTURES): VIT D 25 HYDROXY: 14 ng/mL — AB (ref 30–100)

## 2016-05-05 ENCOUNTER — Other Ambulatory Visit: Payer: Self-pay | Admitting: *Deleted

## 2016-05-05 MED ORDER — VITAMIN D (ERGOCALCIFEROL) 1.25 MG (50000 UNIT) PO CAPS: 50000.0000 [IU] | ORAL_CAPSULE | ORAL | 1 refills | Status: DC

## 2016-05-08 ENCOUNTER — Other Ambulatory Visit: Payer: Managed Care, Other (non HMO)

## 2016-05-16 ENCOUNTER — Encounter (HOSPITAL_COMMUNITY): Payer: Self-pay

## 2016-05-16 ENCOUNTER — Ambulatory Visit (HOSPITAL_COMMUNITY)
Admission: RE | Admit: 2016-05-16 | Discharge: 2016-05-16 | Disposition: A | Discharge status: Home / Self Care | Payer: Managed Care, Other (non HMO) | Source: Ambulatory Visit | Attending: Family Medicine | Admitting: Family Medicine

## 2016-05-16 DIAGNOSIS — S0003XD Contusion of scalp, subsequent encounter: Secondary | ICD-10-CM

## 2016-05-18 ENCOUNTER — Other Ambulatory Visit: Payer: Self-pay | Admitting: Family Medicine

## 2016-05-18 DIAGNOSIS — Z1231 Encounter for screening mammogram for malignant neoplasm of breast: Secondary | ICD-10-CM

## 2016-05-23 ENCOUNTER — Other Ambulatory Visit: Payer: Self-pay | Admitting: Family Medicine

## 2016-06-13 ENCOUNTER — Telehealth: Payer: Self-pay | Admitting: Family Medicine

## 2016-06-13 MED ORDER — TOPIRAMATE 25 MG PO TABS: ORAL_TABLET | ORAL | 0 refills | Status: DC

## 2016-06-13 NOTE — Telephone Encounter (Signed)
Prescription sent to pharmacy.   Call placed to patient. No answer. No VM.

## 2016-06-13 NOTE — Telephone Encounter (Signed)
Patient requesting a medication for post concussion for head from December. She still hasn't CT scan yet workers comp hasn't approved she is now waiting on the CT scan. Kristopher Oppenheim [address] [phone]

## 2016-06-13 NOTE — Telephone Encounter (Signed)
Call placed to patient to inquire.   Was advised that she has not been able to get CT D/T workman's comp. States that sh was advised to go to UC to have HA addressed.   States that she is having CT scheduled through [employer], but was advised that PCP will need to prescribe something for chronic HA. States that she is now using OTC IBU.

## 2016-06-13 NOTE — Telephone Encounter (Signed)
She needs to have MRI of brain this far out.If they are declining then we can order, but It will NOT be under workers Comp.   MRI brain- Dx- post concussive headache   Med- start topamax 25mg  at bedtime, increase to 50mg  at bedtime after 2 weeks

## 2016-06-14 NOTE — Telephone Encounter (Signed)
Call placed to patient. No answer. No VM.  

## 2016-06-15 NOTE — Telephone Encounter (Signed)
Call placed to patient and patient made aware.   States that she has picked up the [place] and started it.   Reports that she has not had CT at this time, but declines MRI since it is not covered.   Requested call from MD to discuss.

## 2016-06-20 ENCOUNTER — Encounter: Payer: Managed Care, Other (non HMO) | Admitting: Family Medicine

## 2016-06-30 ENCOUNTER — Ambulatory Visit: Payer: Managed Care, Other (non HMO) | Admitting: Family Medicine

## 2016-07-04 ENCOUNTER — Ambulatory Visit: Payer: Managed Care, Other (non HMO) | Admitting: Family Medicine

## 2016-07-11 ENCOUNTER — Ambulatory Visit: Payer: Managed Care, Other (non HMO) | Admitting: Family Medicine

## 2016-07-18 ENCOUNTER — Encounter: Payer: Self-pay | Admitting: Family Medicine

## 2016-07-18 ENCOUNTER — Ambulatory Visit (INDEPENDENT_AMBULATORY_CARE_PROVIDER_SITE_OTHER): Payer: Managed Care, Other (non HMO) | Admitting: Family Medicine

## 2016-07-18 VITALS — BP 108/68 | HR 72 | Temp 98.4°F | Resp 14 | Ht 64.0 in | Wt 192.0 lb

## 2016-07-18 DIAGNOSIS — E6609 Other obesity due to excess calories: Secondary | ICD-10-CM

## 2016-07-18 DIAGNOSIS — F321 Major depressive disorder, single episode, moderate: Secondary | ICD-10-CM

## 2016-07-18 DIAGNOSIS — M7711 Lateral epicondylitis, right elbow: Secondary | ICD-10-CM

## 2016-07-18 DIAGNOSIS — G43709 Chronic migraine without aura, not intractable, without status migrainosus: Secondary | ICD-10-CM

## 2016-07-18 DIAGNOSIS — G43909 Migraine, unspecified, not intractable, without status migrainosus: Secondary | ICD-10-CM | POA: Insufficient documentation

## 2016-07-18 DIAGNOSIS — Z6833 Body mass index (BMI) 33.0-33.9, adult: Secondary | ICD-10-CM

## 2016-07-18 DIAGNOSIS — F339 Major depressive disorder, recurrent, unspecified: Secondary | ICD-10-CM | POA: Insufficient documentation

## 2016-07-18 MED ORDER — DICLOFENAC SODIUM 75 MG PO TBEC: 75.0000 mg | DELAYED_RELEASE_TABLET | Freq: Two times a day (BID) | ORAL | 0 refills | Status: DC

## 2016-07-18 MED ORDER — PHENTERMINE HCL 37.5 MG PO TABS: ORAL_TABLET | ORAL | 2 refills | Status: DC

## 2016-07-18 MED ORDER — ESCITALOPRAM OXALATE 5 MG PO TABS: 5.0000 mg | ORAL_TABLET | Freq: Every day | ORAL | 6 refills | Status: DC

## 2016-07-18 NOTE — Progress Notes (Signed)
Subjective:    Patient ID: Susan King, female    DOB: 08-18-1970, 46 y.o.   MRN: [id number]  Patient presents for 2 month F/U (is not fasting) and R Elbow Pain (x3 weeks- denies injury to elbow)  Pt here for F/U obesity, started on phentermine back in Feb, weight was 194lbs,fasting labs at that time showed improved cholesterol and normal glucose. Today weight down 2lbs.   Tobacco use- started on wellbutrin for smoking in Feb, She states he gave her where drains therefore she stopped she did not notify the office of this.   Right elbow pain- for past few weeks , no injury but does a lot of lifting at work, no swelling  On further discussed and she became very tearful stating that she feels like everything is out of control and that she is sick. She continues to have migraines which have worsened since she had a fall off a ladder striking her head. She had a CT scan done which I did review it is negative. They're trying to get her set with a neurologist with Worker's Comp. seems to be taking quite some time to do so. She is taking topamax which helps some.  She finds herself tearful at times and depressed she is not eating well. She is concerned her hair is thinning and breaking off. She also had a couple episodes where she could not concentrate at work, mixed up an order    Review Of Systems:  GEN- denies fatigue, fever, weight loss,weakness, recent illness HEENT- denies eye drainage, change in vision, nasal discharge, CVS- denies chest pain, palpitations RESP- denies SOB, cough, wheeze ABD- denies N/V, change in stools, abd pain GU- denies dysuria, hematuria, dribbling, incontinence MSK- denies joint pain, muscle aches, injury Neuro- + headache, dizziness, syncope, seizure activity       Objective:    BP 108/68   Pulse 72   Temp 98.4 F (36.9 C) (Oral)   Resp 14   Ht 5\' 4"  (1.626 m)   Wt 192 lb (87.1 kg)   LMP 09/07/2015 (Approximate)   BMI 32.96 kg/m  GEN- NAD, alert and  oriented x3 HEENT- PERRL, EOMI, non injected sclera, pink conjunctiva, MMM, oropharynx clear Skin-hair breakage noted around hairline no balding spots Neck- Supple, no thyromegaly CVS- RRR, no murmur RESP-CTAB Psych-depressed affect tearful not anxious appearing no suicidal ideations MSK- normal appearance to right elbow tender to Palpation at left epicondyles pain with rotation of the wrist into forearm. No effusion noted. Full range of motion of shoulder and wrist Pulse- Radial 2+       Assessment & Plan:      Problem List Items Addressed This Visit    Obesity   Relevant Medications   phentermine (ADIPEX-P) 37.5 MG tablet   Migraines - Primary    Concern for post concussive migraines, she needs evaluation by neurology But this is a workers comp case and per pt " it has been a very slow process" Advised I am willing to send her, but will not report to workers comp. She is going to follow up with them      Relevant Medications   escitalopram (LEXAPRO) 5 MG tablet   diclofenac (VOLTAREN) 75 MG EC tablet   Depression, major, single episode, moderate (HCC)    The depressed mood is at the Center along a lot of her issues. She is not eating healthy she is not exercising and she is having increased headaches she is increasing her caffeine. She  is trying to work in the setting of her Worker's Comp situation was makes things even more difficult for her. She is willing to try medication to help with her stressors right now. We'll start her on Lexapro 5 mg at bedtime.   She wants to continue with phentermine and try working on her diet. She does not think that there is been any increase in headaches after starting the phentermine.      Relevant Medications   escitalopram (LEXAPRO) 5 MG tablet    Other Visit Diagnoses    Lateral epicondylitis of right elbow       Diclofeanc BID, ICE, compression sleeve, ortho for injection if not improved   Relevant Medications   diclofenac  (VOLTAREN) 75 MG EC tablet      Note: This dictation was prepared with Dragon dictation along with smaller phrase technology. Any transcriptional errors that result from this process are unintentional.

## 2016-07-18 NOTE — Patient Instructions (Addendum)
F/U 2 months  For elbow- Take Diclofenac ICE Buy Elbow Compression sleeve- [employer] Lexapro at bedtime Call me about neurology

## 2016-07-18 NOTE — Assessment & Plan Note (Signed)
Concern for post concussive migraines, she needs evaluation by neurology But this is a workers comp case and per pt " it has been a very slow process" Advised I am willing to send her, but will not report to workers comp. She is going to follow up with them

## 2016-07-18 NOTE — Assessment & Plan Note (Signed)
The depressed mood is at the Center along a lot of her issues. She is not eating healthy she is not exercising and she is having increased headaches she is increasing her caffeine. She is trying to work in the setting of her Worker's Comp situation was makes things even more difficult for her. She is willing to try medication to help with her stressors right now. We'll start her on Lexapro 5 mg at bedtime.   She wants to continue with phentermine and try working on her diet. She does not think that there is been any increase in headaches after starting the phentermine.

## 2016-08-15 ENCOUNTER — Ambulatory Visit
Admission: RE | Admit: 2016-08-15 | Discharge: 2016-08-15 | Disposition: A | Discharge status: Home / Self Care | Payer: Managed Care, Other (non HMO) | Source: Ambulatory Visit | Attending: Family Medicine | Admitting: Family Medicine

## 2016-08-15 DIAGNOSIS — Z1231 Encounter for screening mammogram for malignant neoplasm of breast: Secondary | ICD-10-CM

## 2016-08-24 ENCOUNTER — Encounter (HOSPITAL_COMMUNITY): Payer: Self-pay | Admitting: Emergency Medicine

## 2016-08-24 ENCOUNTER — Emergency Department (HOSPITAL_COMMUNITY): Payer: Managed Care, Other (non HMO)

## 2016-08-24 ENCOUNTER — Emergency Department (HOSPITAL_COMMUNITY)
Admission: EM | Admit: 2016-08-24 | Discharge: 2016-08-24 | Disposition: A | Discharge status: Home / Self Care | Payer: Managed Care, Other (non HMO) | Attending: Emergency Medicine | Admitting: Emergency Medicine

## 2016-08-24 DIAGNOSIS — Z8673 Personal history of transient ischemic attack (TIA), and cerebral infarction without residual deficits: Secondary | ICD-10-CM | POA: Diagnosis not present

## 2016-08-24 DIAGNOSIS — F17210 Nicotine dependence, cigarettes, uncomplicated: Secondary | ICD-10-CM | POA: Diagnosis not present

## 2016-08-24 DIAGNOSIS — R079 Chest pain, unspecified: Secondary | ICD-10-CM | POA: Diagnosis not present

## 2016-08-24 DIAGNOSIS — Z79899 Other long term (current) drug therapy: Secondary | ICD-10-CM | POA: Insufficient documentation

## 2016-08-24 DIAGNOSIS — R531 Weakness: Secondary | ICD-10-CM | POA: Diagnosis not present

## 2016-08-24 LAB — POCT I-STAT TROPONIN I
TROPONIN I, POC: 0 ng/mL (ref 0.00–0.08)
Troponin i, poc: 0 ng/mL (ref 0.00–0.08)

## 2016-08-24 LAB — BASIC METABOLIC PANEL
Anion gap: 8 (ref 5–15)
BUN: 12 mg/dL (ref 6–20)
CALCIUM: 9.2 mg/dL (ref 8.9–10.3)
CO2: 28 mmol/L (ref 22–32)
CREATININE: 0.73 mg/dL (ref 0.44–1.00)
Chloride: 102 mmol/L (ref 101–111)
GFR calc Af Amer: >60 mL/min (ref >60)
GLUCOSE: 94 mg/dL (ref 65–99)
Potassium: 3.9 mmol/L (ref 3.5–5.1)
Sodium: 138 mmol/L (ref 135–145)

## 2016-08-24 LAB — CBC
HCT: 39.6 % (ref 36.0–46.0)
Hemoglobin: 13.4 g/dL (ref 12.0–15.0)
MCH: 28.6 pg (ref 26.0–34.0)
MCHC: 33.8 g/dL (ref 30.0–36.0)
MCV: 84.4 fL (ref 78.0–100.0)
Platelets: 219 10*3/uL (ref 150–400)
RBC: 4.69 MIL/uL (ref 3.87–5.11)
RDW: 13.2 % (ref 11.5–15.5)
WBC: 5.7 10*3/uL (ref 4.0–10.5)

## 2016-08-24 LAB — D-DIMER, QUANTITATIVE: D-Dimer, Quant: 0.27 ug/mL-FEU (ref 0.00–0.50)

## 2016-08-24 MED ORDER — MORPHINE SULFATE (PF) 2 MG/ML IV SOLN
4.0000 mg | Freq: Once | INTRAVENOUS | Status: AC
  Administered 2016-08-24: 4 mg via INTRAVENOUS
  Filled 2016-08-24: qty 2

## 2016-08-24 MED ORDER — ASPIRIN EC 325 MG PO TBEC
325.0000 mg | DELAYED_RELEASE_TABLET | Freq: Once | ORAL | Status: AC
  Administered 2016-08-24: 325 mg via ORAL
  Filled 2016-08-24: qty 1

## 2016-08-24 MED ORDER — KETOROLAC TROMETHAMINE 30 MG/ML IJ SOLN
30.0000 mg | Freq: Once | INTRAMUSCULAR | Status: AC
  Administered 2016-08-24: 30 mg via INTRAVENOUS
  Filled 2016-08-24: qty 1

## 2016-08-24 NOTE — ED Provider Notes (Signed)
Plains of left parasternal chest pain rating to back with "numbness in my left arm" onset this morning. Pain is intermittent. Last approximately 3 minutes at a time. No associated nausea shortness of breath or sweatiness. Pain is not made worse with exertion. Has a pleuritic component. No treatment prior to coming here. On exam alert and in no distress lungs clear auscultation heart regular rate and rhythm no murmurs or rubs chest is tender at left parasternal area reproducing pain exactly extremities without edema skin warm and dry and chest x-ray viewed by me. I counseled patient for 5 minutes on smoking cessation. Cardiac risk factors include smoking and obesity a low pretest clinical probability for pulmonary embolism. Negative d-dimer. Low clinical suspicion for aortic dissection   Orlie Dakin, MD 08/24/16 [phone]

## 2016-08-24 NOTE — ED Triage Notes (Signed)
Patient here from work with complaints of sudden onset of left sided chest pain. Nausea/weakness. Also reports left sided arm numbness and tingling. Smoker. No cardiac history.

## 2016-08-24 NOTE — Discharge Instructions (Signed)
Please follow-up with your primary care provider as well as the cardiologist as outlined below for further evaluation and treatment of your chest pain. Please return to emergency department if you develop any new or worsening symptoms. You can take ibuprofen and/or Tylenol as prescribed over-the-counter, as needed for your pain.

## 2016-08-24 NOTE — ED Provider Notes (Signed)
[facility] DEPT Provider Note   CSN: [id number] Arrival date & time: 08/24/16  1019     History   Chief Complaint Chief Complaint  Patient presents with  . Chest Pain  . Numbness  . Weakness    HPI Susan King is a 46 y.o. female with history of "small stroke" who presents with acute onset chest pain that began around 6 AM this morning. She described it as chest tightness with intermittent sharp pains in the central to left-sided chest. She has had radiation of the pain and numbness/tingling associated to the left arm. She is also had some intermittent sharp pains to left shoulder blade. Her pain is pleuritic. Patient felt warm when she was having chest pain at its worst, but no diaphoresis. She denies any nausea or vomiting. She does not take any medications for her pain prior to arrival. She denies fevers, shortness of breath, abdominal pain, nausea, vomiting, urinary symptoms. Patient denies any recent heavy lifting. Patient is a smoker. She denies any history of hypertension, high cholesterol, diabetes. Patient does have a family history of heart disorders including a sister who died of viral cardiomyopthy at age 48 and father who died of MI in his 16s.   Chest Pain   Associated symptoms include numbness and weakness. Pertinent negatives include no abdominal pain, no back pain, no fever, no headaches, no nausea, no shortness of breath and no vomiting.  Weakness  Associated symptoms include chest pain. Pertinent negatives include no shortness of breath, no vomiting and no headaches.    Past Medical History:  Diagnosis Date  . Headache    Migraines  . HSV-2 (herpes simplex virus 2) infection   . Stroke [facility]) 2008   slight, no residual    Patient Active Problem List   Diagnosis Date Noted  . Migraines 07/18/2016  . Depression, major, single episode, moderate ([place]) 07/18/2016  . Fibroids, subserous 10/15/2015  . Uterus, adenomyosis 10/14/2015  . Herpes simplex type 2  infection 06/15/2015  . Obesity 06/17/2013  . Tobacco use disorder 06/17/2013  . History of CVA in adulthood 03/20/2006    Past Surgical History:  Procedure Laterality Date  . APPENDECTOMY    . LAPAROSCOPIC VAGINAL HYSTERECTOMY WITH SALPINGECTOMY Bilateral 10/15/2015   Procedure: LAPAROSCOPIC ASSISTED VAGINAL HYSTERECTOMY WITH SALPINGECTOMY;  Surgeon: Eldred Manges, MD;  Location: [facility];  Service: Gynecology;  Laterality: Bilateral;  3 hours    OB History    No data available       Home Medications    Prior to Admission medications   Medication Sig Start Date End Date Taking? Authorizing Provider  Biotin w/ Vitamins C & E (HAIR SKIN & NAILS GUMMIES PO) Take 1 capsule by mouth daily.    Yes [provider]  clotrimazole-betamethasone (LOTRISONE) cream APPLY TOPICALLY TWICE A DAY AS DIRECTED 05/24/16  Yes [place], Modena Nunnery, MD  diclofenac (VOLTAREN) 75 MG EC tablet Take 1 tablet (75 mg total) by mouth 2 (two) times daily. 07/18/16  Yes [place], Modena Nunnery, MD  escitalopram (LEXAPRO) 5 MG tablet Take 1 tablet (5 mg total) by mouth at bedtime. 07/18/16  Yes [place], Modena Nunnery, MD  ibuprofen (ADVIL,MOTRIN) 600 MG tablet Take 1 tablet (600 mg total) by mouth every 6 (six) hours as needed. 03/02/16  Yes [name], PA-C  phentermine (ADIPEX-P) 37.5 MG tablet Take 1/2 tablet for 2 weeks, then 1 tablet daily 07/18/16  Yes [place], Modena Nunnery, MD  topiramate (TOPAMAX) 25 MG tablet topamax 25mg   at bedtime, increase to 50mg  at bedtime after 2 weeks 06/13/16  Yes [place], Modena Nunnery, MD  valACYclovir (VALTREX) 500 MG tablet Take 1 tablet (500 mg total) by mouth daily. 05/02/16  Yes [place], Modena Nunnery, MD  Vitamin D, Ergocalciferol, (DRISDOL) 50000 units CAPS capsule Take 1 capsule (50,000 Units total) by mouth every 7 (seven) days. 05/05/16  Yes , Modena Nunnery, MD    Family History Family History  Problem Relation Age of Onset  . Heart disease Mother   . Diabetes Mother   .  Hypertension Mother   . Cancer Sister        esophogeal    Social History Social History  Substance Use Topics  . Smoking status: Current Every Day Smoker    Packs/day: 0.15    Years: 30.00    Types: Cigarettes  . Smokeless tobacco: Never Used  . Alcohol use Yes     Comment: occas.     Allergies   Patient has no known allergies.   Review of Systems Review of Systems  Constitutional: Negative for chills and fever.  HENT: Negative for facial swelling and sore throat.   Respiratory: Negative for shortness of breath.   Cardiovascular: Positive for chest pain.  Gastrointestinal: Negative for abdominal pain, nausea and vomiting.  Genitourinary: Negative for dysuria.  Musculoskeletal: Negative for back pain.  Skin: Negative for rash and wound.  Neurological: Positive for weakness and numbness. Negative for headaches.  Psychiatric/Behavioral: The patient is not nervous/anxious.      Physical Exam Updated Vital Signs BP 106/78 (BP Location: Left Arm)   Pulse 71   Temp 98.3 F (36.8 C) (Oral)   Resp 19   Ht 5\' 2"  (1.575 m)   Wt 87.1 kg (192 lb)   LMP 09/07/2015 (Approximate)   SpO2 99%   BMI 35.12 kg/m   Physical Exam  Constitutional: She is oriented to person, place, and time. She appears well-developed and well-nourished. No distress.  HENT:  Head: Normocephalic and atraumatic.  Mouth/Throat: Oropharynx is clear and moist. No oropharyngeal exudate.  Eyes: Conjunctivae are normal. Pupils are equal, round, and reactive to light. Right eye exhibits no discharge. Left eye exhibits no discharge. No scleral icterus.  Neck: Normal range of motion. Neck supple. No thyromegaly present.  Cardiovascular: Normal rate, regular rhythm, normal heart sounds and intact distal pulses.  Exam reveals no gallop and no friction rub.   No murmur heard. Pulmonary/Chest: Effort normal and breath sounds normal. No stridor. No respiratory distress. She has no wheezes. She has no rales. She  exhibits tenderness.    Abdominal: Soft. Bowel sounds are normal. She exhibits no distension. There is no tenderness. There is no rebound and no guarding.  Musculoskeletal: She exhibits no edema.  Lymphadenopathy:    She has no cervical adenopathy.  Neurological: She is alert and oriented to person, place, and time. Coordination normal.  Skin: Skin is warm and dry. No rash noted. She is not diaphoretic. No pallor.  Psychiatric: She has a normal mood and affect. Her behavior is normal. Judgment and thought content normal.  Nursing note and vitals reviewed.    ED Treatments / Results  Labs (all labs ordered are listed, but only abnormal results are displayed) Labs Reviewed  BASIC METABOLIC PANEL  CBC  D-DIMER, QUANTITATIVE (NOT AT [facility])  Randolm Idol, ED  POCT I-STAT TROPONIN I  Randolm Idol, ED  POCT I-STAT TROPONIN I    EKG  EKG Interpretation  Date/Time:  Thursday August 24 2016 10:30:58 EDT Ventricular Rate:  92 PR Interval:    QRS Duration: 82 QT Interval:  341 QTC Calculation: 422 R Axis:   69 Text Interpretation:  Sinus rhythm No significant change since last tracing Confirmed by Orlie Dakin [id number]) on 08/24/2016 1:27:02 PM       Radiology Dg Chest 2 View  Result Date: 08/24/2016 CLINICAL DATA:  Sudden onset of left-sided chest pain associated with nausea and weakness. Also left arm numbness and tingling. Smoker. Previous CVA. EXAM: CHEST  2 VIEW COMPARISON:  Chest x-ray of March 02, 2016 FINDINGS: The lungs are well-expanded and clear. The heart and pulmonary vascularity are normal. The mediastinum is normal in width. There is no pleural effusion. The thoracic vertebral bodies are preserved in height. There are old fractures of the posterolateral aspects of the right sixth and seventh ribs new since the previous study. IMPRESSION: There is no active cardiopulmonary disease. Electronically Signed   By: David  Martinique M.D.   On: 08/24/2016 11:38     Procedures Procedures (including critical care time)  Medications Ordered in ED Medications  aspirin EC tablet 325 mg (325 mg Oral Given 08/24/16 1242)  morphine 2 MG/ML injection 4 mg (4 mg Intravenous Given 08/24/16 1358)  ketorolac (TORADOL) 30 MG/ML injection 30 mg (30 mg Intravenous Given 08/24/16 1550)     Initial Impression / Assessment and Plan / ED Course  I have reviewed the triage vital signs and the nursing notes.  Pertinent labs & imaging results that were available during my care of the patient were reviewed by me and considered in my medical decision making (see chart for details).     HEART score 3. CBC, BMP, delta troponin, d-dimer within normal limits. CXR shows no active cardiopulmonary disease. EKG shows NSR and no significant change since last tracing. Bilateral blood pressures show no significant discrepancy. Patient pain reasonably improved with morphine and Toradol in the ED. Considering patient's reproducible chest pain, this may be musculoskeletal. Patient advised to follow up with PCP and cardiology for further evaluation. I advised patient to take ibuprofen or Tylenol as prescribed over-the-counter. Strict return precautions discussed, however. Patient understands and agrees with plan. Patient vitals stable throughout ED course discharged in satisfactory condition. Patient also evaluated by Dr. Winfred Leeds who evidently patient's management and agrees with plan.  Final Clinical Impressions(s) / ED Diagnoses   Final diagnoses:  Chest pain, unspecified type    New Prescriptions New Prescriptions   No medications on file     Frederica Kuster, Hershal Coria 08/24/16 1601    Orlie Dakin, MD 08/24/16 [phone]

## 2016-09-19 ENCOUNTER — Ambulatory Visit: Payer: Managed Care, Other (non HMO) | Admitting: Family Medicine

## 2016-11-29 ENCOUNTER — Encounter: Payer: Self-pay | Admitting: Family Medicine

## 2016-12-19 ENCOUNTER — Other Ambulatory Visit: Payer: Self-pay | Admitting: Family Medicine

## 2016-12-28 ENCOUNTER — Encounter: Payer: Self-pay | Admitting: Family Medicine

## 2017-01-22 ENCOUNTER — Telehealth: Payer: Self-pay | Admitting: Family Medicine

## 2017-01-22 MED ORDER — IBUPROFEN 800 MG PO TABS: 800.0000 mg | ORAL_TABLET | Freq: Three times a day (TID) | ORAL | 0 refills | Status: DC | PRN

## 2017-01-22 NOTE — Telephone Encounter (Signed)
Okay to refill ibuprofen

## 2017-01-22 NOTE — Telephone Encounter (Signed)
[phone] PATIENT IS CALLING TO ASK WHY HER 800 MG IBUPROFIN HAS BEEN DENIED, SHE DOES HAVE BALANCE BUT HAS BEEN PAYING ON THIS

## 2017-01-22 NOTE — Telephone Encounter (Signed)
Prescription sent to pharmacy.

## 2017-01-22 NOTE — Telephone Encounter (Signed)
Call placed to patient.   Advised that no refill request for IBU has been received at [facility]. The last IBU order came from Ocie Cornfield, [place] from [facility].   Patient requested prescription for IBU 800mg .   MD please advise.

## 2017-01-25 ENCOUNTER — Telehealth: Payer: Self-pay | Admitting: *Deleted

## 2017-01-25 NOTE — Telephone Encounter (Signed)
Received call from receptionist.   States that patient called to schedule appointment and was crying so hard that she could not understand patient.   Call placed to patient to F/U. Patient reports that she is currently sitting in car outside of work on her break. States that she has severe HA and is exhausted. Also reports that nothing seems to be going right for her.   Patient denies suicidal or homicidal ideation. Advised patient to go to [facility] for treatment. Patient states that she does not want to go to ER for [facility] evaluation. Patient attempted to contact boyfriend earlier, but was unable to reach him.   Contacted patient manager to advise that patient will not be returning for the rest of shift due to medical event. Patient boyfriend reached out to contact patient. Patient disconnected call to talk with him.   Call placed to patient to F/U. States that she is still sitting outside her work talking with her BF. States that he will come pick her up and stay with her tonight. Advised if Sx worsen to go to ER. Appointment scheduled for 11/9/20018.

## 2017-01-26 ENCOUNTER — Encounter: Payer: Self-pay | Admitting: Family Medicine

## 2017-01-26 ENCOUNTER — Other Ambulatory Visit: Payer: Self-pay

## 2017-01-26 ENCOUNTER — Ambulatory Visit (INDEPENDENT_AMBULATORY_CARE_PROVIDER_SITE_OTHER): Payer: Managed Care, Other (non HMO) | Admitting: Family Medicine

## 2017-01-26 VITALS — BP 130/82 | HR 102 | Temp 98.3°F | Resp 16 | Ht 64.0 in | Wt 193.0 lb

## 2017-01-26 DIAGNOSIS — F321 Major depressive disorder, single episode, moderate: Secondary | ICD-10-CM

## 2017-01-26 DIAGNOSIS — F411 Generalized anxiety disorder: Secondary | ICD-10-CM

## 2017-01-26 MED ORDER — NORTRIPTYLINE HCL 50 MG PO CAPS: 100.0000 mg | ORAL_CAPSULE | Freq: Every day | ORAL | 2 refills | Status: DC

## 2017-01-26 MED ORDER — ESCITALOPRAM OXALATE 10 MG PO TABS: 10.0000 mg | ORAL_TABLET | Freq: Every day | ORAL | 3 refills | Status: DC

## 2017-01-26 MED ORDER — LORAZEPAM 0.5 MG PO TABS: 0.5000 mg | ORAL_TABLET | Freq: Two times a day (BID) | ORAL | 1 refills | Status: DC | PRN

## 2017-01-26 NOTE — Assessment & Plan Note (Addendum)
Current depression which is untreated.  Will have her start the pamelor ( same medication Neurology sent over for migraines) this can be used for her depression and anxiety as well,  will bridge with lorazepam 0.5 mg twice daily as needed.  She can start with 50mg  pamelor for 1 week then increase to 100 mg at bedtime she will be sent to psychotherapy.  She is unable to perform the duties on her job due to her mental distress.  I will take her out of work for 4 weeks and then we will reevaluate.  During this time she will schedule with psychotherapist.  I will obtain the information from her neuropsychiatrist with regards to her migraines.

## 2017-01-26 NOTE — Patient Instructions (Addendum)
Release of records- [facility]- [place] [phone] Note for work given Take the nortripytline 50mg  at bedtime for 1 week, then increase to 100mg  at bedtime  Take the ativan twice a day as needed  F/U 3 weeks Call [facility] [place]- [phone]

## 2017-01-26 NOTE — Telephone Encounter (Signed)
noted 

## 2017-01-26 NOTE — Progress Notes (Signed)
Subjective:    Patient ID: Susan King, female    DOB: 1970-10-23, 46 y.o.   MRN: [id number]  Patient presents for Anxiety/ Depression (episodes of feeling overwhelmed) and HA (states that she had major HA last night and medications did not resolve)   Pt here with worsening depression and anxiety. Last seen in May with multiple complaints. Since her fall last year she has complained of pain and headaches in the setting of her chronic migraines. Has had imaging of brain. I offered numerous times to send her to neurology and she declined stating it was workers comp.  She was started on lexapro 5mg  at bedtime and was suppose to f/u but did not. She called yesterday tearful and upset   She is now seeing a neurologist in [place], she has been a few medications but forgetful and having headaches a lot, she does not sleep well. But states everything is stressing her out.  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   When asked about relationships, states things are better with her boyfriend  Her relationship with Mother is not good      Review Of Systems:  GEN- denies fatigue, fever, weight loss,weakness, recent illness HEENT- denies eye drainage, change in vision, nasal discharge, CVS- denies chest pain, palpitations RESP- denies SOB, cough, wheeze ABD- denies N/V, change in stools, abd pain GU- denies dysuria, hematuria, dribbling, incontinence MSK- denies joint pain, muscle aches, injury Neuro- denies headache, dizziness, syncope, seizure activity       Objective:    BP 130/82   Pulse (!) 102   Temp 98.3 F (36.8 C) (Oral)   Resp 16   Ht 5\' 4"  (1.626 m)   Wt 193 lb (87.5 kg)   LMP 09/07/2015 (Approximate)   SpO2 98%   BMI 33.13 kg/m  GEN- NAD, alert and oriented x3 Psych- depressed affect, tearful, not anxious, well groomed, fair  eye contact, no hallucinations no SI        Assessment & Plan:      Problem List Items Addressed This Visit      Unprioritized   GAD (generalized anxiety disorder)   Relevant Medications   LORazepam (ATIVAN) 0.5 MG tablet   nortriptyline (PAMELOR) 50 MG capsule   Depression, major, single episode, moderate ([facility]) - Primary    Current depression which is untreated.  Will have her start the pamelor ( same medication Neurology sent over for migraines) this can be used for her depression and anxiety as well,  will bridge with lorazepam 0.5 mg twice daily as needed.  She can start with 50mg  pamelor for 1 week then increase to 100 mg at bedtime she will be sent to psychotherapy.  She is unable to perform the duties on her job due to her mental distress.  I will take her out of work for 4 weeks and then we will reevaluate.  During this time she will schedule with psychotherapist.  I will obtain the information from her neuropsychiatrist with regards to her migraines.        Relevant Medications   LORazepam (ATIVAN) 0.5 MG tablet   nortriptyline (PAMELOR) 50 MG capsule      Note: This dictation was prepared with Dragon dictation along with smaller phrase technology. Any transcriptional errors that result from this process are unintentional.

## 2017-01-31 ENCOUNTER — Telehealth: Payer: Self-pay | Admitting: *Deleted

## 2017-01-31 NOTE — Telephone Encounter (Signed)
Received multiple faxes for FMLA/STD forms.   1. FMLA: [place] [phone]- telephone/ [phone]- fax 2. [employer] STD~ 1- [phone]- telephone/ [phone]- fax 3. [employer] STD~ 1- [place]- fax 4. [employer]~ STD~ [phone]- telephone/ [phone]- fax 5. [employer]~ STD~ 1- [phone], ext: [phone]- telephone/ [phone]- fax  Job title: associate at [employer]  Reason FMLA/ STD requested: Depression (F32.1)/ GAD (F41.1) OV 01/26/2017 Taken out of work 01/26/2017- 02/25/2017.  Verbalized that fee may be charged and is per provider prerogative.   Forms routed to provider.

## 2017-02-01 ENCOUNTER — Encounter: Payer: Self-pay | Admitting: Family Medicine

## 2017-02-01 NOTE — Telephone Encounter (Signed)
Call placed to patient. No answer. No VM.  

## 2017-02-01 NOTE — Telephone Encounter (Signed)
Call placed to patient and patient made aware.   Patient reports that she contacted [facility] for an appointment. She is scheduled to be seen on 02/14/2017.

## 2017-02-01 NOTE — Telephone Encounter (Signed)
Noted.  Forms completed.

## 2017-02-01 NOTE — Telephone Encounter (Signed)
Call pt,  I need to know the therapy place she contacted for appointment, this needs to go on her paperwork. I gave her options to call at last visit and when appt is   Also, I will not charge for the FMLA as I took her out of work  But all the other loan forms/ disasbility forms- charge is $10 per our policy. She only needs to pay $25 total

## 2017-02-02 NOTE — Telephone Encounter (Signed)
Call placed to patient and patient made aware that forms have been completed.   FMLA faxed to [facility].   Patient will pick up all other forms at front desk and pay fee.

## 2017-02-20 ENCOUNTER — Ambulatory Visit (INDEPENDENT_AMBULATORY_CARE_PROVIDER_SITE_OTHER): Payer: Managed Care, Other (non HMO) | Admitting: Family Medicine

## 2017-02-20 ENCOUNTER — Encounter: Payer: Self-pay | Admitting: Family Medicine

## 2017-02-20 VITALS — BP 132/74 | HR 104 | Temp 98.8°F | Resp 14 | Ht 64.0 in | Wt 195.0 lb

## 2017-02-20 DIAGNOSIS — F331 Major depressive disorder, recurrent, moderate: Secondary | ICD-10-CM

## 2017-02-20 DIAGNOSIS — F411 Generalized anxiety disorder: Secondary | ICD-10-CM | POA: Diagnosis not present
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
## 2017-02-20 NOTE — Patient Instructions (Addendum)
Jan 21st is return to work date  F/U  Friday Jan 18th, 2019
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
## 2017-02-22 ENCOUNTER — Telehealth: Payer: Self-pay | Admitting: *Deleted

## 2017-02-22 NOTE — Telephone Encounter (Signed)
Received fax from [place]: [phone]~ telephone/ [phone]~ fax to extend STD papers.  Job title: associate at [employer]  Reason FMLA/ STD requested: Depression (F32.1)/ GAD (F41.1) OV 01/26/2017, 02/20/2017 Taken out of work 01/26/2017- 02/25/2017.  Verbalized that fee may be charged and is per provider prerogative.   Forms routed to provider.

## 2017-02-23 NOTE — Telephone Encounter (Signed)
Received completed forms from provider.   No charge per provider.   Faxed to [place].

## 2017-03-02 ENCOUNTER — Encounter: Payer: Self-pay | Admitting: Family Medicine

## 2017-03-09 ENCOUNTER — Telehealth: Payer: Self-pay | Admitting: *Deleted

## 2017-03-09 NOTE — Telephone Encounter (Signed)
Received fax from [employer] [phone] fax to extend disability papers for loan.   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   Forms routed to provider.

## 2017-03-09 NOTE — Telephone Encounter (Signed)
noted 

## 2017-03-12 NOTE — Telephone Encounter (Signed)
Received completed STD forms from provider.   No charge per provider.  Faxed to [employer].

## 2017-03-27 ENCOUNTER — Telehealth: Payer: Self-pay | Admitting: Family Medicine

## 2017-03-27 NOTE — Telephone Encounter (Signed)
Receivedfax from [place] fax to extend disability papers for loan.   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   Forms routed to provider.

## 2017-03-27 NOTE — Telephone Encounter (Signed)
Awaiting form

## 2017-03-27 NOTE — Telephone Encounter (Signed)
Pt dropped off disability forms to be filled out. Please fill out and fax to [phone] placed in yellow folder.

## 2017-03-27 NOTE — Telephone Encounter (Signed)
noted 

## 2017-04-06 ENCOUNTER — Encounter: Payer: Self-pay | Admitting: Family Medicine

## 2017-04-06 ENCOUNTER — Other Ambulatory Visit: Payer: Self-pay

## 2017-04-06 ENCOUNTER — Ambulatory Visit (INDEPENDENT_AMBULATORY_CARE_PROVIDER_SITE_OTHER): Payer: Managed Care, Other (non HMO) | Admitting: Family Medicine

## 2017-04-06 VITALS — BP 138/72 | HR 114 | Temp 98.7°F | Resp 16 | Ht 64.0 in | Wt 199.0 lb

## 2017-04-06 DIAGNOSIS — F331 Major depressive disorder, recurrent, moderate: Secondary | ICD-10-CM

## 2017-04-06 DIAGNOSIS — F411 Generalized anxiety disorder: Secondary | ICD-10-CM
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
## 2017-04-06 NOTE — Patient Instructions (Addendum)
F/U 4 weeks Support Group ask your therapist about this
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
## 2017-04-12 ENCOUNTER — Encounter: Payer: Self-pay | Admitting: Family Medicine

## 2017-04-12 ENCOUNTER — Telehealth: Payer: Self-pay | Admitting: *Deleted

## 2017-04-12 NOTE — Telephone Encounter (Signed)
Receivedfaxfrom [employer], [phone]- [phone]~fax to extendSTD.  Job title:associate at [employer]  Reason STDrequested: Depression (F32.1)/ GAD (F41.1) OV 01/26/2017, 02/20/2017, 04/06/2017 Taken out of work 01/26/2017-04/09/2017  Verbalized that fee may be charged and is per provider prerogative.   Forms routed to provider.

## 2017-04-16 NOTE — Telephone Encounter (Signed)
Received completed forms from provider.   No charge per provider.  Faxed to [place].

## 2017-05-08 ENCOUNTER — Ambulatory Visit: Payer: Managed Care, Other (non HMO) | Admitting: Family Medicine

## 2017-05-15 ENCOUNTER — Other Ambulatory Visit: Payer: Self-pay

## 2017-05-15 ENCOUNTER — Ambulatory Visit (INDEPENDENT_AMBULATORY_CARE_PROVIDER_SITE_OTHER): Payer: Managed Care, Other (non HMO) | Admitting: Family Medicine

## 2017-05-15 ENCOUNTER — Encounter: Payer: Self-pay | Admitting: Family Medicine

## 2017-05-15 VITALS — BP 128/64 | HR 68 | Temp 98.5°F | Resp 16 | Ht 64.0 in | Wt 200.0 lb

## 2017-05-15 DIAGNOSIS — F411 Generalized anxiety disorder: Secondary | ICD-10-CM

## 2017-05-15 DIAGNOSIS — F331 Major depressive disorder, recurrent, moderate: Secondary | ICD-10-CM | POA: Diagnosis not present

## 2017-05-15 DIAGNOSIS — R300 Dysuria: Secondary | ICD-10-CM

## 2017-05-15 LAB — URINALYSIS, ROUTINE W REFLEX MICROSCOPIC
Bacteria, UA: NONE SEEN /HPF
Bilirubin Urine: NEGATIVE
Glucose, UA: NEGATIVE
Hgb urine dipstick: NEGATIVE
Ketones, ur: NEGATIVE
Leukocytes, UA: NEGATIVE
Nitrite: NEGATIVE
RBC / HPF: NONE SEEN /HPF (ref 0–2)
SPECIFIC GRAVITY, URINE: 1.02 (ref 1.001–1.03)
WBC UA: NONE SEEN /HPF (ref 0–5)
pH: 7.5 (ref 5.0–8.0)

## 2017-05-15 LAB — MICROSCOPIC MESSAGE

## 2017-05-15 MED ORDER — ESCITALOPRAM OXALATE 10 MG PO TABS: 10.0000 mg | ORAL_TABLET | Freq: Every day | ORAL | 2 refills | Status: DC

## 2017-05-15 NOTE — Assessment & Plan Note (Signed)
Continue current medications She is starting to make some strides for the better F/u with support group today and therapist, neurologis tin March Doing well back at work

## 2017-05-15 NOTE — Patient Instructions (Signed)
F/U 3 months for PHYSICAL 

## 2017-05-15 NOTE — Progress Notes (Signed)
   Subjective:    Patient ID: Susan King, female    DOB: 1970-06-16, 47 y.o.   MRN: [id number]  Patient presents for Follow-up  Here to follow-up regarding depression.  She was last seen on 18 January.  At that time she was released back to work with restrictions.  She was placed on part-time hours for a few weeks and then transition to full-time.  She still following with her therapist she still on Pamelor and lorazepam as needed. BUT HAS NOT REQUIRED THE ATIVAN Has been on full time hours for past 2 weeks. Next therapy appt March 11.  [facility]- has appointment today  Feels like she is on the mend- she did try stopping her medications for about a week, but became veryanxious, and went back on. Now knows that she needs them consistently.  Work is going well, she is trying not to stress her self and relying on team members for help. She has good support from extended family but has yet to sit down and talk with her mother, as she has a "heart problem" and she fears she will cause her distress.  She does feel like overall she is in a better place    F/U with Neurology in March, taking Topamax, had a severe headache yesterday, but has increased her water and this has helped  On review of medications, she has actually been taking Lexapro 10mg , pamelor 100mg  and topamax only. Has script valtrex but not taking  Dysuria for past few days, no abd pain , no N/V, no fever, no vaginal discharge  Review Of Systems:  GEN- denies fatigue, fever, weight loss,weakness, recent illness HEENT- denies eye drainage, change in vision, nasal discharge, CVS- denies chest pain, palpitations RESP- denies SOB, cough, wheeze ABD- denies N/V, change in stools, abd pain GU- +dysuria, denies hematuria, dribbling, incontinence MSK- denies joint pain, muscle aches, injury Neuro- denies headache, dizziness, syncope, seizure activity       Objective:    BP 128/64   Pulse 68   Temp 98.5 F  (36.9 C) (Oral)   Resp 16   Ht 5\' 4"  (1.626 m)   Wt 200 lb (90.7 kg)   LMP 09/07/2015 (Approximate)   SpO2 98%   BMI 34.33 kg/m  GEN- NAD, alert and oriented x3 CVS-RRR, no murmur RESP-CTAB ABD-NABS, soft, NT,ND no CVA tenderness Psych- normal affect and mood         Assessment & Plan:      Problem List Items Addressed This Visit      Unprioritized   GAD (generalized anxiety disorder)   Relevant Medications   escitalopram (LEXAPRO) 10 MG tablet   Major depression, recurrent ([place])    Continue current medications She is starting to make some strides for the better F/u with support group today and therapist, neurologis tin March Doing well back at work       Relevant Medications   escitalopram (LEXAPRO) 10 MG tablet    Other Visit Diagnoses    Dysuria    -  Primary   Relevant Orders   Urinalysis, Routine w reflex microscopic (Completed)      Note: This dictation was prepared with Dragon dictation along with smaller phrase technology. Any transcriptional errors that result from this process are unintentional.

## 2017-05-23 ENCOUNTER — Encounter: Payer: Self-pay | Admitting: Family Medicine

## 2017-06-25 ENCOUNTER — Encounter: Payer: Self-pay | Admitting: Family Medicine

## 2017-07-07 ENCOUNTER — Other Ambulatory Visit: Payer: Self-pay | Admitting: Family Medicine

## 2017-09-28 ENCOUNTER — Other Ambulatory Visit: Payer: Self-pay | Admitting: Family Medicine

## 2017-10-08 ENCOUNTER — Encounter: Payer: Self-pay | Admitting: Family Medicine

## 2017-10-30 ENCOUNTER — Encounter: Payer: Managed Care, Other (non HMO) | Admitting: Family Medicine

## 2018-01-14 ENCOUNTER — Telehealth: Payer: Self-pay | Admitting: Family Medicine

## 2018-01-14 MED ORDER — VALACYCLOVIR HCL 500 MG PO TABS: 500.0000 mg | ORAL_TABLET | Freq: Every day | ORAL | 2 refills | Status: DC

## 2018-01-14 NOTE — Telephone Encounter (Signed)
Patient calling to get valtrex refilled  [facility]

## 2018-01-14 NOTE — Telephone Encounter (Signed)
Prescription sent to pharmacy.

## 2018-04-26 ENCOUNTER — Encounter: Payer: Medicaid Other | Admitting: Family Medicine

## 2018-06-07 ENCOUNTER — Other Ambulatory Visit: Payer: Self-pay | Admitting: Family Medicine

## 2018-06-07 ENCOUNTER — Other Ambulatory Visit: Payer: Self-pay

## 2018-06-07 ENCOUNTER — Ambulatory Visit: Payer: Managed Care, Other (non HMO) | Admitting: Family Medicine

## 2018-06-07 ENCOUNTER — Ambulatory Visit: Payer: Self-pay | Admitting: Family Medicine

## 2018-06-07 VITALS — BP 102/70 | HR 90 | Temp 98.7°F | Resp 18 | Ht 62.5 in | Wt 196.2 lb

## 2018-06-07 DIAGNOSIS — G43709 Chronic migraine without aura, not intractable, without status migrainosus: Secondary | ICD-10-CM

## 2018-06-07 DIAGNOSIS — F331 Major depressive disorder, recurrent, moderate: Secondary | ICD-10-CM | POA: Diagnosis not present

## 2018-06-07 MED ORDER — BUTALBITAL-APAP-CAFFEINE 50-325-40 MG PO TABS: 1.0000 | ORAL_TABLET | Freq: Four times a day (QID) | ORAL | 1 refills | Status: DC | PRN

## 2018-06-07 MED ORDER — ESCITALOPRAM OXALATE 10 MG PO TABS: 10.0000 mg | ORAL_TABLET | Freq: Every day | ORAL | 2 refills | Status: DC

## 2018-06-07 MED ORDER — CLOTRIMAZOLE-BETAMETHASONE 1-0.05 % EX CREA: TOPICAL_CREAM | CUTANEOUS | 1 refills | Status: DC

## 2018-06-07 MED ORDER — IBUPROFEN 800 MG PO TABS: 800.0000 mg | ORAL_TABLET | Freq: Three times a day (TID) | ORAL | 2 refills | Status: DC | PRN

## 2018-06-07 MED ORDER — KETOROLAC TROMETHAMINE 60 MG/2ML IM SOLN
60.0000 mg | Freq: Once | INTRAMUSCULAR | Status: AC
  Administered 2018-06-07: 60 mg via INTRAMUSCULAR

## 2018-06-07 MED ORDER — VALACYCLOVIR HCL 500 MG PO TABS: 500.0000 mg | ORAL_TABLET | Freq: Every day | ORAL | 2 refills | Status: DC

## 2018-06-07 NOTE — Progress Notes (Signed)
Toradol 60 mg administered in left ventrogluteal. Pt tolerated well.
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
## 2018-06-07 NOTE — Patient Instructions (Signed)
F/U Fall for physical

## 2018-06-09 ENCOUNTER — Encounter: Payer: Self-pay | Admitting: Family Medicine

## 2018-06-09 NOTE — Assessment & Plan Note (Signed)
Restart lexapro
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
## 2018-06-27 ENCOUNTER — Telehealth: Payer: Self-pay | Admitting: Family Medicine

## 2018-06-27 NOTE — Telephone Encounter (Signed)
Patient called in stating that her left arm has been swelling for several weeks. She denies chest pain, SOB, back pain, or elevated blood pressure. Patient states that swelling is going down from her elbow into her wrist. Tried to schedule patient and appointment today and patient stated that she could not come today. Asked if she could come on Monday as we are closed. Patient took appointment. Advised if any other symptoms such as SOB, chest pain, back pain, or elevated BP she will need to be seen at the ER immediately . Patient verbalized understanding

## 2018-07-01 ENCOUNTER — Ambulatory Visit: Payer: Self-pay | Admitting: Family Medicine

## 2018-07-02 ENCOUNTER — Ambulatory Visit: Payer: 59 | Admitting: Family Medicine

## 2018-07-02 ENCOUNTER — Other Ambulatory Visit: Payer: Self-pay

## 2018-07-02 ENCOUNTER — Encounter: Payer: Self-pay | Admitting: Family Medicine

## 2018-07-02 VITALS — BP 110/64 | HR 82 | Temp 98.4°F | Resp 16 | Ht 62.5 in | Wt 199.0 lb

## 2018-07-02 DIAGNOSIS — S46012D Strain of muscle(s) and tendon(s) of the rotator cuff of left shoulder, subsequent encounter: Secondary | ICD-10-CM | POA: Diagnosis not present

## 2018-07-02 DIAGNOSIS — S46012A Strain of muscle(s) and tendon(s) of the rotator cuff of left shoulder, initial encounter: Secondary | ICD-10-CM

## 2018-07-02 DIAGNOSIS — M25512 Pain in left shoulder: Secondary | ICD-10-CM | POA: Diagnosis not present

## 2018-07-02 LAB — CBC WITH DIFFERENTIAL/PLATELET
Absolute Monocytes: 411 cells/uL (ref 200–950)
Basophils Absolute: 52 cells/uL (ref 0–200)
Basophils Relative: 1 %
Eosinophils Absolute: 260 cells/uL (ref 15–500)
Eosinophils Relative: 5 %
HCT: 40.3 % (ref 35.0–45.0)
Hemoglobin: 13.4 g/dL (ref 11.7–15.5)
Lymphs Abs: 1685 cells/uL (ref 850–3900)
MCH: 27.7 pg (ref 27.0–33.0)
MCHC: 33.3 g/dL (ref 32.0–36.0)
MCV: 83.3 fL (ref 80.0–100.0)
MPV: 11.7 fL (ref 7.5–12.5)
Monocytes Relative: 7.9 %
Neutro Abs: 2792 cells/uL (ref 1500–7800)
Neutrophils Relative %: 53.7 %
Platelets: 249 10*3/uL (ref 140–400)
RBC: 4.84 10*6/uL (ref 3.80–5.10)
RDW: 13.1 % (ref 11.0–15.0)
Total Lymphocyte: 32.4 %
WBC: 5.2 10*3/uL (ref 3.8–10.8)

## 2018-07-02 LAB — COMPREHENSIVE METABOLIC PANEL
AG Ratio: 1.3 (calc) (ref 1.0–2.5)
ALT: 21 U/L (ref 6–29)
AST: 14 U/L (ref 10–35)
Albumin: 4.1 g/dL (ref 3.6–5.1)
Alkaline phosphatase (APISO): 82 U/L (ref 31–125)
BUN: 13 mg/dL (ref 7–25)
CO2: 26 mmol/L (ref 20–32)
Calcium: 9.3 mg/dL (ref 8.6–10.2)
Chloride: 107 mmol/L (ref 98–110)
Creat: 0.86 mg/dL (ref 0.50–1.10)
Globulin: 3.2 g/dL (calc) (ref 1.9–3.7)
Glucose, Bld: 88 mg/dL (ref 65–99)
Potassium: 4.8 mmol/L (ref 3.5–5.3)
Sodium: 139 mmol/L (ref 135–146)
Total Bilirubin: 0.5 mg/dL (ref 0.2–1.2)
Total Protein: 7.3 g/dL (ref 6.1–8.1)

## 2018-07-02 NOTE — Patient Instructions (Addendum)
Take the ibuprofen every 8 hours with food  Ice the shoulder  Get xray at [facility] We will call with lab results  F/U as previous for physical

## 2018-07-02 NOTE — Progress Notes (Signed)
Subjective:    Patient ID: Susan King, female    DOB: 1970/12/22, 48 y.o.   MRN: [id number]  Patient presents for L Arm Pain (x2 weeks- pain in shoulder radiating down arm towards wrist- reports that she has swelling in arm)  Patient here with left shoulder pain for the past 2 weeks.  She states that she is concerned about bone cancer because her uncle had it.  She denies any particular injury to her shoulder.  She states it has been swelling from her upper shoulder all the way down to her wrist.  She has not taken any Tylenol or ibuprofen has not used any ice.  States that she did use BenGay a few times but it did not help.  She denies any tingling or numbness in her hand.  Denies any neck pain.  She does have chronic migraines which are unchanged per previous visit. Does admit to high stress.  She is afraid that something is wrong with her and it will be caught too late.  She also states that with the COVID and the fact that she is going into work very stressed there so she is grateful for her job.  Every time she looks at the TV and on social media people are dying. Is taking her Lexapro as prescribed feels in general it does help with her symptoms does not want to make any changes.  Review Of Systems:  GEN- denies fatigue, fever, weight loss,weakness, recent illness HEENT- denies eye drainage, change in vision, nasal discharge, CVS- denies chest pain, palpitations RESP- denies SOB, cough, wheeze ABD- denies N/V, change in stools, abd pain GU- denies dysuria, hematuria, dribbling, incontinence MSK- + joint pain, muscle aches, injury Neuro- denies headache, dizziness, syncope, seizure activity       Objective:    BP 110/64   Pulse 82   Temp 98.4 F (36.9 C) (Oral)   Resp 16   Ht 5' 2.5" (1.588 m)   Wt 199 lb (90.3 kg)   LMP 09/07/2015 (Approximate)   BMI 35.82 kg/m  GEN- NAD, alert and oriented x3 Neck- Supple,  FROM MSK- Normal inspection bilat, no swelling of arm/shoulder  or elbow, FROM Right UE, rotator cuff in tact, Left upper ext, decreased elevation above shoulder height, +empty can left side, bicep in tact, triceps in tact, FROM elbow, NT at elbow, FROM forearm TTP post and ant shoulder with palptation  Pulses- Radial  2+        Assessment & Plan:      Problem List Items Addressed This Visit    None    Visit Diagnoses    Acute pain of left shoulder    -  Primary   Acute left shoulder pain no particular injury.  Discussed in detail her concerns about bone cancer.  She has 2 weeks of pain no other known malignancy I think she is under a lot of stress and anxiety which brought her in.  I do not see any significant swelling in her arm there is no sign of bursitis at the elbow.  Things are more consistent with rotator cuff strain possible she slept wrong or lifted something that she does not recall.  She has not tried any thing oral at home she does have ibuprofen 800 mg which we will use 3 times a day and have her ice and do some range of motion with her arm.  I did offer CBC and metabolic panel she has not had labs drawn since  2018 she was due for her physical in the fall.  She will go ahead and have this done but there are no B symptoms to suggest that she has a malignancy also discussed that bone cancer is very rare.  Decided that if she does not improve with her anti-inflammatories that she can get an x-ray of her shoulder I suspect she may have some mild arthritis as she has had a physical job through the years   Relevant Orders   DG Shoulder Left   CBC with Differential/Platelet   Comprehensive metabolic panel   Strain of left rotator cuff capsule, subsequent encounter       Relevant Orders   DG Shoulder Left   CBC with Differential/Platelet   Comprehensive metabolic panel      Note: This dictation was prepared with Dragon dictation along with smaller phrase technology. Any transcriptional errors that result from this process are unintentional.

## 2018-07-15 ENCOUNTER — Other Ambulatory Visit: Payer: Self-pay

## 2018-07-15 ENCOUNTER — Ambulatory Visit (HOSPITAL_COMMUNITY)
Admission: RE | Admit: 2018-07-15 | Discharge: 2018-07-15 | Disposition: A | Discharge status: Home / Self Care | Payer: 59 | Source: Ambulatory Visit | Attending: Family Medicine | Admitting: Family Medicine

## 2018-07-15 DIAGNOSIS — M25512 Pain in left shoulder: Secondary | ICD-10-CM

## 2018-07-15 DIAGNOSIS — S46012D Strain of muscle(s) and tendon(s) of the rotator cuff of left shoulder, subsequent encounter: Secondary | ICD-10-CM | POA: Insufficient documentation

## 2018-08-15 ENCOUNTER — Encounter: Payer: Self-pay | Admitting: Family Medicine

## 2018-08-15 ENCOUNTER — Other Ambulatory Visit: Payer: Self-pay

## 2018-08-15 ENCOUNTER — Ambulatory Visit: Payer: 59 | Admitting: Family Medicine

## 2018-08-15 VITALS — BP 118/90 | HR 100 | Temp 98.6°F | Resp 16 | Ht 62.5 in | Wt 197.0 lb

## 2018-08-15 DIAGNOSIS — H8110 Benign paroxysmal vertigo, unspecified ear: Secondary | ICD-10-CM

## 2018-08-15 MED ORDER — MECLIZINE HCL 25 MG PO TABS: 25.0000 mg | ORAL_TABLET | Freq: Three times a day (TID) | ORAL | 0 refills | Status: DC | PRN

## 2018-08-15 NOTE — Progress Notes (Signed)
Subjective:     Patient ID: Susan King, female   DOB: February 07, 1971, 48 y.o.   MRN: [id number]  HPI Patient developed lightheadedness suddenly 48 hours ago.  She was feeling well.  She bent over to give her dog some dog food and suddenly the room began to spin.  She felt extremely dizzy and lightheaded.  She had to leave work early yesterday because every time she would stand up or try to walk or turn her head she will feel lightheaded and dizzy.  She denies any syncope or near syncope.  When the patient describes lightheadedness, she actually means a symptom of movement or rotation.  This morning she felt better and went back to work however as soon as she got to work and started walking, she would again feel vertigo in the room spinning whenever she would lift bend over or turn her head.  As long she sits still, the dizziness improves.  Movement seems to trigger the dizziness.  She has a positive Dix-Hallpike maneuver to the right today. Past Medical History:  Diagnosis Date  . Headache    Migraines  . HSV-2 (herpes simplex virus 2) infection   . Stroke [facility]) 2008   slight, no residual   Past Surgical History:  Procedure Laterality Date  . APPENDECTOMY    . LAPAROSCOPIC VAGINAL HYSTERECTOMY WITH SALPINGECTOMY Bilateral 10/15/2015   Procedure: LAPAROSCOPIC ASSISTED VAGINAL HYSTERECTOMY WITH SALPINGECTOMY;  Surgeon: Eldred Manges, MD;  Location: [facility];  Service: Gynecology;  Laterality: Bilateral;  3 hours   Current Outpatient Medications on File Prior to Visit  Medication Sig Dispense Refill  . butalbital-acetaminophen-caffeine (FIORICET, ESGIC) 50-325-40 MG tablet Take 1 tablet by mouth every 6 (six) hours as needed for headache. 30 tablet 1  . clotrimazole-betamethasone (LOTRISONE) cream APPLY TO AFFECTED AREA(S) TWO TIMES A DAY AS DIRECTED 45 g 1  . escitalopram (LEXAPRO) 10 MG tablet TAKE ONE TABLET BY MOUTH AT BEDTIME 30 tablet 1  . ibuprofen (IBU) 800 MG tablet Take 1 tablet (800  mg total) by mouth every 8 (eight) hours as needed. 30 tablet 2  . nortriptyline (PAMELOR) 50 MG capsule Take 2 capsules (100 mg total) at bedtime by mouth. 60 capsule 2  . topiramate (TOPAMAX) 50 MG tablet TAKE 1/2 TABLET BY MOUTH TWICE DAILY X2 WEEKS, THEN TAKE 1 TABLET DAILY THEREAFTER  0  . valACYclovir (VALTREX) 500 MG tablet Take 1 tablet (500 mg total) by mouth daily. 30 tablet 2   No current facility-administered medications on file prior to visit.    No Known Allergies Social History   Socioeconomic History  . Marital status: Single    Spouse name: Not on file  . Number of children: Not on file  . Years of education: Not on file  . Highest education level: Not on file  Occupational History  . Not on file  Social Needs  . Financial resource strain: Not on file  . Food insecurity:    Worry: Not on file    Inability: Not on file  . Transportation needs:    Medical: Not on file    Non-medical: Not on file  Tobacco Use  . Smoking status: Current Every Day Smoker    Packs/day: 0.15    Years: 30.00    Pack years: 4.50    Types: Cigarettes  . Smokeless tobacco: Never Used  Substance and Sexual Activity  . Alcohol use: Yes    Comment: occas.  . Drug use: No  . Sexual  activity: Not Currently    Birth control/protection: None  Lifestyle  . Physical activity:    Days per week: Not on file    Minutes per session: Not on file  . Stress: Not on file  Relationships  . Social connections:    Talks on phone: Not on file    Gets together: Not on file    Attends religious service: Not on file    Active member of club or organization: Not on file    Attends meetings of clubs or organizations: Not on file    Relationship status: Not on file  . Intimate partner violence:    Fear of current or ex partner: Not on file    Emotionally abused: Not on file    Physically abused: Not on file    Forced sexual activity: Not on file  Other Topics Concern  . Not on file  Social  History Narrative  . Not on file     Review of Systems  All other systems reviewed and are negative.      Objective:   Physical Exam Vitals signs reviewed.  Constitutional:      Appearance: Normal appearance. She is not ill-appearing, toxic-appearing or diaphoretic.  HENT:     Head: Normocephalic and atraumatic.     Right Ear: Tympanic membrane and ear canal normal.     Left Ear: Tympanic membrane and ear canal normal.     Nose: Nose normal. No congestion or rhinorrhea.  Cardiovascular:     Rate and Rhythm: Normal rate and regular rhythm.     Heart sounds: Normal heart sounds.  Pulmonary:     Effort: Pulmonary effort is normal. No respiratory distress.     Breath sounds: Normal breath sounds. No wheezing.  Neurological:     General: No focal deficit present.     Mental Status: She is alert and oriented to person, place, and time. Mental status is at baseline.     Cranial Nerves: No cranial nerve deficit.     Sensory: No sensory deficit.     Motor: No weakness.     Coordination: Coordination normal.     Gait: Gait normal.        Assessment:     BPPV (benign paroxysmal positional vertigo), unspecified laterality      Plan:     Patient has benign paroxysmal positional vertigo.  Use meclizine 25 mg every 8 hours as needed for vertigo.  Recommended relative rest.  This should gradually improve over the next week.  Recommended avoiding activities where patient could become injured if she became dizzy such as driving, climbing ladders, etc.  Recheck if symptoms worsen or change in any way.

## 2018-09-30 ENCOUNTER — Ambulatory Visit: Payer: 59 | Admitting: Family Medicine

## 2018-12-02 ENCOUNTER — Ambulatory Visit: Payer: 59 | Admitting: Family Medicine

## 2018-12-02 ENCOUNTER — Encounter: Payer: Self-pay | Admitting: Family Medicine

## 2018-12-02 ENCOUNTER — Other Ambulatory Visit: Payer: Self-pay

## 2018-12-02 VITALS — BP 126/72 | HR 84 | Temp 98.7°F | Resp 14 | Ht 62.5 in | Wt 203.0 lb

## 2018-12-02 DIAGNOSIS — G8929 Other chronic pain: Secondary | ICD-10-CM

## 2018-12-02 DIAGNOSIS — M25512 Pain in left shoulder: Secondary | ICD-10-CM

## 2018-12-02 DIAGNOSIS — G43709 Chronic migraine without aura, not intractable, without status migrainosus: Secondary | ICD-10-CM | POA: Diagnosis not present

## 2018-12-02 DIAGNOSIS — S46012D Strain of muscle(s) and tendon(s) of the rotator cuff of left shoulder, subsequent encounter: Secondary | ICD-10-CM

## 2018-12-02 MED ORDER — PROMETHAZINE HCL 12.5 MG PO TABS: 12.5000 mg | ORAL_TABLET | Freq: Three times a day (TID) | ORAL | 0 refills | Status: DC | PRN

## 2018-12-02 MED ORDER — HYDROCODONE-ACETAMINOPHEN 5-325 MG PO TABS: 1.0000 | ORAL_TABLET | Freq: Four times a day (QID) | ORAL | 0 refills | Status: DC | PRN

## 2018-12-02 MED ORDER — KETOROLAC TROMETHAMINE 60 MG/2ML IM SOLN
60.0000 mg | Freq: Once | INTRAMUSCULAR | Status: AC
  Administered 2018-12-02: 60 mg via INTRAMUSCULAR

## 2018-12-02 MED ORDER — PROMETHAZINE HCL 25 MG/ML IJ SOLN
12.5000 mg | Freq: Once | INTRAMUSCULAR | Status: AC
  Administered 2018-12-02: 12.5 mg via INTRAMUSCULAR

## 2018-12-02 NOTE — Patient Instructions (Addendum)
Pain medication and nausea medication sent to pharmacy Referral to orthopedics for your shoulder  F/U as previous

## 2018-12-02 NOTE — Progress Notes (Signed)
Subjective:    Patient ID: Susan King, female    DOB: [birth year], 48 y.o.   MRN: [id number]  Patient presents for Headache (began on 11/29/2018- nausea, diarrhea, light sensitivity)   Pt followed by neurologist in [place], her migraines have worsened recently. Now on topmax 50mg  Bid and Ubrevly, she was taking off the notriptyline.  Her current headache with nausea, the medications, light sensitivity  she has been in the bed   appetite has been decreased   Oct 22nd    Missed work today  She also continues to have the left shoulder pain which we looked at back in March.  She has pain when she raises above shoulder height level the pain will go down from the top of the shoulder to her biceps like to proceed with the neck step x-ray was negative states helped a little  Review Of Systems:  GEN- denies fatigue, fever, weight loss,weakness, recent illness HEENT- denies eye drainage, change in vision, nasal discharge, CVS- denies chest pain, palpitations RESP- denies SOB, cough, wheeze ABD-+ N/ no V, +change in stools, abd pain GU- denies dysuria, hematuria, dribbling, incontinence MSK- denies joint pain, muscle aches, injury Neuro- + headache, dizziness, syncope, seizure activity       Objective:    BP 126/72   Pulse 84   Temp 98.7 F (37.1 C) (Oral)   Resp 14   Ht 5' 2.5" (1.588 m)   Wt 203 lb (92.1 kg)   LMP 09/07/2015 (Approximate)   SpO2 96%   BMI 36.54 kg/m  GEN- NAD, alert and oriented x3 HEENT- PERRL, EOMI, non injected sclera, pink conjunctiva, MMM, oropharynx clear Neck- Supple, no LAD  CVS- RRR, no murmur RESP-CTAB ABD-NABS,soft,NT,ND NEURO-CNII-XII in tact no focal deficits  MSK- fair ROM left upper ext compared to right, neg empty can, biceps in tact  EXT- No edema Pulses- Radial  2+        Assessment & Plan:      Problem List Items Addressed This Visit      Unprioritized   Migraines - Primary    Acute migraine she needs abortive treatment.   Here in the office have given her Phenergan 12.5 mg IM and Toradol 60 mg.  I also sent over hydrocodone.  She is in the middle of having her medications changed around advised that she call her headache specialist for earlier appointment or further recommendations.  If her headache does not improve she should go to the nearest emergency room for treatment.  She lives less than 30 minutes away and felt comfortable driving with the Phenergan advised that it may make her a little sleepy.  Regarding her shoulder she has been referred to orthopedics      Relevant Medications   Ubrogepant (UBRELVY) 100 MG TABS   traZODone (DESYREL) 50 MG tablet   HYDROcodone-acetaminophen (NORCO) 5-325 MG tablet    Other Visit Diagnoses    Strain of left rotator cuff capsule, subsequent encounter       Relevant Orders   Ambulatory referral to Orthopedic Surgery   Chronic left shoulder pain       Relevant Medications   traZODone (DESYREL) 50 MG tablet   HYDROcodone-acetaminophen (NORCO) 5-325 MG tablet   ketorolac (TORADOL) injection 60 mg (Completed)   Other Relevant Orders   Ambulatory referral to Orthopedic Surgery      Note: This dictation was prepared with Dragon dictation along with smaller phrase technology. Any transcriptional errors that result from this process are unintentional.

## 2018-12-02 NOTE — Assessment & Plan Note (Signed)
Acute migraine she needs abortive treatment.  Here in the office have given her Phenergan 12.5 mg IM and Toradol 60 mg.  I also sent over hydrocodone.  She is in the middle of having her medications changed around advised that she call her headache specialist for earlier appointment or further recommendations.  If her headache does not improve she should go to the nearest emergency room for treatment.  She lives less than 30 minutes away and felt comfortable driving with the Phenergan advised that it may make her a little sleepy.  Regarding her shoulder she has been referred to orthopedics

## 2018-12-03 ENCOUNTER — Emergency Department (HOSPITAL_BASED_OUTPATIENT_CLINIC_OR_DEPARTMENT_OTHER)
Admission: EM | Admit: 2018-12-03 | Discharge: 2018-12-03 | Disposition: A | Discharge status: Home / Self Care | Payer: 59 | Attending: Emergency Medicine | Admitting: Emergency Medicine

## 2018-12-03 ENCOUNTER — Encounter (HOSPITAL_BASED_OUTPATIENT_CLINIC_OR_DEPARTMENT_OTHER): Payer: Self-pay

## 2018-12-03 ENCOUNTER — Other Ambulatory Visit: Payer: Self-pay

## 2018-12-03 DIAGNOSIS — Z79899 Other long term (current) drug therapy: Secondary | ICD-10-CM | POA: Insufficient documentation

## 2018-12-03 DIAGNOSIS — R945 Abnormal results of liver function studies: Secondary | ICD-10-CM | POA: Diagnosis not present

## 2018-12-03 DIAGNOSIS — R197 Diarrhea, unspecified: Secondary | ICD-10-CM | POA: Diagnosis not present

## 2018-12-03 DIAGNOSIS — F17210 Nicotine dependence, cigarettes, uncomplicated: Secondary | ICD-10-CM | POA: Insufficient documentation

## 2018-12-03 DIAGNOSIS — Z20828 Contact with and (suspected) exposure to other viral communicable diseases: Secondary | ICD-10-CM | POA: Diagnosis not present

## 2018-12-03 DIAGNOSIS — Z8673 Personal history of transient ischemic attack (TIA), and cerebral infarction without residual deficits: Secondary | ICD-10-CM | POA: Diagnosis not present

## 2018-12-03 DIAGNOSIS — R519 Headache, unspecified: Secondary | ICD-10-CM

## 2018-12-03 DIAGNOSIS — R748 Abnormal levels of other serum enzymes: Secondary | ICD-10-CM

## 2018-12-03 DIAGNOSIS — R51 Headache: Secondary | ICD-10-CM | POA: Diagnosis present

## 2018-12-03 DIAGNOSIS — Z20822 Contact with and (suspected) exposure to covid-19: Secondary | ICD-10-CM

## 2018-12-03 LAB — COMPREHENSIVE METABOLIC PANEL
ALT: 89 U/L — ABNORMAL HIGH (ref 0–44)
AST: 56 U/L — ABNORMAL HIGH (ref 15–41)
Albumin: 3.6 g/dL (ref 3.5–5.0)
Alkaline Phosphatase: 75 U/L (ref 38–126)
Anion gap: 8 (ref 5–15)
BUN: 14 mg/dL (ref 6–20)
CO2: 22 mmol/L (ref 22–32)
Calcium: 8.7 mg/dL — ABNORMAL LOW (ref 8.9–10.3)
Chloride: 108 mmol/L (ref 98–111)
Creatinine, Ser: 1.03 mg/dL — ABNORMAL HIGH (ref 0.44–1.00)
GFR calc Af Amer: >60 mL/min (ref >60)
GFR calc non Af Amer: >60 mL/min (ref >60)
Glucose, Bld: 88 mg/dL (ref 70–99)
Potassium: 3.6 mmol/L (ref 3.5–5.1)
Sodium: 138 mmol/L (ref 135–145)
Total Bilirubin: 0.9 mg/dL (ref 0.3–1.2)
Total Protein: 7.1 g/dL (ref 6.5–8.1)

## 2018-12-03 LAB — CBC WITH DIFFERENTIAL/PLATELET
Abs Immature Granulocytes: 0.03 10*3/uL (ref 0.00–0.07)
Basophils Absolute: 0 10*3/uL (ref 0.0–0.1)
Basophils Relative: 1 %
Eosinophils Absolute: 0.2 10*3/uL (ref 0.0–0.5)
Eosinophils Relative: 2 %
HCT: 39.1 % (ref 36.0–46.0)
Hemoglobin: 12.4 g/dL (ref 12.0–15.0)
Immature Granulocytes: 0 %
Lymphocytes Relative: 18 %
Lymphs Abs: 1.6 10*3/uL (ref 0.7–4.0)
MCH: 27.9 pg (ref 26.0–34.0)
MCHC: 31.7 g/dL (ref 30.0–36.0)
MCV: 88.1 fL (ref 80.0–100.0)
Monocytes Absolute: 0.6 10*3/uL (ref 0.1–1.0)
Monocytes Relative: 7 %
Neutro Abs: 6.5 10*3/uL (ref 1.7–7.7)
Neutrophils Relative %: 72 %
Platelets: 229 10*3/uL (ref 150–400)
RBC: 4.44 MIL/uL (ref 3.87–5.11)
RDW: 13.5 % (ref 11.5–15.5)
WBC: 8.9 10*3/uL (ref 4.0–10.5)
nRBC: 0 % (ref 0.0–0.2)

## 2018-12-03 MED ORDER — ACETAMINOPHEN 325 MG PO TABS
650.0000 mg | ORAL_TABLET | Freq: Once | ORAL | Status: DC
  Filled 2018-12-03: qty 2

## 2018-12-03 MED ORDER — DIPHENHYDRAMINE HCL 25 MG PO TABS: 25.0000 mg | ORAL_TABLET | Freq: Once | ORAL | 0 refills | Status: DC

## 2018-12-03 MED ORDER — SODIUM CHLORIDE 0.9 % IV BOLUS
1000.0000 mL | Freq: Once | INTRAVENOUS | Status: AC
  Administered 2018-12-03: 1000 mL via INTRAVENOUS

## 2018-12-03 MED ORDER — KETOROLAC TROMETHAMINE 30 MG/ML IJ SOLN
30.0000 mg | Freq: Once | INTRAMUSCULAR | Status: AC
  Administered 2018-12-03: 30 mg via INTRAVENOUS
  Filled 2018-12-03: qty 1

## 2018-12-03 MED ORDER — BUTALBITAL-APAP-CAFFEINE 50-325-40 MG PO TABS
1.0000 | ORAL_TABLET | Freq: Once | ORAL | Status: DC
  Filled 2018-12-03: qty 1

## 2018-12-03 MED ORDER — METOCLOPRAMIDE HCL 10 MG PO TABS: 10.0000 mg | ORAL_TABLET | Freq: Four times a day (QID) | ORAL | 0 refills | Status: DC

## 2018-12-03 MED ORDER — ACETAMINOPHEN 500 MG PO TABS
1000.0000 mg | ORAL_TABLET | Freq: Once | ORAL | Status: AC
  Administered 2018-12-03: 1000 mg via ORAL
  Filled 2018-12-03: qty 2

## 2018-12-03 NOTE — ED Triage Notes (Signed)
Pt c/o HA, diarrhea started 9/11-chills started last night-states she was seen by PCP yesterday for "migraine"-states she feels no better-NAD-steady gait

## 2018-12-03 NOTE — Discharge Instructions (Addendum)
To help relieve your headache you make take one tablet of reglan and on tablet of benadryl. You should not drive, drink alcohol, or operate machinery after taking his medications as it can make you very drowsy.  ----  Today you were were tested for the coronavirus.  The results will be available in the next 3 to 5 days.  If the results are positive the hospital will contact you.  If they are negative the hospital would not contact you.  You will need to self quarantine until you are aware of your results.  If they are positive you will need to self quarantine as directed below.  You should self isolate while awaiting your test results. If results are positive, you should be isolated for at least 7 days since the onset of your symptoms AND >72 hours after symptoms resolution (absence of fever without the use of fever reducing medication and improvement in respiratory symptoms), whichever is longer  ---  Please follow up with your primary care provider within 5-7 days for re-evaluation of your symptoms.  You also need to have your labs rechecked within about a week to reevaluate your liver function.  Please return to the emergency department for any new or worsening symptoms.

## 2018-12-03 NOTE — ED Provider Notes (Signed)
[facility] EMERGENCY DEPARTMENT Provider Note   CSN: [id number] Arrival date & time: 12/03/18  1158     History   Chief Complaint Chief Complaint  Patient presents with  . Headache    HPI Susan King is a 48 y.o. female.     HPI   Pt is a 48 y/o female with a h/o migraines, HSV, CVA, who presents to the ED today for eval of a headache that started 5 days ago. Headache is mostly diffuse. Rates pain 8/10. Pain feels like a throbbing pain. Overall, pain feels similar to prior headaches but it has lasted longer than normal. Denies associated vision changes, dizziness/lightheadedness, numbness, weakness, balance issues.   States she has also had diarrhea for the same time period.  She started having chills last night. She has not taken her temp.  She also c/o nausea. Denies abd pain, vomiting, cough, congestion, sore throat, chest pain, or shortness of breath.   Denies any known COVID exposures. She has had some positive COVID cases at her job but she is unaware is she has been around these people.   States that she follows with a neurologist in [place] and is currently undergoing medication changes. She recently started a ubrogepant and trazodone about 1-1.5 months ago.   Past Medical History:  Diagnosis Date  . Headache    Migraines  . HSV-2 (herpes simplex virus 2) infection   . Stroke [facility]) 2008   slight, no residual    Patient Active Problem List   Diagnosis Date Noted  . GAD (generalized anxiety disorder) 01/26/2017  . Migraines 07/18/2016  . Major depression, recurrent ([place]) 07/18/2016  . Fibroids, subserous 10/15/2015  . Uterus, adenomyosis 10/14/2015  . Herpes simplex type 2 infection 06/15/2015  . Obesity 06/17/2013  . Tobacco use disorder 06/17/2013  . History of CVA in adulthood 03/20/2006    Past Surgical History:  Procedure Laterality Date  . APPENDECTOMY    . LAPAROSCOPIC VAGINAL HYSTERECTOMY WITH SALPINGECTOMY Bilateral 10/15/2015   Procedure: LAPAROSCOPIC ASSISTED VAGINAL HYSTERECTOMY WITH SALPINGECTOMY;  Surgeon: Eldred Manges, MD;  Location: [facility];  Service: Gynecology;  Laterality: Bilateral;  3 hours     OB History   No obstetric history on file.      Home Medications    Prior to Admission medications   Medication Sig Start Date End Date Taking? Authorizing Provider  butalbital-acetaminophen-caffeine (FIORICET, ESGIC) 50-325-40 MG tablet Take 1 tablet by mouth every 6 (six) hours as needed for headache. Patient not taking: Reported on 12/02/2018 06/07/18 06/07/19  Alycia Rossetti, MD  clotrimazole-betamethasone (LOTRISONE) cream APPLY TO AFFECTED AREA(S) TWO TIMES A DAY AS DIRECTED 06/07/18   Alycia Rossetti, MD  diphenhydrAMINE (BENADRYL) 25 MG tablet Take 1 tablet (25 mg total) by mouth once for 1 dose. 12/03/18 12/03/18  Ritchie Klee S, PA-C  escitalopram (LEXAPRO) 10 MG tablet TAKE ONE TABLET BY MOUTH AT BEDTIME 06/10/18   [place], Modena Nunnery, MD  HYDROcodone-acetaminophen [facility]) 5-325 MG tablet Take 1 tablet by mouth every 6 (six) hours as needed for moderate pain. 12/02/18   Alycia Rossetti, MD  ibuprofen (IBU) 800 MG tablet Take 1 tablet (800 mg total) by mouth every 8 (eight) hours as needed. 06/07/18   Alycia Rossetti, MD  meclizine (ANTIVERT) 25 MG tablet Take 1 tablet (25 mg total) by mouth 3 (three) times daily as needed for dizziness. Patient not taking: Reported on 12/02/2018 08/15/18   Susy Frizzle, MD  metoCLOPramide [facility])  10 MG tablet Take 1 tablet (10 mg total) by mouth every 6 (six) hours for 1 dose. 12/03/18 12/04/18  Nayden Czajka S, PA-C  promethazine (PHENERGAN) 12.5 MG tablet Take 1 tablet (12.5 mg total) by mouth every 8 (eight) hours as needed for nausea or vomiting. 12/02/18   Alycia Rossetti, MD  topiramate (TOPAMAX) 50 MG tablet Take 50 mg by mouth 2 (two) times daily.  01/17/17   [provider]  traZODone (DESYREL) 50 MG tablet Take 50 mg by mouth at bedtime.     [provider]  Ubrogepant (UBRELVY) 100 MG TABS Take by mouth.    [provider]  valACYclovir (VALTREX) 500 MG tablet Take 1 tablet (500 mg total) by mouth daily. Patient not taking: Reported on 12/02/2018 06/07/18   Alycia Rossetti, MD    Family History Family History  Problem Relation Age of Onset  . Heart disease Mother   . Diabetes Mother   . Hypertension Mother   . Cancer Sister        esophogeal    Social History Social History   Tobacco Use  . Smoking status: Current Every Day Smoker    Packs/day: 0.15    Years: 30.00    Pack years: 4.50    Types: Cigarettes  . Smokeless tobacco: Never Used  Substance Use Topics  . Alcohol use: Yes    Comment: occas.  . Drug use: No     Allergies   Patient has no known allergies.   Review of Systems Review of Systems  Constitutional: Positive for chills. Negative for fever.  HENT: Negative for ear pain and sore throat.   Eyes: Positive for photophobia. Negative for visual disturbance.  Respiratory: Negative for cough and shortness of breath.   Cardiovascular: Negative for chest pain.  Gastrointestinal: Positive for diarrhea. Negative for abdominal pain, constipation, nausea and vomiting.  Genitourinary: Negative for dysuria and hematuria.  Musculoskeletal: Negative for back pain and neck pain.  Skin: Negative for rash.  Neurological: Positive for headaches. Negative for dizziness, weakness, light-headedness and numbness.  All other systems reviewed and are negative.   Physical Exam Updated Vital Signs BP 113/66 (BP Location: Left Arm)   Pulse (!) 103   Temp 99.5 F (37.5 C) (Oral)   Resp 16   Ht 5\' 2"  (1.575 m)   Wt 92.1 kg   LMP 09/07/2015 (Approximate)   SpO2 98%   BMI 37.13 kg/m   Physical Exam Vitals signs and nursing note reviewed.  Constitutional:      General: She is not in acute distress.    Appearance: She is well-developed.  HENT:     Head: Normocephalic and atraumatic.   Eyes:     Conjunctiva/sclera: Conjunctivae normal.  Neck:     Musculoskeletal: Neck supple.  Cardiovascular:     Rate and Rhythm: Normal rate and regular rhythm.     Heart sounds: No murmur.  Pulmonary:     Effort: Pulmonary effort is normal. No respiratory distress.     Breath sounds: Normal breath sounds.  Abdominal:     Palpations: Abdomen is soft.     Tenderness: There is no abdominal tenderness.  Skin:    General: Skin is warm and dry.  Neurological:     Mental Status: She is alert.     Comments: Mental Status:  Alert, thought content appropriate, able to give a coherent history. Speech fluent without evidence of aphasia. Able to follow 2 step commands without difficulty.  Cranial Nerves:  II:  pupils equal, round, reactive to light III,IV, VI: ptosis not present, extra-ocular motions intact bilaterally  V,VII: smile symmetric, facial light touch sensation equal VIII: hearing grossly normal to voice  X: uvula elevates symmetrically  XI: bilateral shoulder shrug symmetric and strong XII: midline tongue extension without fassiculations Motor:  Normal tone. 5/5 strength of BUE and BLE major muscle groups including strong and equal grip strength and dorsiflexion/plantar flexion Sensory: light touch normal in all extremities.      ED Treatments / Results  Labs (all labs ordered are listed, but only abnormal results are displayed) Labs Reviewed  COMPREHENSIVE METABOLIC PANEL - Abnormal; Notable for the following components:      Result Value   Creatinine, Ser 1.03 (*)    Calcium 8.7 (*)    AST 56 (*)    ALT 89 (*)    All other components within normal limits  NOVEL CORONAVIRUS, NAA (HOSP ORDER, SEND-OUT TO REF LAB; TAT 18-24 HRS)  CBC WITH DIFFERENTIAL/PLATELET    EKG None  Radiology No results found.  Procedures Procedures (including critical care time)  Medications Ordered in ED Medications  sodium chloride 0.9 % bolus 1,000 mL (0 mLs Intravenous Stopped  12/03/18 1516)  ketorolac (TORADOL) 30 MG/ML injection 30 mg (30 mg Intravenous Given 12/03/18 1404)  acetaminophen (TYLENOL) tablet 1,000 mg (1,000 mg Oral Given 12/03/18 1514)     Initial Impression / Assessment and Plan / ED Course  I have reviewed the triage vital signs and the nursing notes.  Pertinent labs & imaging results that were available during my care of the patient were reviewed by me and considered in my medical decision making (see chart for details).    Final Clinical Impressions(s) / ED Diagnoses   Final diagnoses:  Acute nonintractable headache, unspecified headache type  Diarrhea, unspecified type  Suspected Covid-19 Virus Infection  Abnormal liver enzymes   48 year old female presenting for evaluation of headache.  Has history of migraines.  Also complaining of chills and diarrhea.  No known coronavirus exposures.  Neuro exam is normal today.  Abdomen is soft and nontender.  She is afebrile here but marginally tachycardic initially.  Otherwise vital signs reassuring.  Obtained labs, no leukocytosis.  No gross electrolyte derangement.  Kidney function is normal.  Liver enzymes are slightly elevated.  She was given IV fluids, Toradol, Tylenol.  On reassessment she states her symptoms feel much improved but are not completely resolved.  I discussed the work-up.  Discussed plan to obtain coronavirus testing given her chills/headache, diarrhea and slightly elevated liver enzymes.  Advised to self quarantine while awaiting results.    ED Discharge Orders         Ordered    metoCLOPramide (REGLAN) 10 MG tablet  Every 6 hours     12/03/18 1701    diphenhydrAMINE (BENADRYL) 25 MG tablet   Once     12/03/18 [place], Ashtyn Meland S, PA-C 12/03/18 2306    Sherwood Gambler, MD 12/06/18 [phone]

## 2018-12-04 LAB — NOVEL CORONAVIRUS, NAA (HOSP ORDER, SEND-OUT TO REF LAB; TAT 18-24 HRS): SARS-CoV-2, NAA: NOT DETECTED

## 2018-12-25 ENCOUNTER — Encounter: Payer: 59 | Admitting: Family Medicine

## 2019-01-19 ENCOUNTER — Emergency Department (HOSPITAL_BASED_OUTPATIENT_CLINIC_OR_DEPARTMENT_OTHER): Payer: 59

## 2019-01-19 ENCOUNTER — Other Ambulatory Visit: Payer: Self-pay

## 2019-01-19 ENCOUNTER — Encounter (HOSPITAL_BASED_OUTPATIENT_CLINIC_OR_DEPARTMENT_OTHER): Payer: Self-pay | Admitting: Emergency Medicine

## 2019-01-19 ENCOUNTER — Emergency Department (HOSPITAL_BASED_OUTPATIENT_CLINIC_OR_DEPARTMENT_OTHER)
Admission: EM | Admit: 2019-01-19 | Discharge: 2019-01-19 | Disposition: A | Discharge status: Home / Self Care | Payer: 59 | Attending: Emergency Medicine | Admitting: Emergency Medicine

## 2019-01-19 DIAGNOSIS — Z79899 Other long term (current) drug therapy: Secondary | ICD-10-CM | POA: Diagnosis not present

## 2019-01-19 DIAGNOSIS — F17210 Nicotine dependence, cigarettes, uncomplicated: Secondary | ICD-10-CM | POA: Diagnosis not present

## 2019-01-19 DIAGNOSIS — E876 Hypokalemia: Secondary | ICD-10-CM

## 2019-01-19 DIAGNOSIS — I824Z2 Acute embolism and thrombosis of unspecified deep veins of left distal lower extremity: Secondary | ICD-10-CM | POA: Insufficient documentation

## 2019-01-19 DIAGNOSIS — M79662 Pain in left lower leg: Secondary | ICD-10-CM | POA: Diagnosis present

## 2019-01-19 LAB — COMPREHENSIVE METABOLIC PANEL
ALT: 19 U/L (ref 0–44)
AST: 14 U/L — ABNORMAL LOW (ref 15–41)
Albumin: 3.8 g/dL (ref 3.5–5.0)
Alkaline Phosphatase: 84 U/L (ref 38–126)
Anion gap: 8 (ref 5–15)
BUN: 11 mg/dL (ref 6–20)
CO2: 22 mmol/L (ref 22–32)
Calcium: 8.9 mg/dL (ref 8.9–10.3)
Chloride: 105 mmol/L (ref 98–111)
Creatinine, Ser: 0.88 mg/dL (ref 0.44–1.00)
GFR calc Af Amer: >60 mL/min (ref >60)
GFR calc non Af Amer: >60 mL/min (ref >60)
Glucose, Bld: 105 mg/dL — ABNORMAL HIGH (ref 70–99)
Potassium: 3 mmol/L — ABNORMAL LOW (ref 3.5–5.1)
Sodium: 135 mmol/L (ref 135–145)
Total Bilirubin: 0.8 mg/dL (ref 0.3–1.2)
Total Protein: 7.7 g/dL (ref 6.5–8.1)

## 2019-01-19 LAB — CBC WITH DIFFERENTIAL/PLATELET
Abs Immature Granulocytes: 0.01 10*3/uL (ref 0.00–0.07)
Basophils Absolute: 0.1 10*3/uL (ref 0.0–0.1)
Basophils Relative: 1 %
Eosinophils Absolute: 0.4 10*3/uL (ref 0.0–0.5)
Eosinophils Relative: 5 %
HCT: 41 % (ref 36.0–46.0)
Hemoglobin: 13.2 g/dL (ref 12.0–15.0)
Immature Granulocytes: 0 %
Lymphocytes Relative: 33 %
Lymphs Abs: 2.5 10*3/uL (ref 0.7–4.0)
MCH: 28.1 pg (ref 26.0–34.0)
MCHC: 32.2 g/dL (ref 30.0–36.0)
MCV: 87.4 fL (ref 80.0–100.0)
Monocytes Absolute: 0.6 10*3/uL (ref 0.1–1.0)
Monocytes Relative: 8 %
Neutro Abs: 3.9 10*3/uL (ref 1.7–7.7)
Neutrophils Relative %: 53 %
Platelets: 211 10*3/uL (ref 150–400)
RBC: 4.69 MIL/uL (ref 3.87–5.11)
RDW: 13.1 % (ref 11.5–15.5)
WBC: 7.5 10*3/uL (ref 4.0–10.5)
nRBC: 0 % (ref 0.0–0.2)

## 2019-01-19 LAB — PROTIME-INR
INR: 1 (ref 0.8–1.2)
Prothrombin Time: 13.5 seconds (ref 11.4–15.2)

## 2019-01-19 MED ORDER — HYDROCODONE-ACETAMINOPHEN 5-325 MG PO TABS: 1.0000 | ORAL_TABLET | Freq: Four times a day (QID) | ORAL | 0 refills | Status: DC | PRN

## 2019-01-19 MED ORDER — RIVAROXABAN (XARELTO) EDUCATION KIT FOR DVT/PE PATIENTS
PACK | Freq: Once | Status: AC
  Administered 2019-01-19: 18:00:00

## 2019-01-19 MED ORDER — POTASSIUM CHLORIDE CRYS ER 20 MEQ PO TBCR
20.0000 meq | EXTENDED_RELEASE_TABLET | Freq: Once | ORAL | Status: AC
  Administered 2019-01-19: 20 meq via ORAL
  Filled 2019-01-19: qty 1

## 2019-01-19 MED ORDER — RIVAROXABAN 15 MG PO TABS
15.0000 mg | ORAL_TABLET | Freq: Once | ORAL | Status: AC
  Administered 2019-01-19: 15 mg via ORAL
  Filled 2019-01-19: qty 1

## 2019-01-19 MED ORDER — RIVAROXABAN (XARELTO) VTE STARTER PACK (15 & 20 MG): ORAL_TABLET | ORAL | 0 refills | Status: DC

## 2019-01-19 NOTE — ED Notes (Signed)
Pt to u/s

## 2019-01-19 NOTE — ED Provider Notes (Signed)
[facility] Provider Note   CSN: [id number] Arrival date & time: 01/19/19  1256     History   Chief Complaint Chief Complaint  Patient presents with   Leg Pain    HPI Susan King is a 48 y.o. female.     HPI   Susan King is a 48 y.o. female, with a history of migraines and obesity, presenting to the ED with left leg pain beginning 2 days ago. States she woke up with pain to left calf, aching, worsening, now moderate to severe, radiating toward the ankle.  She has not had this issue before.  Since onset she began to notice increased warmth, swelling, and increasing pain. Pain does seem to be better with ambulation, however. No history of PE/DVT, recent surgery, recent trauma, recent immobilization. Denies fever/chills, numbness, weakness, trauma/falls, knee/hip pain, chest pain, shortness of breath, dizziness, back pain, or any other complaints.   Past Medical History:  Diagnosis Date   Headache    Migraines   HSV-2 (herpes simplex virus 2) infection    Stroke ([place]) 2008   slight, no residual    Patient Active Problem List   Diagnosis Date Noted   GAD (generalized anxiety disorder) 01/26/2017   Migraines 07/18/2016   Major depression, recurrent ([place]) 07/18/2016   Fibroids, subserous 10/15/2015   Uterus, adenomyosis 10/14/2015   Herpes simplex type 2 infection 06/15/2015   Obesity 06/17/2013   Tobacco use disorder 06/17/2013   History of CVA in adulthood 03/20/2006    Past Surgical History:  Procedure Laterality Date   APPENDECTOMY     LAPAROSCOPIC VAGINAL HYSTERECTOMY WITH SALPINGECTOMY Bilateral 10/15/2015   Procedure: LAPAROSCOPIC ASSISTED VAGINAL HYSTERECTOMY WITH SALPINGECTOMY;  Surgeon: Eldred Manges, MD;  Location: [facility];  Service: Gynecology;  Laterality: Bilateral;  3 hours     OB History   No obstetric history on file.      Home Medications    Prior to Admission medications   Medication Sig  Start Date End Date Taking? Authorizing Provider  clotrimazole-betamethasone (LOTRISONE) cream APPLY TO AFFECTED AREA(S) TWO TIMES A DAY AS DIRECTED 06/07/18   Alycia Rossetti, MD  diphenhydrAMINE (BENADRYL) 25 MG tablet Take 1 tablet (25 mg total) by mouth once for 1 dose. 12/03/18 12/03/18  [place]out[place], Cortni S, PA-C  escitalopram (LEXAPRO) 10 MG tablet TAKE ONE TABLET BY MOUTH AT BEDTIME 06/10/18   [place], Modena Nunnery, MD  HYDROcodone-acetaminophen (NORCO/VICODIN) 5-325 MG tablet Take 1-2 tablets by mouth every 6 (six) hours as needed for severe pain. 01/19/19   Azalee Weimer C, PA-C  meclizine (ANTIVERT) 25 MG tablet Take 1 tablet (25 mg total) by mouth 3 (three) times daily as needed for dizziness. Patient not taking: Reported on 12/02/2018 08/15/18   Susy Frizzle, MD  metoCLOPramide (REGLAN) 10 MG tablet Take 1 tablet (10 mg total) by mouth every 6 (six) hours for 1 dose. 12/03/18 12/04/18  Cout[place], Cortni S, PA-C  promethazine (PHENERGAN) 12.5 MG tablet Take 1 tablet (12.5 mg total) by mouth every 8 (eight) hours as needed for nausea or vomiting. 12/02/18   Alycia Rossetti, MD  Rivaroxaban 15 & 20 MG TBPK Follow package directions: Take one 90m tablet by mouth twice a day. On day 22, switch to one 223mtablet once a day. Take with food. 01/19/19   Tisha Cline C, PA-C  topiramate (TOPAMAX) 50 MG tablet Take 50 mg by mouth 2 (two) times daily.  01/17/17   [provider]  traZODone (DESYREL) 50 MG tablet Take 50 mg by mouth at bedtime.    [provider]  Ubrogepant (UBRELVY) 100 MG TABS Take 100 mg by mouth as needed (For resolution of migraines).     [provider]  valACYclovir (VALTREX) 500 MG tablet Take 1 tablet (500 mg total) by mouth daily. Patient not taking: Reported on 12/02/2018 06/07/18   Alycia Rossetti, MD    Family History Family History  Problem Relation Age of Onset   Heart disease Mother    Diabetes Mother    Hypertension Mother    Cancer Sister          esophogeal    Social History Social History   Tobacco Use   Smoking status: Current Every Day Smoker    Packs/day: 0.15    Years: 30.00    Pack years: 4.50    Types: Cigarettes   Smokeless tobacco: Never Used  Substance Use Topics   Alcohol use: Yes    Comment: occas.   Drug use: No     Allergies   Patient has no known allergies.   Review of Systems Review of Systems  Constitutional: Negative for chills, diaphoresis and fever.  Respiratory: Negative for cough and shortness of breath.   Cardiovascular: Positive for leg swelling. Negative for chest pain and palpitations.  Gastrointestinal: Negative for abdominal pain, diarrhea, nausea and vomiting.  Musculoskeletal: Positive for myalgias. Negative for back pain.  Skin: Negative for wound.  Neurological: Negative for weakness and numbness.  All other systems reviewed and are negative.    Physical Exam Updated Vital Signs BP 133/74 (BP Location: Left Arm)    Pulse (!) 116    Temp 98.8 F (37.1 C) (Oral)    Resp 18    Ht 5' 2"  (1.575 m)    Wt 89.8 kg    LMP 09/07/2015 (Approximate)    SpO2 100%    BMI 36.21 kg/m   Physical Exam Vitals signs and nursing note reviewed.  Constitutional:      General: She is not in acute distress.    Appearance: She is well-developed. She is not diaphoretic.  HENT:     Head: Normocephalic and atraumatic.     Mouth/Throat:     Mouth: Mucous membranes are moist.     Pharynx: Oropharynx is clear.  Eyes:     Conjunctiva/sclera: Conjunctivae normal.  Neck:     Musculoskeletal: Neck supple.  Cardiovascular:     Rate and Rhythm: Normal rate and regular rhythm.     Pulses: Normal pulses.          Radial pulses are 2+ on the right side and 2+ on the left side.       Dorsalis pedis pulses are 2+ on the right side and 2+ on the left side.       Posterior tibial pulses are 2+ on the right side and 2+ on the left side.     Heart sounds: Normal heart sounds.     Comments: Tactile  temperature in the extremities appropriate and equal bilaterally. Pulmonary:     Effort: Pulmonary effort is normal. No respiratory distress.     Breath sounds: Normal breath sounds.  Abdominal:     Palpations: Abdomen is soft.     Tenderness: There is no abdominal tenderness. There is no guarding.  Musculoskeletal:     Right lower leg: No edema.     Left lower leg: No edema.     Comments: Tenderness to  the left calf.  I did not note any swelling, erythema, or increased warmth to the calf, however, she does have erythema, edema, and increased warmth around the ankle.   No pain with range of motion of the ankle.  No noted wounds. No swelling, pain, tenderness, color change, or increased warmth in the left knee.  Lymphadenopathy:     Cervical: No cervical adenopathy.  Skin:    General: Skin is warm and dry.  Neurological:     Mental Status: She is alert.     Comments: Sensation to light touch grossly intact in the bilateral lower extremities. Strength 5/5 in the bilateral knees and ankles.  Psychiatric:        Mood and Affect: Mood and affect normal.        Speech: Speech normal.        Behavior: Behavior normal.      ED Treatments / Results  Labs (all labs ordered are listed, but only abnormal results are displayed) Labs Reviewed  COMPREHENSIVE METABOLIC PANEL - Abnormal; Notable for the following components:      Result Value   Potassium 3.0 (*)    Glucose, Bld 105 (*)    AST 14 (*)    All other components within normal limits  CBC WITH DIFFERENTIAL/PLATELET  PROTIME-INR    EKG None  Radiology US Venous Img Lower  Left (dvt Study)  Result Date: 01/19/2019 CLINICAL DATA:  Left calf pain and swelling. EXAM: LEFT LOWER EXTREMITY VENOUS DOPPLER ULTRASOUND TECHNIQUE: Gray-scale sonography with graded compression, as well as color Doppler and duplex ultrasound were performed to evaluate the lower extremity deep venous systems from the level of the common femoral vein and  including the common femoral, femoral, profunda femoral, popliteal and calf veins including the posterior tibial, peroneal and gastrocnemius veins when visible. The superficial great saphenous vein was also interrogated. Spectral Doppler was utilized to evaluate flow at rest and with distal augmentation maneuvers in the common femoral, femoral and popliteal veins. COMPARISON:  None. FINDINGS: Contralateral Common Femoral Vein: Respiratory phasicity is normal and symmetric with the symptomatic side. No evidence of thrombus. Normal compressibility. Common Femoral Vein: No evidence of thrombus. Normal compressibility, respiratory phasicity and response to augmentation. Saphenofemoral Junction: No evidence of thrombus. Normal compressibility and flow on color Doppler imaging. Profunda Femoral Vein: No evidence of thrombus. Normal compressibility and flow on color Doppler imaging. Femoral Vein: No evidence of thrombus. Normal compressibility, respiratory phasicity and response to augmentation. Popliteal Vein: Partial thrombus evident popliteal vein, nonocclusive. Calf Veins: Thrombus noted in the posterior tibial vein and partially in the peroneal vein. There are paired peroneal veins, 1 occluded with thrombus. Both posterior tibial veins are occluded with thrombus. Superficial Great Saphenous Vein: No evidence of thrombus. Normal compressibility. Venous Reflux:  None. Other Findings:  None. IMPRESSION: 1. Deep venous thrombosis noted extending from the popliteal vein into the posterior tibial and peroneal veins. 2. No evidence of thrombus in the common femoral vein, deep femoral vein or femoral vein. Electronically Signed   By: Lajean Manes M.D.   On: 01/19/2019 16:44    Procedures Procedures (including critical care time)  Medications Ordered in ED Medications  rivaroxaban [place] Reasons) Education Kit for DVT/PE patients ( Does not apply Given 01/19/19 1735)  Rivaroxaban (XARELTO) tablet 15 mg (15 mg Oral Given  01/19/19 1811)  potassium chloride SA (KLOR-CON) CR tablet 20 mEq (20 mEq Oral Given 01/19/19 1811)     Initial Impression / Assessment and Plan /  ED Course  I have reviewed the triage vital signs and the nursing notes.  Pertinent labs & imaging results that were available during my care of the patient were reviewed by me and considered in my medical decision making (see chart for details).  Clinical Course as of Jan 19 2312  Sun Jan 19, 2019  1520 Pulse 92 during my exam.  Pulse Rate(!): 116 [SJ]    Clinical Course User Index [SJ] Pookela Sellin C, PA-C       Patient presents with left leg pain for the last few days. Patient is nontoxic appearing, afebrile, not tachycardic on my exam, not tachypneic, not hypotensive, maintains excellent SPO2 on room air, and is in no apparent distress.  DVT found distal to the popliteal on ultrasound. Mild hypokalemia noted.  Strategies for management discussed.  PCP to retest.  Lab work otherwise reassuring. Patient started on Xarelto with first dose given here in the ED.  Starter pack for Xarelto was prescribed. Message was sent to patient's PCP, Dr. Buelah Manis, to inform her of the situation and secure proper follow-up for the patient. The patient was given instructions for home care as well as return precautions. Patient voices understanding of these instructions, accepts the plan, and is comfortable with discharge.  Findings and plan of care discussed with Varney Biles, MD.   Final Clinical Impressions(s) / ED Diagnoses   Final diagnoses:  Acute deep vein thrombosis (DVT) of distal vein of left lower extremity ([place])  Hypokalemia    ED Discharge Orders         Ordered    Rivaroxaban 15 & 20 MG TBPK     01/19/19 1715    HYDROcodone-acetaminophen (NORCO/VICODIN) 5-325 MG tablet  Every 6 hours PRN     01/19/19 1916           Lorayne Bender, PA-C 01/19/19 2317    Varney Biles, MD 01/20/19 0041

## 2019-01-19 NOTE — ED Triage Notes (Signed)
Pt reports left calf pain,with redness, warmth and swelling X 2 days. Pt denies sob, denies injury. Pt ambulatory, reports pain improves with ambulation

## 2019-01-19 NOTE — Discharge Instructions (Addendum)
There was evidence of a deep venous thrombosis (DVT) noted on the ultrasound today.  This is a blood clot in the veins of the lower leg. We will start you on a blood thinner called rivaroxaban (brand name: Xarelto).  This is meant to reduce risk for further clot.  It is very important that you take this medication, as prescribed. Once you have a blood clot, it puts you at risk for further blood clots.  The blood thinner helps lower this risk.  Acetaminophen: May take acetaminophen (generic for Tylenol), as needed, for pain. Your daily total maximum amount of acetaminophen from all sources should be limited to 4000mg /day for persons without liver problems, or 2000mg /day for those with liver problems. Vicodin: May take Vicodin (hydrocodone-acetaminophen) as needed for severe pain.   Do not drive or perform other dangerous activities while taking this medication as it can cause drowsiness as well as changes in reaction time and judgement.   Please note that each pill of Vicodin contains 325 mg of acetaminophen (generic for Tylenol) and the above dosage limits apply.  You will need to follow-up with your primary care provider on this matter for continued evaluation and management. Typically, patients will need assessment to try to understand why they sustained a blood clot.  This further assessment can be done at a primary care provider or hematology/oncology (blood and clotting specialist).  Regardless, further assessment should be initiated.  Anticoagulant medications, such as Xarelto, can increase bleeding risk.  This needs to be taken into account with any traumatic injury, such as car accident, falls, assaults, etc. Typically, when someone is on a blood thinning medication, they will need evaluation and closer follow-up to assure they do not have signs of severe internal bleeding.  Return to the emergency department for spreading extremity pain, abdominal pain, loss of function in the extremities,  facial droop, chest pain, shortness of breath, passing out, dizziness, or any other major concerns.  Additionally, it was noted that your potassium was lower than normal.  This should be reevaluated by your primary care provider.

## 2019-01-19 NOTE — ED Notes (Signed)
Discharge instructions reviewed at length.  Verbalized understanding.

## 2019-01-22 ENCOUNTER — Emergency Department (HOSPITAL_COMMUNITY): Payer: 59

## 2019-01-22 ENCOUNTER — Other Ambulatory Visit: Payer: Self-pay

## 2019-01-22 ENCOUNTER — Emergency Department (HOSPITAL_COMMUNITY)
Admission: EM | Admit: 2019-01-22 | Discharge: 2019-01-22 | Disposition: A | Discharge status: Home / Self Care | Payer: 59 | Attending: Emergency Medicine | Admitting: Emergency Medicine

## 2019-01-22 DIAGNOSIS — Z86718 Personal history of other venous thrombosis and embolism: Secondary | ICD-10-CM | POA: Diagnosis not present

## 2019-01-22 DIAGNOSIS — Z79899 Other long term (current) drug therapy: Secondary | ICD-10-CM | POA: Diagnosis not present

## 2019-01-22 DIAGNOSIS — F17210 Nicotine dependence, cigarettes, uncomplicated: Secondary | ICD-10-CM | POA: Diagnosis not present

## 2019-01-22 DIAGNOSIS — R0789 Other chest pain: Secondary | ICD-10-CM | POA: Insufficient documentation

## 2019-01-22 DIAGNOSIS — Z7901 Long term (current) use of anticoagulants: Secondary | ICD-10-CM | POA: Insufficient documentation

## 2019-01-22 DIAGNOSIS — R079 Chest pain, unspecified: Secondary | ICD-10-CM

## 2019-01-22 LAB — CBC
HCT: 40.9 % (ref 36.0–46.0)
Hemoglobin: 12.9 g/dL (ref 12.0–15.0)
MCH: 28 pg (ref 26.0–34.0)
MCHC: 31.5 g/dL (ref 30.0–36.0)
MCV: 88.9 fL (ref 80.0–100.0)
Platelets: 244 10*3/uL (ref 150–400)
RBC: 4.6 MIL/uL (ref 3.87–5.11)
RDW: 12.9 % (ref 11.5–15.5)
WBC: 4.4 10*3/uL (ref 4.0–10.5)
nRBC: 0 % (ref 0.0–0.2)

## 2019-01-22 LAB — BASIC METABOLIC PANEL
Anion gap: 10 (ref 5–15)
BUN: 11 mg/dL (ref 6–20)
CO2: 20 mmol/L — ABNORMAL LOW (ref 22–32)
Calcium: 8.9 mg/dL (ref 8.9–10.3)
Chloride: 109 mmol/L (ref 98–111)
Creatinine, Ser: 0.93 mg/dL (ref 0.44–1.00)
GFR calc Af Amer: >60 mL/min (ref >60)
GFR calc non Af Amer: >60 mL/min (ref >60)
Glucose, Bld: 127 mg/dL — ABNORMAL HIGH (ref 70–99)
Potassium: 3.7 mmol/L (ref 3.5–5.1)
Sodium: 139 mmol/L (ref 135–145)

## 2019-01-22 LAB — I-STAT BETA HCG BLOOD, ED (MC, WL, AP ONLY): I-stat hCG, quantitative: <5 m[IU]/mL (ref <5)

## 2019-01-22 LAB — TROPONIN I (HIGH SENSITIVITY)
Troponin I (High Sensitivity): <2 ng/L (ref <18)
Troponin I (High Sensitivity): 3 ng/L (ref <18)

## 2019-01-22 MED ORDER — SODIUM CHLORIDE 0.9% FLUSH: 3.0000 mL | Freq: Once | INTRAVENOUS | Status: DC

## 2019-01-22 NOTE — ED Triage Notes (Signed)
Diagnosed with DVT LLE on Sunday.  C/o pressure to L chest and slight numbness to L arm since 7:15am.  Denies SOB.

## 2019-01-25 NOTE — ED Provider Notes (Signed)
[facility] EMERGENCY DEPARTMENT Provider Note   CSN: [id number] Arrival date & time: 01/22/19  0756     History   Chief Complaint No chief complaint on file.   HPI Susan King is a 48 y.o. female.     HPI  48 year old female with pain in her left chest.  Onset this morning while driving in her car.  Describes sharp pain left anterior chest.  Is been constant since onset.  No patient exacerbating relieving factors.  Recently diagnosed with DVT and started on Xarelto.  She reports compliance.  She is concerned about her chest pain given this recent diagnosis though.  Denies any shortness of breath.  Past Medical History:  Diagnosis Date  . Headache    Migraines  . HSV-2 (herpes simplex virus 2) infection   . Stroke [facility]) 2008   slight, no residual    Patient Active Problem List   Diagnosis Date Noted  . GAD (generalized anxiety disorder) 01/26/2017  . Migraines 07/18/2016  . Major depression, recurrent ([place]) 07/18/2016  . Fibroids, subserous 10/15/2015  . Uterus, adenomyosis 10/14/2015  . Herpes simplex type 2 infection 06/15/2015  . Obesity 06/17/2013  . Tobacco use disorder 06/17/2013  . History of CVA in adulthood 03/20/2006    Past Surgical History:  Procedure Laterality Date  . APPENDECTOMY    . LAPAROSCOPIC VAGINAL HYSTERECTOMY WITH SALPINGECTOMY Bilateral 10/15/2015   Procedure: LAPAROSCOPIC ASSISTED VAGINAL HYSTERECTOMY WITH SALPINGECTOMY;  Surgeon: Eldred Manges, MD;  Location: [facility];  Service: Gynecology;  Laterality: Bilateral;  3 hours     OB History   No obstetric history on file.      Home Medications    Prior to Admission medications   Medication Sig Start Date End Date Taking? Authorizing Provider  clotrimazole-betamethasone (LOTRISONE) cream APPLY TO AFFECTED AREA(S) TWO TIMES A DAY AS DIRECTED 06/07/18  Yes [place], Modena Nunnery, MD  escitalopram (LEXAPRO) 10 MG tablet TAKE ONE TABLET BY MOUTH AT BEDTIME 06/10/18  Yes  Addy, Modena Nunnery, MD  HYDROcodone-acetaminophen (NORCO/VICODIN) 5-325 MG tablet Take 1-2 tablets by mouth every 6 (six) hours as needed for severe pain. 01/19/19  Yes Joy, Shawn C, PA-C  Rivaroxaban 15 & 20 MG TBPK Follow package directions: Take one 15mg  tablet by mouth twice a day. On day 22, switch to one 20mg  tablet once a day. Take with food. 01/19/19  Yes Joy, Shawn C, PA-C  topiramate (TOPAMAX) 50 MG tablet Take 50 mg by mouth 2 (two) times daily.  01/17/17  Yes [provider]  traZODone (DESYREL) 50 MG tablet Take 50 mg by mouth at bedtime.   Yes [provider]  Ubrogepant (UBRELVY) 100 MG TABS Take 100 mg by mouth as needed (For resolution of migraines).    Yes [provider]  valACYclovir (VALTREX) 500 MG tablet Take 1 tablet (500 mg total) by mouth daily. 06/07/18  Yes , Modena Nunnery, MD  diphenhydrAMINE (BENADRYL) 25 MG tablet Take 1 tablet (25 mg total) by mouth once for 1 dose. 12/03/18 12/03/18  Couture, Cortni S, PA-C  meclizine (ANTIVERT) 25 MG tablet Take 1 tablet (25 mg total) by mouth 3 (three) times daily as needed for dizziness. Patient not taking: Reported on 12/02/2018 08/15/18   Susy Frizzle, MD  metoCLOPramide (REGLAN) 10 MG tablet Take 1 tablet (10 mg total) by mouth every 6 (six) hours for 1 dose. 12/03/18 12/04/18  Couture, Cortni S, PA-C  promethazine (PHENERGAN) 12.5 MG tablet Take 1 tablet (12.5  mg total) by mouth every 8 (eight) hours as needed for nausea or vomiting. Patient not taking: Reported on 01/22/2019 12/02/18   Alycia Rossetti, MD    Family History Family History  Problem Relation Age of Onset  . Heart disease Mother   . Diabetes Mother   . Hypertension Mother   . Cancer Sister        esophogeal    Social History Social History   Tobacco Use  . Smoking status: Current Every Day Smoker    Packs/day: 0.15    Years: 30.00    Pack years: 4.50    Types: Cigarettes  . Smokeless tobacco: Never Used  Substance Use  Topics  . Alcohol use: Yes    Comment: occas.  . Drug use: No     Allergies   Patient has no known allergies.   Review of Systems Review of Systems  All systems reviewed and negative, other than as noted in HPI.  Physical Exam Updated Vital Signs BP 128/72   Pulse 92   Temp 98.4 F (36.9 C) (Oral)   Resp 16   Ht 5\' 2"  (1.575 m)   Wt 89.4 kg   LMP 09/07/2015 (Approximate)   SpO2 100%   BMI 36.03 kg/m   Physical Exam Vitals signs and nursing note reviewed.  Constitutional:      General: She is not in acute distress.    Appearance: She is well-developed.  HENT:     Head: Normocephalic and atraumatic.  Eyes:     General:        Right eye: No discharge.        Left eye: No discharge.     Conjunctiva/sclera: Conjunctivae normal.  Neck:     Musculoskeletal: Neck supple.  Cardiovascular:     Rate and Rhythm: Normal rate and regular rhythm.     Heart sounds: Normal heart sounds. No murmur. No friction rub. No gallop.   Pulmonary:     Effort: Pulmonary effort is normal. No respiratory distress.     Breath sounds: Normal breath sounds.  Abdominal:     General: There is no distension.     Palpations: Abdomen is soft.     Tenderness: There is no abdominal tenderness.  Musculoskeletal:        General: No tenderness.  Skin:    General: Skin is warm and dry.  Neurological:     Mental Status: She is alert.  Psychiatric:        Behavior: Behavior normal.        Thought Content: Thought content normal.      ED Treatments / Results  Labs (all labs ordered are listed, but only abnormal results are displayed) Labs Reviewed  BASIC METABOLIC PANEL - Abnormal; Notable for the following components:      Result Value   CO2 20 (*)    Glucose, Bld 127 (*)    All other components within normal limits  CBC  I-STAT BETA HCG BLOOD, ED ([facility], [facility], [facility] ONLY)  TROPONIN I (HIGH SENSITIVITY)  TROPONIN I (HIGH SENSITIVITY)    EKG EKG Interpretation  Date/Time:  Wednesday  January 22 2019 13:33:26 EST Ventricular Rate:  73 PR Interval:  162 QRS Duration: 86 QT Interval:  376 QTC Calculation: 414 R Axis:   68 Text Interpretation: Normal sinus rhythm with sinus arrhythmia Normal ECG Confirmed by Virgel Manifold [phone]) on 01/22/2019 1:40:58 PM   Radiology No results found.  Procedures Procedures (including critical care time)  Medications Ordered in ED Medications - No data to display   Initial Impression / Assessment and Plan / ED Course  I have reviewed the triage vital signs and the nursing notes.  Pertinent labs & imaging results that were available during my care of the patient were reviewed by me and considered in my medical decision making (see chart for details).       48 year old female with chest pain.  Seems typical for ACS.  Consider PE with recent DVT diagnosis but she is being appropriately treated on Xarelto.  She has no specific respiratory complaints.  Oxygen saturations are normal on room air.  She is not tachycardic.  Even if this is secondary to PE, I do not feel that imaging or other further work-up would change management at this time..  Final Clinical Impressions(s) / ED Diagnoses   Final diagnoses:  Chest pain, unspecified type    ED Discharge Orders    None       Virgel Manifold, MD 01/25/19 1127

## 2019-01-29 ENCOUNTER — Ambulatory Visit: Payer: 59 | Admitting: Family Medicine

## 2019-01-29 ENCOUNTER — Encounter: Payer: Self-pay | Admitting: Family Medicine

## 2019-01-29 ENCOUNTER — Other Ambulatory Visit: Payer: Self-pay

## 2019-01-29 ENCOUNTER — Ambulatory Visit: Payer: 59 | Admitting: Orthopedic Surgery

## 2019-01-29 DIAGNOSIS — I82432 Acute embolism and thrombosis of left popliteal vein: Secondary | ICD-10-CM

## 2019-01-29 DIAGNOSIS — I82409 Acute embolism and thrombosis of unspecified deep veins of unspecified lower extremity: Secondary | ICD-10-CM | POA: Insufficient documentation

## 2019-01-29 MED ORDER — RIVAROXABAN 20 MG PO TABS: 20.0000 mg | ORAL_TABLET | Freq: Every day | ORAL | 3 refills | Status: DC

## 2019-01-29 NOTE — Assessment & Plan Note (Signed)
DVT unprovoked.  No known family history of any blood clotting disorders.  I will have her evaluated by hematology in the meantime she will complete her starter pack and then transition to 20 mg once a day for at least 3 months and I will await recommendations by hematology otherwise.  Discussed side effects of the medication.  She can use acetaminophen during the day she will use her hydrocodone for severe pain.  She is also getting a compression sock.

## 2019-01-29 NOTE — Patient Instructions (Addendum)
Continue Xarelto starter pack , once you get to 20mg  once a day you will continue this F/U as previous

## 2019-01-29 NOTE — Progress Notes (Signed)
   Subjective:    Patient ID: Susan King, female    DOB: Sep 26, 1970, 48 y.o.   MRN: [id number]  Patient presents for Follow-up (Blood clot )  Presented to the emergency room on November 1 with left leg pain swelling and aching into the calf.  She was found to have DVT.  Unprovoked DVT thus far.  She was started on Xarelto, currently on 15mg  BID, day 10 of her starter pack.  She has not had any abnormal bleeding with the blood thinner.   She went back to ER on 11/4  Due to chest pain, CXR done which was negative , EKG was unremarkable.  Labs are unremarkable she was not tachycardic she had normal oxygenation.  No further work-up was done in the emergency room   No recent travel, no new meds, no hormones , no family history of blood clots/ disorders    Overall she feels well.  She is not had any shortness of breath or recurrent chest pain.  She does not have any new concerns today.  Swelling in some discomfort in her calf        Review Of Systems:  GEN- denies fatigue, fever, weight loss,weakness, recent illness HEENT- denies eye drainage, change in vision, nasal discharge, CVS- denies chest pain, palpitations RESP- denies SOB, cough, wheeze ABD- denies N/V, change in stools, abd pain GU- denies dysuria, hematuria, dribbling, incontinence MSK- denies joint pain, muscle aches, injury Neuro- denies headache, dizziness, syncope, seizure activity       Objective:    BP 118/64   Pulse 94   Temp 97.9 F (36.6 C) (Other (Comment))   Resp 16   Ht 5' 2.5" (1.588 m)   Wt 198 lb (89.8 kg)   LMP 09/07/2015 (Approximate)   SpO2 99%   BMI 35.64 kg/m  GEN- NAD, alert and oriented x3 HEENT- PERRL, EOMI, non injected sclera, pink conjunctiva, MMM, oropharynx clear CVS- RRR, no murmur RESP-CTAB ABD-NABS,soft,NT,ND EXT-left lower extremity mild tenderness to palpation posterior calf with swelling down to the ankle Pulses- Radial, DP- 2+        Assessment & Plan:      Problem  List Items Addressed This Visit      Unprioritized   DVT (deep venous thrombosis) (HCC)    DVT unprovoked.  No known family history of any blood clotting disorders.  I will have her evaluated by hematology in the meantime she will complete her starter pack and then transition to 20 mg once a day for at least 3 months and I will await recommendations by hematology otherwise.  Discussed side effects of the medication.  She can use acetaminophen during the day she will use her hydrocodone for severe pain.  She is also getting a compression sock.      Relevant Medications   rivaroxaban (XARELTO) 20 MG TABS tablet   Other Relevant Orders   Ambulatory referral to Hematology      Note: This dictation was prepared with Dragon dictation along with smaller phrase technology. Any transcriptional errors that result from this process are unintentional.

## 2019-01-31 ENCOUNTER — Telehealth: Payer: Self-pay | Admitting: Hematology and Oncology

## 2019-01-31 NOTE — Telephone Encounter (Signed)
A new hem appt has been scheduled for Susan King to see Dr. Lorenso Courier on 12/4 at 2pm. She's been cld and made aware to arrive 15 minutes early.

## 2019-02-20 ENCOUNTER — Other Ambulatory Visit: Payer: Self-pay

## 2019-02-20 ENCOUNTER — Telehealth: Payer: Self-pay | Admitting: Hematology and Oncology

## 2019-02-20 DIAGNOSIS — Z20822 Contact with and (suspected) exposure to covid-19: Secondary | ICD-10-CM

## 2019-02-20 NOTE — Telephone Encounter (Signed)
I received a msg to reschedule Susan King's new hem appt scheduled w/Dr. Lorenso Courier on 12/4. I was unable to reach the pt because her vm is full. I notified the referring office to have them contact the pt to call and reschedule.

## 2019-02-21 ENCOUNTER — Inpatient Hospital Stay: Payer: 59 | Admitting: Hematology and Oncology

## 2019-02-21 ENCOUNTER — Inpatient Hospital Stay: Payer: 59

## 2019-02-24 LAB — NOVEL CORONAVIRUS, NAA: SARS-CoV-2, NAA: NOT DETECTED

## 2019-03-05 ENCOUNTER — Encounter: Payer: Self-pay | Admitting: Family Medicine

## 2019-03-25 ENCOUNTER — Ambulatory Visit: Payer: 59 | Admitting: Family Medicine

## 2019-03-25 ENCOUNTER — Encounter: Payer: Self-pay | Admitting: Family Medicine

## 2019-03-25 ENCOUNTER — Other Ambulatory Visit: Payer: Self-pay

## 2019-03-25 VITALS — BP 130/84 | HR 78 | Temp 97.8°F | Resp 14 | Ht 62.5 in | Wt 200.0 lb

## 2019-03-25 DIAGNOSIS — R0789 Other chest pain: Secondary | ICD-10-CM | POA: Diagnosis not present

## 2019-03-25 MED ORDER — PREDNISONE 20 MG PO TABS: ORAL_TABLET | ORAL | 0 refills | Status: DC

## 2019-03-25 NOTE — Progress Notes (Signed)
Subjective:     Patient ID: Susan King, female   DOB: 11-05-1970, 49 y.o.   MRN: [id number]  HPI Patient was diagnosed with a DVT unprovoked in November.  She is currently on Xarelto 20 mg a day.  She has not had any CT scan to evaluate for pulmonary embolism.  She was seen in the emergency room in November with chest pain and initial work-up in the emergency room was negative.  This included a chest x-ray that was normal.  She presents today complaining of chest pain.  It began this morning.  Pain is located right over the xiphoid process.  She states that there is no alleviating factor.  She rates it as a 4 or 5 on a scale of 1-10.  The pain is sharp.  Laying down on the exam table causes the pain to worsen.  Pressure recreates the pain and exacerbates it right at the costochondral junction on the lower left side of the sternum.  She is extremely tender to palpation in that area and jumps when I press gently in that area.  There is no erythema or rash or swelling in that area.  She denies any trauma to that area.  She states that the emergency room doctor had recommended that she have a stress test and she would like to schedule that now however her chest pain does not sound cardiac in nature.  It sounds musculoskeletal and possibly costochondritis.  Although she has a history of a DVT in the left leg, she has been on Xarelto now for almost 2 months.  She denies any pleurisy.  She denies any hemoptysis.  She denies any cough.  She denies any shortness of breath with activity.  There is no shortness of breath with the chest pain at all.  It is simply a sharp chest pain that is worse with twisting and lying movement as well as with palpation. Past Medical History:  Diagnosis Date  . Headache    Migraines  . HSV-2 (herpes simplex virus 2) infection   . Stroke [facility]) 2008   slight, no residual   Past Surgical History:  Procedure Laterality Date  . APPENDECTOMY    . LAPAROSCOPIC VAGINAL HYSTERECTOMY  WITH SALPINGECTOMY Bilateral 10/15/2015   Procedure: LAPAROSCOPIC ASSISTED VAGINAL HYSTERECTOMY WITH SALPINGECTOMY;  Surgeon: Eldred Manges, MD;  Location: [facility];  Service: Gynecology;  Laterality: Bilateral;  3 hours   Current Outpatient Medications on File Prior to Visit  Medication Sig Dispense Refill  . escitalopram (LEXAPRO) 10 MG tablet TAKE ONE TABLET BY MOUTH AT BEDTIME 30 tablet 1  . rivaroxaban (XARELTO) 20 MG TABS tablet Take 1 tablet (20 mg total) by mouth daily with supper. 30 tablet 3  . topiramate (TOPAMAX) 50 MG tablet Take 50 mg by mouth 2 (two) times daily.   0  . traZODone (DESYREL) 50 MG tablet Take 50 mg by mouth at bedtime.    Marland Kitchen Ubrogepant (UBRELVY) 100 MG TABS Take 100 mg by mouth as needed (For resolution of migraines).     . valACYclovir (VALTREX) 500 MG tablet Take 1 tablet (500 mg total) by mouth daily. 30 tablet 2  . diphenhydrAMINE (BENADRYL) 25 MG tablet Take 1 tablet (25 mg total) by mouth once for 1 dose. 1 tablet 0  . HYDROcodone-acetaminophen (NORCO/VICODIN) 5-325 MG tablet Take 1-2 tablets by mouth every 6 (six) hours as needed for severe pain. (Patient not taking: Reported on 03/25/2019) 10 tablet 0   No current facility-administered medications  on file prior to visit.   No Known Allergies Social History   Socioeconomic History  . Marital status: Single    Spouse name: Not on file  . Number of children: Not on file  . Years of education: Not on file  . Highest education level: Not on file  Occupational History  . Not on file  Tobacco Use  . Smoking status: Former Smoker    Packs/day: 0.15    Years: 30.00    Pack years: 4.50    Types: Cigarettes    Quit date: 01/23/2019    Years since quitting: 0.1  . Smokeless tobacco: Never Used  Substance and Sexual Activity  . Alcohol use: Yes    Comment: occas.  . Drug use: No  . Sexual activity: Not on file  Other Topics Concern  . Not on file  Social History Narrative  . Not on file   Social  Determinants of Health   Financial Resource Strain:   . Difficulty of Paying Living Expenses: Not on file  Food Insecurity:   . Worried About Charity fundraiser in the Last Year: Not on file  . Ran Out of Food in the Last Year: Not on file  Transportation Needs:   . Lack of Transportation (Medical): Not on file  . Lack of Transportation (Non-Medical): Not on file  Physical Activity:   . Days of Exercise per Week: Not on file  . Minutes of Exercise per Session: Not on file  Stress:   . Feeling of Stress : Not on file  Social Connections:   . Frequency of Communication with Friends and Family: Not on file  . Frequency of Social Gatherings with Friends and Family: Not on file  . Attends Religious Services: Not on file  . Active Member of Clubs or Organizations: Not on file  . Attends Archivist Meetings: Not on file  . Marital Status: Not on file  Intimate Partner Violence:   . Fear of Current or Ex-Partner: Not on file  . Emotionally Abused: Not on file  . Physically Abused: Not on file  . Sexually Abused: Not on file     Review of Systems  All other systems reviewed and are negative.      Objective:   Physical Exam Vitals reviewed.  Constitutional:      Appearance: Normal appearance. She is not ill-appearing, toxic-appearing or diaphoretic.  HENT:     Head: Normocephalic and atraumatic.     Right Ear: Tympanic membrane and ear canal normal.     Left Ear: Tympanic membrane and ear canal normal.     Nose: Nose normal. No congestion or rhinorrhea.  Cardiovascular:     Rate and Rhythm: Normal rate and regular rhythm.     Heart sounds: Normal heart sounds. No murmur.  Pulmonary:     Effort: Pulmonary effort is normal. No respiratory distress.     Breath sounds: Normal breath sounds. No stridor. No wheezing, rhonchi or rales.  Chest:     Chest wall: Tenderness present. No deformity or swelling.    Abdominal:     General: Abdomen is flat. Bowel sounds are  normal.     Palpations: Abdomen is soft.  Musculoskeletal:     Right lower leg: No edema.     Left lower leg: No edema.  Neurological:     General: No focal deficit present.     Mental Status: She is alert and oriented to person, place, and time. Mental  status is at baseline.     Cranial Nerves: No cranial nerve deficit.     Sensory: No sensory deficit.     Motor: No weakness.     Coordination: Coordination normal.     Gait: Gait normal.        Assessment:     Other chest pain - Plan: EKG 12-Lead, D-dimer, quantitative       Plan:     Patient's chest pain is atypical.  It sounds more musculoskeletal and I suspect costochondritis.  Given the severity is a 5 on a scale of 1-10 I would suggest trying a prednisone taper pack as she is unable to take NSAIDs due to her anticoagulation.  I have a very low index of suspicion for this to be cardiac in nature however the patient does have a family history of a sister who died from congestive heart failure in her 75s although this appears to be due to viral myocarditis based on her description.  She also had a father who had strokes and heart disease in his 75s however this appears to be age-related and likely does not increase her genetic risk factors for premature cardiovascular disease.  Patient request a referral to cardiologist for a stress test as this is the second time she has had the pain and I believe this is reasonable.  I will check a D-dimer I suspect that it will be normal having completed 2 months of Xarelto.  If it is elevated, I would recommend a CT scan of the chest just to determine if she does not fact have a pulmonary embolism although I have a very low index of suspicion for this.  If she does not fact have a new pulmonary embolism while taking 2 months of Xarelto, I would definitely recommend a hypercoagulable work-up through hematology and also switch the patient to Lovenox.  However I feel this is highly unlikely particular  given the reproducible nature of the pain to palpation

## 2019-03-26 LAB — D-DIMER, QUANTITATIVE: D-Dimer, Quant: 0.19 mcg/mL FEU (ref <0.50)

## 2019-03-31 ENCOUNTER — Encounter: Payer: 59 | Admitting: Family Medicine

## 2019-04-07 ENCOUNTER — Encounter: Payer: Self-pay | Admitting: Family Medicine

## 2019-04-22 ENCOUNTER — Telehealth: Payer: Self-pay | Admitting: *Deleted

## 2019-04-22 ENCOUNTER — Other Ambulatory Visit: Payer: Self-pay

## 2019-04-22 ENCOUNTER — Ambulatory Visit (INDEPENDENT_AMBULATORY_CARE_PROVIDER_SITE_OTHER): Payer: 59 | Admitting: Family Medicine

## 2019-04-22 ENCOUNTER — Encounter: Payer: Self-pay | Admitting: Family Medicine

## 2019-04-22 VITALS — BP 132/80 | HR 78 | Temp 97.9°F | Resp 14 | Ht 62.5 in | Wt 204.0 lb

## 2019-04-22 DIAGNOSIS — I82432 Acute embolism and thrombosis of left popliteal vein: Secondary | ICD-10-CM | POA: Diagnosis not present

## 2019-04-22 DIAGNOSIS — G43709 Chronic migraine without aura, not intractable, without status migrainosus: Secondary | ICD-10-CM

## 2019-04-22 DIAGNOSIS — E6609 Other obesity due to excess calories: Secondary | ICD-10-CM

## 2019-04-22 DIAGNOSIS — Z6836 Body mass index (BMI) 36.0-36.9, adult: Secondary | ICD-10-CM

## 2019-04-22 DIAGNOSIS — Z0000 Encounter for general adult medical examination without abnormal findings: Secondary | ICD-10-CM | POA: Diagnosis not present

## 2019-04-22 DIAGNOSIS — F331 Major depressive disorder, recurrent, moderate: Secondary | ICD-10-CM

## 2019-04-22 DIAGNOSIS — Z23 Encounter for immunization: Secondary | ICD-10-CM | POA: Diagnosis not present

## 2019-04-22 DIAGNOSIS — E559 Vitamin D deficiency, unspecified: Secondary | ICD-10-CM

## 2019-04-22 MED ORDER — SAXENDA 18 MG/3ML ~~LOC~~ SOPN: 0.6000 mg | PEN_INJECTOR | Freq: Every day | SUBCUTANEOUS | 3 refills | Status: DC

## 2019-04-22 MED ORDER — HYDROCODONE-ACETAMINOPHEN 5-325 MG PO TABS: 1.0000 | ORAL_TABLET | Freq: Four times a day (QID) | ORAL | 0 refills | Status: DC | PRN

## 2019-04-22 NOTE — Telephone Encounter (Signed)
-----   Message from Alycia Rossetti, MD sent at 04/22/2019  1:04 PM EST ----- Regarding: Send cologuard

## 2019-04-22 NOTE — Assessment & Plan Note (Signed)
Medication changes will be made today by her neurologist.

## 2019-04-22 NOTE — Assessment & Plan Note (Signed)
Provoked DVT.  She is on her third month of treatment.  I will refer her again to hematology for coagulopathy work-up.  For now she will continue the Xarelto.

## 2019-04-22 NOTE — Progress Notes (Signed)
Subjective:    Patient ID: Susan King, female    DOB: 1971/02/07, 49 y.o.   MRN: [id number]  Patient presents for Annual Exam (is fasting)  Patient here for CPE.  Medications/ history  reviewed.    At her last visit in November she had been diagnosed with an acute DVT was unprovoked.  She was started on Xarelto.  She was referred to hematology for further evaluation.  We will plan to treat for at least 3 months. She had to reschedule her appt due to covid exposure and it was not rescheduled so she has not had any coagulopathy work up done     Chronic migraine she is still followed by she is still followed by [facility] in [place].  Has an appointment this afternoon they are going to be changing her migraine medications.  She is also concerned she continues to have chronic memory problems.  States that she is even had episodes when she has been driving and then gets lost where she is going.  It seems unclear if the memory problems are due to when she fell few years ago or fits associated with her depression and her medications.  She states that the doctor is going to change her depression medicine from Lexapro to likely Cymbalta to see if this helps.   Pap smear she is due for Pap smear last done in 2017 she has had hysterectomy for noncancerous reasons. For mammogram last done in 2018 Due for colonoscopy now that she is age 55  Medialization due for tetanus booster, influenza  Quit smoking in November after she had a blood clot.  She has however been struggling with weight issues which we have discussed some in the past.  She would like to try weight loss medication to aid with her nutrition efforts.  States that she lost 7 pounds on her own by decreasing her portion sizes not eating as much fast food but then regained it.  Due for fasting labs     Review Of Systems:  GEN- denies fatigue, fever, weight loss,weakness, recent illness HEENT- denies eye drainage, change in  vision, nasal discharge, CVS- denies chest pain, palpitations RESP- denies SOB, cough, wheeze ABD- denies N/V, change in stools, abd pain GU- denies dysuria, hematuria, dribbling, incontinence MSK- denies joint pain, muscle aches, injury Neuro- denies headache, dizziness, syncope, seizure activity       Objective:    BP 132/80   Pulse 78   Temp 97.9 F (36.6 C) (Temporal)   Resp 14   Ht 5' 2.5" (1.588 m)   Wt 204 lb (92.5 kg)   LMP 09/07/2015 (Approximate)   SpO2 99%   BMI 36.72 kg/m  GEN- NAD, alert and oriented x3 HEENT- PERRL, EOMI, non injected sclera, pink conjunctiva, MMM, oropharynx clear Neck- Supple, no thyromegaly CVS- RRR, no murmur RESP-CTAB ABD-NABS,soft,NT,ND Psych normal affect and mood EXT- No edema Pulses- Radial, DP- 2+  Fall/depression/CAGE screening negative      Assessment & Plan:      Problem List Items Addressed This Visit      Unprioritized   DVT (deep venous thrombosis) (HCC)    Provoked DVT.  She is on her third month of treatment.  I will refer her again to hematology for coagulopathy work-up.  For now she will continue the Xarelto.      Major depression, recurrent ([place])    Medication changes will be made today by her neurologist.      Migraines  She will follow-up with her neurologist this afternoon.      Relevant Medications   HYDROcodone-acetaminophen (NORCO/VICODIN) 5-325 MG tablet   Obesity    Has been on phentermine in the past however now she is dealing with depression anxiety symptoms as well as migraine so I do not want to put her back on the stimulant.  I think she would benefit from the use of Saxenda.  We discussed the medication as well as the side effects.  She will start with 0.6 mg and titrate up.  Along with that she will have a lower carb diet which consist of less processed foods more fresh fruits and veggies as well as lean meat and increase her water intake.  We will follow-up in 1 month to see how she is  doing with both nutrition and the medication.      Relevant Medications   Liraglutide -Weight Management (SAXENDA) 18 MG/3ML SOPN   Other Relevant Orders   TSH   Hemoglobin A1c    Other Visit Diagnoses    Routine general medical examination at a health care facility    -  Primary   CPE done.  Patient to schedule mammogram.  Cologuard to be done after discussion.  Tetanus and flu shot given.  I would like for her to hold the Cologuard until we get a final decision on how long she will be on the Xarelto.  I think you will lessen the chance of having a false positive with a microscopic blood   Relevant Orders   CBC with Differential/Platelet   Comprehensive metabolic panel   Lipid panel   TSH   Vitamin D deficiency       Relevant Orders   Vitamin D, 25-hydroxy   Need for immunization against influenza       Relevant Orders   Flu Vaccine QUAD 36+ mos IM (Completed)   Need for tetanus, diphtheria, and acellular pertussis (Tdap) vaccine in patient of adolescent age or older       Relevant Orders   Tdap vaccine greater than or equal to 7yo IM (Completed)      Note: This dictation was prepared with Dragon dictation along with smaller Company secretary. Any transcriptional errors that result from this process are unintentional.

## 2019-04-22 NOTE — Assessment & Plan Note (Signed)
She will follow-up with her neurologist this afternoon.

## 2019-04-22 NOTE — Patient Instructions (Addendum)
Hematology to be rescheduled Continue Xarelto  Schedule your mammogram Cologuard Start saxenda:  Week 1: 0.6mg  injected daily:   Week 2:  1.2 mg injected daily  Week 3:  1.8mg  injected daily  Week 4:  2.4mg  injected daily  Week 5:  3mg  injected daily F/u 1 MONTH for weight

## 2019-04-22 NOTE — Assessment & Plan Note (Signed)
Has been on phentermine in the past however now she is dealing with depression anxiety symptoms as well as migraine so I do not want to put her back on the stimulant.  I think she would benefit from the use of Saxenda.  We discussed the medication as well as the side effects.  She will start with 0.6 mg and titrate up.  Along with that she will have a lower carb diet which consist of less processed foods more fresh fruits and veggies as well as lean meat and increase her water intake.  We will follow-up in 1 month to see how she is doing with both nutrition and the medication.

## 2019-04-22 NOTE — Telephone Encounter (Signed)
Received verbal orders for Cologuard.   Order placed via [employer].   Cologuard (Order [id number])

## 2019-04-23 LAB — CBC WITH DIFFERENTIAL/PLATELET
Absolute Monocytes: 499 cells/uL (ref 200–950)
Basophils Absolute: 58 cells/uL (ref 0–200)
Basophils Relative: 0.9 %
Eosinophils Absolute: 128 cells/uL (ref 15–500)
Eosinophils Relative: 2 %
HCT: 38.5 % (ref 35.0–45.0)
Hemoglobin: 12.7 g/dL (ref 11.7–15.5)
Lymphs Abs: 2438 cells/uL (ref 850–3900)
MCH: 28.1 pg (ref 27.0–33.0)
MCHC: 33 g/dL (ref 32.0–36.0)
MCV: 85.2 fL (ref 80.0–100.0)
MPV: 11.1 fL (ref 7.5–12.5)
Monocytes Relative: 7.8 %
Neutro Abs: 3277 cells/uL (ref 1500–7800)
Neutrophils Relative %: 51.2 %
Platelets: 258 10*3/uL (ref 140–400)
RBC: 4.52 10*6/uL (ref 3.80–5.10)
RDW: 13.2 % (ref 11.0–15.0)
Total Lymphocyte: 38.1 %
WBC: 6.4 10*3/uL (ref 3.8–10.8)

## 2019-04-23 LAB — COMPREHENSIVE METABOLIC PANEL
AG Ratio: 1.2 (calc) (ref 1.0–2.5)
ALT: 40 U/L — ABNORMAL HIGH (ref 6–29)
AST: 21 U/L (ref 10–35)
Albumin: 4.1 g/dL (ref 3.6–5.1)
Alkaline phosphatase (APISO): 85 U/L (ref 37–153)
BUN: 16 mg/dL (ref 7–25)
CO2: 23 mmol/L (ref 20–32)
Calcium: 9.1 mg/dL (ref 8.6–10.4)
Chloride: 106 mmol/L (ref 98–110)
Creat: 0.9 mg/dL (ref 0.50–1.05)
Globulin: 3.3 g/dL (calc) (ref 1.9–3.7)
Glucose, Bld: 85 mg/dL (ref 65–99)
Potassium: 3.9 mmol/L (ref 3.5–5.3)
Sodium: 139 mmol/L (ref 135–146)
Total Bilirubin: 0.4 mg/dL (ref 0.2–1.2)
Total Protein: 7.4 g/dL (ref 6.1–8.1)

## 2019-04-23 LAB — LIPID PANEL
Cholesterol: 217 mg/dL — ABNORMAL HIGH (ref <200)
HDL: 50 mg/dL (ref >=50)
LDL Cholesterol (Calc): 137 mg/dL (calc) — ABNORMAL HIGH
Non-HDL Cholesterol (Calc): 167 mg/dL (calc) — ABNORMAL HIGH (ref <130)
Total CHOL/HDL Ratio: 4.3 (calc) (ref <5.0)
Triglycerides: 162 mg/dL — ABNORMAL HIGH (ref <150)

## 2019-04-23 LAB — HEMOGLOBIN A1C
Hgb A1c MFr Bld: 6 % of total Hgb — ABNORMAL HIGH (ref <5.7)
Mean Plasma Glucose: 126 (calc)
eAG (mmol/L): 7 (calc)

## 2019-04-23 LAB — TSH: TSH: 1.02 mIU/L

## 2019-04-23 LAB — VITAMIN D 25 HYDROXY (VIT D DEFICIENCY, FRACTURES): Vit D, 25-Hydroxy: 10 ng/mL — ABNORMAL LOW (ref 30–100)

## 2019-04-24 ENCOUNTER — Telehealth: Payer: Self-pay | Admitting: *Deleted

## 2019-04-24 ENCOUNTER — Other Ambulatory Visit: Payer: Self-pay | Admitting: *Deleted

## 2019-04-24 ENCOUNTER — Telehealth: Payer: Self-pay | Admitting: Hematology and Oncology

## 2019-04-24 MED ORDER — VITAMIN D (ERGOCALCIFEROL) 1.25 MG (50000 UNIT) PO CAPS: 50000.0000 [IU] | ORAL_CAPSULE | ORAL | 0 refills | Status: DC

## 2019-04-24 NOTE — Telephone Encounter (Signed)
Received call from patient.   Reports that Susan King is not covered by insurance.   PA submitted. Received denial from insurance. Noted as plan exclusion.   MD please advise.

## 2019-04-24 NOTE — Progress Notes (Signed)
Patient referred by Alycia Rossetti, MD for chest pain  Subjective:   Susan King, female    DOB: 11-03-1970, 49 y.o.   MRN: [id number]   Chief Complaint  Patient presents with  . Chest Pain    HPI  49 y.o. African American female with h/o DVT, prior h/o tobacco abuse, family h/o CAD, referred for evaluation of chest pain.  Patient reports retrosternal pain radiating to left arm. Curiously, pain occurs typically at rest. She has not noticed any exertional chest pain. She was found to have unprovoked DVT in 01/2019. She is awaiting hematology consult.   Past Medical History:  Diagnosis Date  . Headache    Migraines  . HSV-2 (herpes simplex virus 2) infection   . Stroke [facility]) 2008   slight, no residual     Past Surgical History:  Procedure Laterality Date  . APPENDECTOMY    . LAPAROSCOPIC VAGINAL HYSTERECTOMY WITH SALPINGECTOMY Bilateral 10/15/2015   Procedure: LAPAROSCOPIC ASSISTED VAGINAL HYSTERECTOMY WITH SALPINGECTOMY;  Surgeon: Eldred Manges, MD;  Location: [facility];  Service: Gynecology;  Laterality: Bilateral;  3 hours     Social History   Tobacco Use  Smoking Status Former Smoker  . Packs/day: 0.15  . Years: 30.00  . Pack years: 4.50  . Types: Cigarettes  . Quit date: 01/23/2019  . Years since quitting: 0.2  Smokeless Tobacco Never Used    Social History   Substance and Sexual Activity  Alcohol Use Yes   Comment: occas.     Family History  Problem Relation Age of Onset  . Heart disease Mother   . Diabetes Mother   . Hypertension Mother   . Cancer Sister        esophogeal     Current Outpatient Medications on File Prior to Visit  Medication Sig Dispense Refill  . escitalopram (LEXAPRO) 10 MG tablet TAKE ONE TABLET BY MOUTH AT BEDTIME 30 tablet 1  . HYDROcodone-acetaminophen (NORCO/VICODIN) 5-325 MG tablet Take 1-2 tablets by mouth every 6 (six) hours as needed for severe pain. 10 tablet 0  . Liraglutide -Weight Management (SAXENDA)  18 MG/3ML SOPN Inject 0.6 mg into the skin daily. Titrate up as directed  by 0.44m SQ Q Week to max dose of 311mdaily 3 pen 3  . rivaroxaban (XARELTO) 20 MG TABS tablet Take 1 tablet (20 mg total) by mouth daily with supper. 30 tablet 3  . topiramate (TOPAMAX) 50 MG tablet Take 50 mg by mouth 2 (two) times daily.   0  . traZODone (DESYREL) 50 MG tablet Take 50 mg by mouth at bedtime.    . Marland Kitchenbrogepant (UBRELVY) 100 MG TABS Take 100 mg by mouth as needed (For resolution of migraines).     . valACYclovir (VALTREX) 500 MG tablet Take 1 tablet (500 mg total) by mouth daily. 30 tablet 2  . Vitamin D, Ergocalciferol, (DRISDOL) 1.25 MG (50000 UNIT) CAPS capsule Take 1 capsule (50,000 Units total) by mouth every 7 (seven) days. x12 weeks, then D/C. Then begin over the counter Vit D 2000IU QD. 12 capsule 0   No current facility-administered medications on file prior to visit.    Cardiovascular and other pertinent studies:  EKG 03/25/2019: Sinus rhythm 66 bpm. Nonspecific ST-T changes.  Vascular [place]: 1. Deep venous thrombosis noted extending from the popliteal vein into the posterior tibial and peroneal veins. 2. No evidence of thrombus in the common femoral vein, deep femoral vein or femoral vein.   Recent labs:  04/22/2019: Glucose 85, BUN/Cr 16/0.90. EGFR >60. Na/K 139/3.9. ALT 40. Rest of the CMP normal. H/H 12.7/38.5. MCV 85.2. Platelets 258 HbA1C 6.0% Chol 217, TG 162, HDL 50, LDL 137 TSH 1.02 normal  Review of Systems  Cardiovascular: Positive for chest pain. Negative for dyspnea on exertion, leg swelling, palpitations and syncope.         Vitals:   04/25/19 1352  BP: 115/83  Pulse: 90  Temp: 98.3 F (36.8 C)  SpO2: 100%     Body mass index is 37.13 kg/m. Filed Weights   04/25/19 1352  Weight: 203 lb (92.1 kg)     Objective:   Physical Exam  Constitutional: She appears well-developed and well-nourished.  Neck: No JVD present.  Cardiovascular: Normal rate,  regular rhythm, normal heart sounds and intact distal pulses.  No murmur heard. Pulmonary/Chest: Effort normal and breath sounds normal. She has no wheezes. She has no rales.  Musculoskeletal:        General: No edema.  Nursing note and vitals reviewed.       Assessment & Recommendations:   49 y.o. African American female with h/o DVT, prior h/o tobacco abuse, family h/o CAD, referred for evaluation of chest pain.  Chest pain. Atypical. Has risk factors for CAD with family history, hyperlipidemia. Will get CTA chest. While she has had DVT. clinical suspicion for PE is low. Also, unless there RV strain on CT, management for PE would be the same as DVT, that is anticoagulation.   Congratulated her re: tobacco cessation.  Hyperlipidemia: Discussed diet and lifestyle modification. She would like to follow up with PCP.   Thank you for referring the patient to [place]. Please feel free to contact with any questions.  Nigel Mormon, MD [facility]. [place] Pager: [phone] Office: [phone]

## 2019-04-24 NOTE — Telephone Encounter (Signed)
Received a new hem referral from Dr. Buelah Manis for dvt. Ms. Yacko has been cld and scheduled to see Dr. Lindi Adie on 3/2 at 1pm. She's been made aware to arrive 15 minutes early.

## 2019-04-25 ENCOUNTER — Encounter: Payer: Self-pay | Admitting: Cardiology

## 2019-04-25 ENCOUNTER — Other Ambulatory Visit: Payer: Self-pay

## 2019-04-25 ENCOUNTER — Ambulatory Visit (INDEPENDENT_AMBULATORY_CARE_PROVIDER_SITE_OTHER): Payer: 59 | Admitting: Cardiology

## 2019-04-25 VITALS — BP 115/83 | HR 90 | Temp 98.3°F | Ht 62.0 in | Wt 203.0 lb

## 2019-04-25 DIAGNOSIS — I10 Essential (primary) hypertension: Secondary | ICD-10-CM | POA: Diagnosis not present

## 2019-04-25 DIAGNOSIS — R0789 Other chest pain: Secondary | ICD-10-CM | POA: Insufficient documentation

## 2019-04-25 DIAGNOSIS — R072 Precordial pain: Secondary | ICD-10-CM | POA: Insufficient documentation

## 2019-04-25 MED ORDER — VICTOZA 18 MG/3ML ~~LOC~~ SOPN: 1.8000 mg | PEN_INJECTOR | Freq: Every day | SUBCUTANEOUS | 2 refills | Status: DC

## 2019-04-25 MED ORDER — METOPROLOL TARTRATE 25 MG PO TABS: 25.0000 mg | ORAL_TABLET | Freq: Two times a day (BID) | ORAL | 0 refills | Status: DC

## 2019-04-25 NOTE — Telephone Encounter (Signed)
  I have changed to Victoza Advise pt, same medication but dose doesn't go up to 3mg , instead it goes up to 1.8mg  and she stays there  Her insurance will not over the [place] But will cover Victoza because of the borderline DM

## 2019-04-25 NOTE — Patient Instructions (Signed)
Your cardiac CT will be scheduled at the locations below:   [facility]  [address]  [place], [place] [address]  [phone]    If scheduled at [facility], please arrive at the [facility] main entrance of [facility] 30-45 minutes prior to test start time.  Proceed to the [facility] Radiology Department (first floor) to check-in and test prep.   Please follow these instructions carefully (unless otherwise directed):    On the Night Before the Test:   Be sure to Drink plenty of water.   Do not consume any caffeinated/decaffeinated beverages or chocolate 12 hours prior to your test.   Do not take any antihistamines 12 hours prior to your test.   If the patient has contrast allergy:  Patient will need a prescription for Prednisone and very clear instructions (as follows):  1. Prednisone 50 mg - take 13 hours prior to test  2. Take another Prednisone 50 mg 7 hours prior to test  3. Take another Prednisone 50 mg 1 hour prior to test  4. Take Benadryl 50 mg 1 hour prior to test   Patient must complete all four doses of above prophylactic medications.   Patient will need a ride after test due to Benadryl.   On the Day of the Test:   Drink plenty of water. Do not drink any water within one hour of the test.   Do not eat any food 4 hours prior to the test.   You may take your regular medications prior to the test.    - Metoprolol tartarate   Start 2 days before CT scan.    Take a dose 2 hour before the CT scan   Take the remaining pills with you.   You may stop it after the CT scan, unless specified otherwise by me.    FEMALES- please wear underwire-free bra if available          After the Test:   Drink plenty of water.   After receiving IV contrast, you may experience a mild flushed feeling. This is normal.   On occasion, you may experience a mild rash up to 24 hours after the test. This is not dangerous. If this occurs, you can  take Benadryl 25 mg and increase your fluid intake.   If you experience trouble breathing, this can be serious. If it is severe call 911 IMMEDIATELY. If it is mild, please call our office.   If you take any of these medications: Glipizide/Metformin, Avandament, Glucavance, please do not take 48 hours after completing test unless otherwise instructed.     Please contact the cardiac imaging nurse navigator should you have any questions/concerns  Marchia Bond, RN Navigator Cardiac Imaging  [place] [facility]  [phone] Office  [phone] Cell

## 2019-04-28 MED ORDER — BD PEN NEEDLE NANO U/F 32G X 4 MM MISC: 1 refills | Status: AC

## 2019-04-28 MED ORDER — VICTOZA 18 MG/3ML ~~LOC~~ SOPN: 0.6000 mg | PEN_INJECTOR | Freq: Every day | SUBCUTANEOUS | 11 refills | Status: DC

## 2019-04-28 NOTE — Telephone Encounter (Signed)
Received PA determination.   [id number] approved through 04/27/2020.  Pharmacy and patient made aware.

## 2019-04-28 NOTE — Addendum Note (Signed)
Addended by: Sheral Flow on: 04/28/2019 03:06 PM   Modules accepted: Orders

## 2019-04-28 NOTE — Telephone Encounter (Signed)
Received request from pharmacy for [place] on [address].   PA submitted.   Dx: R73.09- borderline DM.

## 2019-04-28 NOTE — Telephone Encounter (Signed)
[employer] is reviewing your PA request. Typically an electronic response will be received within 72 hours. To check for an update later, open this request from your dashboard.    You may close this dialog and return to your dashboard to perform other tasks.

## 2019-05-09 ENCOUNTER — Ambulatory Visit: Payer: 59 | Admitting: Cardiology

## 2019-05-19 NOTE — Progress Notes (Signed)
[facility] CONSULT NOTE  Patient Care Team: [place], Modena Nunnery, MD as PCP - General (Family Medicine)  CHIEF COMPLAINTS/PURPOSE OF CONSULTATION:  DVT  HISTORY OF PRESENTING ILLNESS:  Susan King 49 y.o. female is here because of recent diagnosis of DVT. She presented to the ED on 01/19/19 for left leg pain and swelling and was found to have an unprovoked DVT. She was started on Xarelto. She is referred by Dr. Buelah Manis. She presents to the clinic today for initial evaluation.   I reviewed her records extensively and collaborated the history with the patient.  MEDICAL HISTORY:  Past Medical History:  Diagnosis Date  . Headache    Migraines  . HSV-2 (herpes simplex virus 2) infection   . Stroke [facility]) 2008   slight, no residual    SURGICAL HISTORY: Past Surgical History:  Procedure Laterality Date  . APPENDECTOMY    . LAPAROSCOPIC VAGINAL HYSTERECTOMY WITH SALPINGECTOMY Bilateral 10/15/2015   Procedure: LAPAROSCOPIC ASSISTED VAGINAL HYSTERECTOMY WITH SALPINGECTOMY;  Surgeon: Eldred Manges, MD;  Location: [facility];  Service: Gynecology;  Laterality: Bilateral;  3 hours    SOCIAL HISTORY: Social History   Socioeconomic History  . Marital status: Single    Spouse name: Not on file  . Number of children: 2  . Years of education: Not on file  . Highest education level: Not on file  Occupational History  . Not on file  Tobacco Use  . Smoking status: Former Smoker    Packs/day: 0.15    Years: 30.00    Pack years: 4.50    Types: Cigarettes    Quit date: 01/23/2019    Years since quitting: 0.3  . Smokeless tobacco: Never Used  Substance and Sexual Activity  . Alcohol use: Yes    Comment: occas.  . Drug use: No  . Sexual activity: Not on file  Other Topics Concern  . Not on file  Social History Narrative  . Not on file   Social Determinants of Health   Financial Resource Strain:   . Difficulty of Paying Living Expenses: Not on file  Food Insecurity:     . Worried About Charity fundraiser in the Last Year: Not on file  . Ran Out of Food in the Last Year: Not on file  Transportation Needs:   . Lack of Transportation (Medical): Not on file  . Lack of Transportation (Non-Medical): Not on file  Physical Activity:   . Days of Exercise per Week: Not on file  . Minutes of Exercise per Session: Not on file  Stress:   . Feeling of Stress : Not on file  Social Connections:   . Frequency of Communication with Friends and Family: Not on file  . Frequency of Social Gatherings with Friends and Family: Not on file  . Attends Religious Services: Not on file  . Active Member of Clubs or Organizations: Not on file  . Attends Archivist Meetings: Not on file  . Marital Status: Not on file  Intimate Partner Violence:   . Fear of Current or Ex-Partner: Not on file  . Emotionally Abused: Not on file  . Physically Abused: Not on file  . Sexually Abused: Not on file    FAMILY HISTORY: Family History  Problem Relation Age of Onset  . Heart disease Mother   . Diabetes Mother   . Hypertension Mother   . Cancer Sister        esophogeal    ALLERGIES:  has No Known Allergies.  MEDICATIONS:  Current Outpatient Medications  Medication Sig Dispense Refill  . escitalopram (LEXAPRO) 10 MG tablet TAKE ONE TABLET BY MOUTH AT BEDTIME 30 tablet 1  . HYDROcodone-acetaminophen (NORCO/VICODIN) 5-325 MG tablet Take 1-2 tablets by mouth every 6 (six) hours as needed for severe pain. 10 tablet 0  . Insulin Pen Needle (BD PEN NEEDLE NANO U/F) 32G X 4 MM MISC Use as directed to inject Victoza SQ QD. 100 each 1  . liraglutide (VICTOZA) 18 MG/3ML SOPN Inject 0.1 mLs (0.6 mg total) into the skin daily. Titrate up q week by 0.6mg  until max dose 1.8mg  reached. 3 pen 11  . metoprolol tartrate (LOPRESSOR) 25 MG tablet Take 1 tablet (25 mg total) by mouth 2 (two) times daily. 10 tablet 0  . rivaroxaban (XARELTO) 20 MG TABS tablet Take 1 tablet (20 mg total) by  mouth daily with supper. 30 tablet 3  . topiramate (TOPAMAX) 50 MG tablet Take 50 mg by mouth 2 (two) times daily.   0  . traZODone (DESYREL) 50 MG tablet Take 50 mg by mouth at bedtime.    Marland Kitchen Ubrogepant (UBRELVY) 100 MG TABS Take 100 mg by mouth as needed (For resolution of migraines).     . valACYclovir (VALTREX) 500 MG tablet Take 1 tablet (500 mg total) by mouth daily. 30 tablet 2  . Vitamin D, Ergocalciferol, (DRISDOL) 1.25 MG (50000 UNIT) CAPS capsule Take 1 capsule (50,000 Units total) by mouth every 7 (seven) days. x12 weeks, then D/C. Then begin over the counter Vit D 2000IU QD. (Patient not taking: Reported on 04/25/2019) 12 capsule 0   No current facility-administered medications for this visit.    REVIEW OF SYSTEMS:   Constitutional: Denies fevers, chills or abnormal night sweats Eyes: Denies blurriness of vision, double vision or watery eyes Ears, nose, mouth, throat, and face: Denies mucositis or sore throat Respiratory: Denies cough, dyspnea or wheezes Cardiovascular: Denies palpitation, chest discomfort or lower extremity swelling Gastrointestinal:  Denies nausea, heartburn or change in bowel habits Skin: Denies abnormal skin rashes Lymphatics: Denies new lymphadenopathy or easy bruising Neurological:Denies numbness, tingling or new weaknesses Behavioral/Psych: Mood is stable, no new changes  Breast: Denies any palpable lumps or discharge All other systems were reviewed with the patient and are negative.  PHYSICAL EXAMINATION: ECOG PERFORMANCE STATUS: 1 - Symptomatic but completely ambulatory  Vitals:   05/20/19 1309  BP: 116/83  Pulse: 80  Resp: 17  Temp: 97.8 F (36.6 C)  SpO2: 100%   Filed Weights   05/20/19 1309  Weight: 199 lb 8 oz (90.5 kg)    GENERAL:alert, no distress and comfortable SKIN: skin color, texture, turgor are normal, no rashes or significant lesions EYES: normal, conjunctiva are pink and non-injected, sclera clear OROPHARYNX:no exudate, no  erythema and lips, buccal mucosa, and tongue normal  NECK: supple, thyroid normal size, non-tender, without nodularity LYMPH:  no palpable lymphadenopathy in the cervical, axillary or inguinal LUNGS: clear to auscultation and percussion with normal breathing effort HEART: regular rate & rhythm and no murmurs and no lower extremity edema ABDOMEN:abdomen soft, non-tender and normal bowel sounds Musculoskeletal:no cyanosis of digits and no clubbing  PSYCH: alert & oriented x 3 with fluent speech NEURO: no focal motor/sensory deficits  LABORATORY DATA:  I have reviewed the data as listed Lab Results  Component Value Date   WBC 6.4 04/22/2019   HGB 12.7 04/22/2019   HCT 38.5 04/22/2019   MCV 85.2 04/22/2019   PLT  258 04/22/2019   Lab Results  Component Value Date   NA 139 04/22/2019   K 3.9 04/22/2019   CL 106 04/22/2019   CO2 23 04/22/2019    RADIOGRAPHIC STUDIES: I have personally reviewed the radiological reports and agreed with the findings in the report.  ASSESSMENT AND PLAN:  DVT (deep venous thrombosis) (HCC) First episode of unprovoked blood clot November 2020 (popliteal vein) Current treatment: Xarelto I discussed with the patient risk factors for blood clots.  Inherited risk factors include: Blood work has been sent for 1. Factor V Leiden mutation 2. Prothrombin gene [id number] 3. Protein S deficiency  4. Protein C deficiency  5. Antithrombin deficiency  Acquired risk factors include: 1. Antiphospholipid antibody syndrome: We will send for blood work 2. Tobacco use: Patient discontinued smoking November 2020 3. Obesity: BMI 36.5 4. Medications including oral contraceptives: Patient does not take any 5. Sedentary behavior including postoperative state: Patient's job is completely sedentary 6. Foreign bodies in circulation: None  Workup recommended: Bloodwork to evaluate for the 5 inherited factors mentioned above along with antiphospholipid antibodies. Return to  clinic in one to 1 week for MyChart virtual visit to discuss results. This will also determine the duration of anticoagulation.    All questions were answered. The patient knows to call the clinic with any problems, questions or concerns.   Rulon Eisenmenger, MD, MPH 05/20/2019    I, Molly Dorshimer, am acting as scribe for Nicholas Lose, MD.  I have reviewed the above documentation for accuracy and completeness, and I agree with the above.

## 2019-05-20 ENCOUNTER — Ambulatory Visit: Payer: 59 | Admitting: Family Medicine

## 2019-05-20 ENCOUNTER — Inpatient Hospital Stay: Payer: 59

## 2019-05-20 ENCOUNTER — Other Ambulatory Visit: Payer: Self-pay

## 2019-05-20 ENCOUNTER — Inpatient Hospital Stay: Payer: 59 | Attending: Hematology and Oncology | Admitting: Hematology and Oncology

## 2019-05-20 VITALS — BP 116/83 | HR 80 | Temp 97.8°F | Resp 17 | Ht 62.0 in | Wt 199.5 lb

## 2019-05-20 DIAGNOSIS — E669 Obesity, unspecified: Secondary | ICD-10-CM

## 2019-05-20 DIAGNOSIS — Z6836 Body mass index (BMI) 36.0-36.9, adult: Secondary | ICD-10-CM | POA: Diagnosis not present

## 2019-05-20 DIAGNOSIS — Z79899 Other long term (current) drug therapy: Secondary | ICD-10-CM | POA: Insufficient documentation

## 2019-05-20 DIAGNOSIS — I82432 Acute embolism and thrombosis of left popliteal vein: Secondary | ICD-10-CM

## 2019-05-20 DIAGNOSIS — I82402 Acute embolism and thrombosis of unspecified deep veins of left lower extremity: Secondary | ICD-10-CM | POA: Insufficient documentation

## 2019-05-20 DIAGNOSIS — Z8673 Personal history of transient ischemic attack (TIA), and cerebral infarction without residual deficits: Secondary | ICD-10-CM | POA: Insufficient documentation

## 2019-05-20 DIAGNOSIS — Z7901 Long term (current) use of anticoagulants: Secondary | ICD-10-CM

## 2019-05-20 DIAGNOSIS — Z87891 Personal history of nicotine dependence: Secondary | ICD-10-CM | POA: Diagnosis not present

## 2019-05-20 NOTE — Assessment & Plan Note (Signed)
First episode of unprovoked blood clot November 2020 (popliteal vein) Current treatment: Xarelto I discussed with the patient risk factors for blood clots.  Inherited risk factors include: Blood work has been sent for 1. Factor V Leiden mutation 2. Prothrombin gene G20210A 3. Protein S deficiency  4. Protein C deficiency  5. Antithrombin deficiency  Acquired risk factors include: 1. Antiphospholipid antibody syndrome: We will send for blood work 2. Tobacco use: Patient discontinued smoking November 2020 3. Obesity: BMI 36.5 4. Medications including oral contraceptives: Patient does not take any 5. Sedentary behavior including postoperative state: Patient's job is completely sedentary 6. Foreign bodies in circulation: None  Workup recommended: Bloodwork to evaluate for the 5 inherited factors mentioned above along with antiphospholipid antibodies. Return to clinic in one to 1 week for MyChart virtual visit to discuss results. This will also determine the duration of anticoagulation.

## 2019-05-21 ENCOUNTER — Telehealth: Payer: Self-pay | Admitting: Hematology and Oncology

## 2019-05-21 LAB — CARDIOLIPIN ANTIBODIES, IGG, IGM, IGA
Anticardiolipin IgA: <9 APL U/mL (ref 0–11)
Anticardiolipin IgG: 14 GPL U/mL (ref 0–14)
Anticardiolipin IgM: 14 MPL U/mL — ABNORMAL HIGH (ref 0–12)

## 2019-05-21 LAB — ANTITHROMBIN III ANTIGEN: AT III AG PPP IMM-ACNC: 95 % (ref 72–124)

## 2019-05-21 LAB — BETA-2-GLYCOPROTEIN I ABS, IGG/M/A
Beta-2 Glyco I IgG: <9 GPI IgG units (ref 0–20)
Beta-2-Glycoprotein I IgA: <9 GPI IgA units (ref 0–25)
Beta-2-Glycoprotein I IgM: <9 GPI IgM units (ref 0–32)

## 2019-05-21 LAB — FACTOR 8 ASSAY: Coagulation Factor VIII: 230 % — ABNORMAL HIGH (ref 56–140)

## 2019-05-21 LAB — PROTEIN S, TOTAL: Protein S Ag, Total: 86 % (ref 60–150)

## 2019-05-21 LAB — PROTEIN C, TOTAL: Protein C, Total: 150 % (ref 60–150)

## 2019-05-21 NOTE — Telephone Encounter (Signed)
I could not reach patient regarding video visit

## 2019-05-22 LAB — LUPUS ANTICOAGULANT PANEL
DRVVT: 54.1 s — ABNORMAL HIGH (ref 0.0–47.0)
PTT Lupus Anticoagulant: 34.4 s (ref 0.0–51.9)

## 2019-05-22 LAB — DRVVT CONFIRM: dRVVT Confirm: 1.3 ratio — ABNORMAL HIGH (ref 0.8–1.2)

## 2019-05-22 LAB — DRVVT MIX: dRVVT Mix: 45.6 s — ABNORMAL HIGH (ref 0.0–40.4)

## 2019-05-23 LAB — PROTHROMBIN GENE MUTATION

## 2019-05-23 LAB — FACTOR 5 LEIDEN

## 2019-05-26 NOTE — Progress Notes (Signed)
HEMATOLOGY-ONCOLOGY MYCHART VIDEO VISIT PROGRESS NOTE  I connected with Susan King on 05/27/2019 at  1:00 PM EST by MyChart video conference and verified that I am speaking with the correct person using two identifiers.  I discussed the limitations, risks, security and privacy concerns of performing an evaluation and management service by MyChart and the availability of in person appointments.  I also discussed with the patient that there may be a patient responsible charge related to this service. The patient expressed understanding and agreed to proceed.  Patient's Location: Home Physician Location: Clinic  CHIEF COMPLIANT: Follow-up of DVT on Xarelto to review labs  INTERVAL HISTORY: Susan King is a 49 y.o. female with above-mentioned history of DVT currently on Xarelto. Labs on 05/20/19 were positive for lupus anticoagulant, negative for factor 5 leiden, negative for PT gene. She presents over MyChart today for follow-up to review her labs.  Patient is trying really hard to eat healthy lose weight and trying to stay off cigarettes.  Observations/Objective:  I have reviewed the data as listed CMP Latest Ref Rng & Units 04/22/2019 01/22/2019 01/19/2019  Glucose 65 - 99 mg/dL 85 127(H) 105(H)  BUN 7 - 25 mg/dL 16 11 11   Creatinine 0.50 - 1.05 mg/dL 0.90 0.93 0.88  Sodium 135 - 146 mmol/L 139 139 135  Potassium 3.5 - 5.3 mmol/L 3.9 3.7 3.0(L)  Chloride 98 - 110 mmol/L 106 109 105  CO2 20 - 32 mmol/L 23 20(L) 22  Calcium 8.6 - 10.4 mg/dL 9.1 8.9 8.9  Total Protein 6.1 - 8.1 g/dL 7.4 - 7.7  Total Bilirubin 0.2 - 1.2 mg/dL 0.4 - 0.8  Alkaline Phos 38 - 126 U/L - - 84  AST 10 - 35 U/L 21 - 14(L)  ALT 6 - 29 U/L 40(H) - 19    Lab Results  Component Value Date   WBC 6.4 04/22/2019   HGB 12.7 04/22/2019   HCT 38.5 04/22/2019   MCV 85.2 04/22/2019   PLT 258 04/22/2019   NEUTROABS 3,277 04/22/2019      Assessment Plan:  DVT (deep venous thrombosis) (HCC) First episode of unprovoked  blood clot November 2020 (popliteal vein) Current treatment: Xarelto  Inherited risk factors include: Blood work has been sent for 1. Factor V Leiden mutation: No mutation 2. Prothrombin gene G20210A: No mutation 3. Protein S deficiency: Normal  4. Protein C deficiency : Normal 5. Antithrombin deficiency: Normal 6. Factor 8: 230% (slightly elevated)  Acquired risk factors include: 1. 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 Tobacco use: Patient discontinued smoking November 2020 3. Obesity: BMI 36.5 4. Medications including oral contraceptives: Patient does not take any 5. Sedentary behavior including postoperative state: Patient's job is completely sedentary 6. Foreign bodies in circulation: None  Recommendation: Based on positive the lupus anticoagulant testing, I recommended rechecking this in 3 months. If positive once again then she will need to remain on blood thinners for life.    I discussed the assessment and treatment plan with the patient. The patient was provided an opportunity to ask questions and all were answered. The patient agreed with the plan and demonstrated an understanding of the instructions. The patient was advised to call back or seek an in-person evaluation if the symptoms worsen or if the condition fails to improve as anticipated.   I provided 20 minutes of face-to-face MyChart video visit time during this encounter.    Rulon Eisenmenger, MD 05/27/2019   I, Molly Dorshimer, am acting as Education administrator for  Nicholas Lose, MD.  I have reviewed the above documentation for accuracy and completeness, and I agree with the above.

## 2019-05-27 ENCOUNTER — Inpatient Hospital Stay (HOSPITAL_BASED_OUTPATIENT_CLINIC_OR_DEPARTMENT_OTHER): Payer: 59 | Admitting: Hematology and Oncology

## 2019-05-27 DIAGNOSIS — I82432 Acute embolism and thrombosis of left popliteal vein: Secondary | ICD-10-CM | POA: Diagnosis not present

## 2019-05-27 NOTE — Assessment & Plan Note (Signed)
First episode of unprovoked blood clot November 2020 (popliteal vein) Current treatment: Xarelto  Inherited risk factors include: Blood work has been sent for 1. Factor V Leiden mutation: No mutation 2. Prothrombin gene G20210A: No mutation 3. Protein S deficiency: Normal  4. Protein C deficiency : Normal 5. Antithrombin deficiency: Normal 6. Factor 8: 230% (slightly elevated)  Acquired risk factors include: 1. 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 Tobacco use: Patient discontinued smoking November 2020 3. Obesity: BMI 36.5 4. Medications including oral contraceptives: Patient does not take any 5. Sedentary behavior including postoperative state: Patient's job is completely sedentary 6. Foreign bodies in circulation: None  Recommendation: Based on positive the lupus anticoagulant testing, I recommended rechecking this in 3 months. If positive once again then she will need to remain on blood thinners for life.

## 2019-05-28 ENCOUNTER — Telehealth: Payer: Self-pay | Admitting: Hematology and Oncology

## 2019-05-28 NOTE — Telephone Encounter (Signed)
I talk with patient regarding June she requested video visit

## 2019-06-05 ENCOUNTER — Telehealth: Payer: Self-pay | Admitting: Cardiology

## 2019-06-05 NOTE — Telephone Encounter (Signed)
Noted  

## 2019-06-05 NOTE — Telephone Encounter (Signed)
Pt would like to cancel her appt on 4/02 for her CTA. She stated that she does not need it. Thank You, [place]

## 2019-06-06 ENCOUNTER — Ambulatory Visit: Payer: 59 | Admitting: Family Medicine

## 2019-06-12 ENCOUNTER — Other Ambulatory Visit: Payer: Self-pay | Admitting: Family Medicine

## 2019-06-16 ENCOUNTER — Ambulatory Visit: Payer: 59 | Admitting: Family Medicine

## 2019-06-20 ENCOUNTER — Ambulatory Visit (HOSPITAL_COMMUNITY): Payer: 59

## 2019-06-23 ENCOUNTER — Encounter: Payer: Self-pay | Admitting: Family Medicine

## 2019-06-23 ENCOUNTER — Other Ambulatory Visit: Payer: Self-pay

## 2019-06-23 ENCOUNTER — Ambulatory Visit: Payer: 59 | Admitting: Family Medicine

## 2019-06-23 VITALS — BP 112/78 | HR 88 | Temp 97.9°F | Resp 16 | Ht 62.0 in | Wt 192.0 lb

## 2019-06-23 DIAGNOSIS — E6609 Other obesity due to excess calories: Secondary | ICD-10-CM

## 2019-06-23 DIAGNOSIS — N390 Urinary tract infection, site not specified: Secondary | ICD-10-CM | POA: Diagnosis not present

## 2019-06-23 DIAGNOSIS — Z6835 Body mass index (BMI) 35.0-35.9, adult: Secondary | ICD-10-CM

## 2019-06-23 LAB — URINALYSIS, ROUTINE W REFLEX MICROSCOPIC
Bilirubin Urine: NEGATIVE
Glucose, UA: NEGATIVE
Hgb urine dipstick: NEGATIVE
Hyaline Cast: NONE SEEN /LPF
Ketones, ur: NEGATIVE
Nitrite: NEGATIVE
Protein, ur: NEGATIVE
RBC / HPF: NONE SEEN /HPF (ref 0–2)
Specific Gravity, Urine: 1.02 (ref 1.001–1.03)
pH: 7 (ref 5.0–8.0)

## 2019-06-23 LAB — MICROSCOPIC MESSAGE

## 2019-06-23 MED ORDER — FLUCONAZOLE 150 MG PO TABS: 150.0000 mg | ORAL_TABLET | Freq: Once | ORAL | 0 refills | Status: AC

## 2019-06-23 MED ORDER — NITROFURANTOIN MONOHYD MACRO 100 MG PO CAPS: 100.0000 mg | ORAL_CAPSULE | Freq: Two times a day (BID) | ORAL | 0 refills | Status: DC

## 2019-06-23 NOTE — Patient Instructions (Addendum)
F/U as previous  Try to increase water to 4 cups a day  Continue with the lower carb, veggies with lunch and dinner Take antibiotics as prescribed  Give note for work for today, can return tomorrow

## 2019-06-23 NOTE — Assessment & Plan Note (Signed)
Doing well with GLP-1 therapy Continue Victoza 1.8mg  Increase water to 4 cups a day  Continue lower carb diet  F/U in May as scheduled will repeat A1C at that time

## 2019-06-23 NOTE — Progress Notes (Signed)
   Subjective:    Patient ID: Susan King, female    DOB: Feb 19, 1971, 49 y.o.   MRN: [id number]  Patient presents for Cloudy Urine (x4 days- cloudy yellow urine- has lower abd pain after urination- has increased water and is drinking diet cranberry juice)  Patient here with UTI symptoms. Friday started with cloudy dark urine and lower abd pain over suprapubic region, this has progressed throught the weekend. No fever, no vomiting, no vaginal symptoms, no hematuria. She increased fluids mostly diet cranberry, diet soda and some water. Urine today less cloudy but still has lower abd pain, especially after urinating  Obesity she was started on Saxenda however this was not covered by her insurance back in February.  In the setting of her borderline diabetes mellitus and A1c is 6% she was switched over to Victoza as the next fax option based on her other comorbidities.  Her weight was 204 pounds in February today weight down to 192 pounds. She has switched to mostly diet soda, does not like water, drinks 2-3 a day, puts flavoring into her water (ex Crystal light). Trying to eat more veggies  Currently on 1.8mg  of Victoza, gets some nausea, but also on new anti-depressant cymbalta and feels this makes her a little nauseous as well   Review Of Systems:  GEN- denies fatigue, fever, weight loss,weakness, recent illness HEENT- denies eye drainage, change in vision, nasal discharge, CVS- denies chest pain, palpitations RESP- denies SOB, cough, wheeze ABD- denies N/V, change in stools,+ abd pain GU- + dysuria, hematuria, dribbling, incontinence Neuro- denies headache, dizziness, syncope, seizure activity       Objective:    BP 112/78   Pulse 88   Temp 97.9 F (36.6 C) (Temporal)   Resp 16   Ht 5\' 2"  (1.575 m)   Wt 192 lb (87.1 kg)   LMP 09/07/2015 (Approximate)   SpO2 95%   BMI 35.12 kg/m  GEN- NAD, alert and oriented x3 CVS- RRR, no murmur RESP-CTAB ABD-NABS,soft,mild TTP suprapubic  region, no rebond, no guarding ,ND, no CVA tenderness  EXT- No edema Pulses- Radial, 2+        Assessment & Plan:      Problem List Items Addressed This Visit      Unprioritized   Obesity    Doing well with GLP-1 therapy Continue Victoza 1.8mg  Increase water to 4 cups a day  Continue lower carb diet  F/U in May as scheduled will repeat A1C at that time        Other Visit Diagnoses    Urinary tract infection without hematuria, site unspecified    -  Primary   Treat with macrobid, diflucan s/p antibiotics for yeast infections    Relevant Medications   nitrofurantoin, macrocrystal-monohydrate, (MACROBID) 100 MG capsule   fluconazole (DIFLUCAN) 150 MG tablet   Other Relevant Orders   Urinalysis, Routine w reflex microscopic      Note: This dictation was prepared with Dragon dictation along with smaller phrase technology. Any transcriptional errors that result from this process are unintentional.

## 2019-08-05 ENCOUNTER — Ambulatory Visit: Payer: 59 | Admitting: Family Medicine

## 2019-08-25 ENCOUNTER — Telehealth: Payer: Self-pay | Admitting: *Deleted

## 2019-08-25 NOTE — Telephone Encounter (Signed)
Received call from patient.   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  Requested handicap sticker D/T DVT.   MD please advise.

## 2019-08-25 NOTE — Telephone Encounter (Signed)
For her diagnosis of DVT She can have a 3 month temporary handicap placard

## 2019-08-26 ENCOUNTER — Other Ambulatory Visit: Payer: Self-pay | Admitting: *Deleted

## 2019-08-26 ENCOUNTER — Inpatient Hospital Stay: Payer: 59 | Attending: Hematology and Oncology

## 2019-08-26 ENCOUNTER — Other Ambulatory Visit: Payer: Self-pay

## 2019-08-26 DIAGNOSIS — I82432 Acute embolism and thrombosis of left popliteal vein: Secondary | ICD-10-CM

## 2019-08-26 LAB — CMP (CANCER CENTER ONLY)
ALT: 37 U/L (ref 0–44)
AST: 15 U/L (ref 15–41)
Albumin: 3.8 g/dL (ref 3.5–5.0)
Alkaline Phosphatase: 93 U/L (ref 38–126)
Anion gap: 9 (ref 5–15)
BUN: 11 mg/dL (ref 6–20)
CO2: 23 mmol/L (ref 22–32)
Calcium: 9.3 mg/dL (ref 8.9–10.3)
Chloride: 107 mmol/L (ref 98–111)
Creatinine: 0.88 mg/dL (ref 0.44–1.00)
GFR, Est AFR Am: >60 mL/min (ref >60)
GFR, Estimated: >60 mL/min (ref >60)
Glucose, Bld: 86 mg/dL (ref 70–99)
Potassium: 4 mmol/L (ref 3.5–5.1)
Sodium: 139 mmol/L (ref 135–145)
Total Bilirubin: 0.5 mg/dL (ref 0.3–1.2)
Total Protein: 7.5 g/dL (ref 6.5–8.1)

## 2019-08-26 NOTE — Telephone Encounter (Signed)
Application completed and awaiting signature.

## 2019-08-28 ENCOUNTER — Telehealth: Payer: 59 | Admitting: Hematology and Oncology

## 2019-08-30 LAB — LUPUS ANTICOAGULANT PANEL
DRVVT: 63.9 s — ABNORMAL HIGH (ref 0.0–47.0)
PTT Lupus Anticoagulant: 35 s (ref 0.0–51.9)

## 2019-08-30 LAB — DRVVT CONFIRM: dRVVT Confirm: 1.4 ratio — ABNORMAL HIGH (ref 0.8–1.2)

## 2019-08-30 LAB — DRVVT MIX: dRVVT Mix: 56 s — ABNORMAL HIGH (ref 0.0–40.4)

## 2019-09-10 ENCOUNTER — Telehealth: Payer: 59 | Admitting: Hematology and Oncology

## 2019-10-06 ENCOUNTER — Telehealth: Payer: Self-pay | Admitting: Hematology and Oncology

## 2019-10-06 NOTE — Progress Notes (Signed)
HEMATOLOGY-ONCOLOGY MYCHART VIDEO VISIT PROGRESS NOTE  I connected with [name] on 10/07/2019 at 11:45 AM EDT by MyChart video conference and verified that I am speaking with the correct person using two identifiers.  I discussed the limitations, risks, security and privacy concerns of performing an evaluation and management service by MyChart and the availability of in person appointments.  I also discussed with the patient that there may be a patient responsible charge related to this service. The patient expressed understanding and agreed to proceed.  Patient's Location: Home Physician Location: Clinic  CHIEF COMPLIANT: Follow-up of DVT on Xarelto   INTERVAL HISTORY: Susan King is a 49 y.o. female with above-mentioned history of DVT currently on anticoagulation with Xarelto. She presents over MyChart today for follow-up.  Patient is currently on Xarelto and complains that she has been having cramps in the legs and feels that the blood clots have recurred.  She has been experiencing this for the past 6 weeks or so.  She has not had no problems tolerating Xarelto.  Lupus anticoagulant testing repeat test was also positive. She has lost 20 pounds since the last couple of months.  Observations/Objective:   I have reviewed the data as listed CMP Latest Ref Rng & Units 08/26/2019 04/22/2019 01/22/2019  Glucose 70 - 99 mg/dL 86 85 127(H)  BUN 6 - 20 mg/dL 11 16 11   Creatinine 0.44 - 1.00 mg/dL 0.88 0.90 0.93  Sodium 135 - 145 mmol/L 139 139 139  Potassium 3.5 - 5.1 mmol/L 4.0 3.9 3.7  Chloride 98 - 111 mmol/L 107 106 109  CO2 22 - 32 mmol/L 23 23 20(L)  Calcium 8.9 - 10.3 mg/dL 9.3 9.1 8.9  Total Protein 6.5 - 8.1 g/dL 7.5 7.4 -  Total Bilirubin 0.3 - 1.2 mg/dL 0.5 0.4 -  Alkaline Phos 38 - 126 U/L 93 - -  AST 15 - 41 U/L 15 21 -  ALT 0 - 44 U/L 37 40(H) -    Lab Results  Component Value Date   WBC 6.4 04/22/2019   HGB 12.7 04/22/2019   HCT 38.5 04/22/2019   MCV 85.2 04/22/2019     PLT 258 04/22/2019   NEUTROABS 3,277 04/22/2019      Assessment Plan:  DVT (deep venous thrombosis) (HCC) First episode of unprovoked blood clot November 2020 (popliteal vein) Current treatment: Xarelto  Inherited risk factorsinclude:Blood work has been sent for 1. Factor V Leiden mutation: No mutation 2. Prothrombin gene G20210A: No mutation 3. Protein S deficiency: Normal  4. Protein C deficiency : Normal 5. Antithrombin deficiency: Normal 6. Factor 8: 230% (slightly elevated)  Patient's risk factorsinclude: 1. 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 Tobacco use: Patient discontinued smoking November 2020 3. Obesity: BMI 36.5 4. Sedentary behavior including postoperative state: Patient's job is completely sedentary  Recommendation: Repeat testing 08/26/2019: Consistent with the presence of lupus anticoagulant Recommendations anticoagulation for life.  Leg cramps and discomfort: I instructed her to call us back so that we can schedule her for vascular ultrasound of her both lower extremities.  She tells me that because of her work she is unable to come this week but she will inform us through my chart message so that we can schedule her for ultrasound.  Return to clinic in 1 year for follow-up  [name] how are you doing working all right so well today you have a virtual visit to go over your lab results years you know in June you had a blood work  you probably saw the results of the lupus anticoagulant test okay  I discussed the assessment and treatment plan with the patient. The patient was provided an opportunity to ask questions and all were answered. The patient agreed with the plan and demonstrated an understanding of the instructions. The patient was advised to call back or seek an in-person evaluation if the symptoms worsen or if the condition fails to improve as anticipated.   I provided 20 minutes of face-to-face MyChart video visit time  during this encounter.    Rulon Eisenmenger, MD 10/07/2019   I, Molly Dorshimer, am acting as scribe for Nicholas Lose, MD.  I have reviewed the above documentation for accuracy and completeness, and I agree with the above.

## 2019-10-06 NOTE — Telephone Encounter (Signed)
Tried to reach patient to verify mychart visit on her cell phone for pre reg but it was full, so I called and left a message on her work number

## 2019-10-07 ENCOUNTER — Inpatient Hospital Stay: Payer: 59 | Attending: Hematology and Oncology | Admitting: Hematology and Oncology

## 2019-10-07 DIAGNOSIS — D6862 Lupus anticoagulant syndrome: Secondary | ICD-10-CM | POA: Insufficient documentation

## 2019-10-07 DIAGNOSIS — E669 Obesity, unspecified: Secondary | ICD-10-CM | POA: Insufficient documentation

## 2019-10-07 DIAGNOSIS — Z7901 Long term (current) use of anticoagulants: Secondary | ICD-10-CM | POA: Insufficient documentation

## 2019-10-07 DIAGNOSIS — Z86718 Personal history of other venous thrombosis and embolism: Secondary | ICD-10-CM | POA: Insufficient documentation

## 2019-10-07 DIAGNOSIS — I82432 Acute embolism and thrombosis of left popliteal vein: Secondary | ICD-10-CM | POA: Diagnosis not present

## 2019-10-07 DIAGNOSIS — Z6836 Body mass index (BMI) 36.0-36.9, adult: Secondary | ICD-10-CM | POA: Insufficient documentation

## 2019-10-07 NOTE — Assessment & Plan Note (Signed)
First episode of unprovoked blood clot November 2020 (popliteal vein) Current treatment: Xarelto  Inherited risk factorsinclude:Blood work has been sent for 1. Factor V Leiden mutation: No mutation 2. Prothrombin gene G20210A: No mutation 3. Protein S deficiency: Normal  4. Protein C deficiency : Normal 5. Antithrombin deficiency: Normal 6. Factor 8: 230% (slightly elevated)  Patient's risk factorsinclude: 1. 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 Tobacco use: Patient discontinued smoking November 2020 3. Obesity: BMI 36.5 4. Sedentary behavior including postoperative state: Patient's job is completely sedentary  Recommendation: Repeat testing 08/26/2019: Consistent with the presence of lupus anticoagulant Recommendations anticoagulation for life.  Return to clinic in 1 year for follow-up

## 2019-10-09 ENCOUNTER — Telehealth: Payer: Self-pay | Admitting: Hematology and Oncology

## 2019-10-09 NOTE — Telephone Encounter (Signed)
Scheduled per 7/20 los. Called to confirmed appt, pt stated that they would call back

## 2019-10-28 ENCOUNTER — Other Ambulatory Visit: Payer: Self-pay | Admitting: Family Medicine

## 2019-12-04 ENCOUNTER — Telehealth: Payer: Self-pay | Admitting: *Deleted

## 2019-12-04 NOTE — Telephone Encounter (Signed)
Received call from patient.   Reports that she continues to have increased cramps with her prior DVT.   Requested to extend use of Handicap Placard.   MD please advise.

## 2019-12-05 NOTE — Telephone Encounter (Signed)
Okay to extend another 6 months

## 2019-12-05 NOTE — Telephone Encounter (Signed)
Call placed to patient and patient made aware.  

## 2019-12-05 NOTE — Telephone Encounter (Signed)
Form completed.   Call placed to patient. No answer. No VM.

## 2019-12-09 ENCOUNTER — Ambulatory Visit: Payer: 59 | Admitting: Family Medicine

## 2019-12-09 ENCOUNTER — Other Ambulatory Visit: Payer: Self-pay

## 2019-12-09 ENCOUNTER — Encounter: Payer: Self-pay | Admitting: Family Medicine

## 2019-12-09 VITALS — BP 122/68 | HR 82 | Temp 97.9°F | Resp 16 | Ht 62.0 in | Wt 187.0 lb

## 2019-12-09 DIAGNOSIS — G8929 Other chronic pain: Secondary | ICD-10-CM | POA: Diagnosis not present

## 2019-12-09 DIAGNOSIS — M25511 Pain in right shoulder: Secondary | ICD-10-CM

## 2019-12-09 DIAGNOSIS — G43719 Chronic migraine without aura, intractable, without status migrainosus: Secondary | ICD-10-CM | POA: Diagnosis not present

## 2019-12-09 MED ORDER — PROMETHAZINE HCL 25 MG/ML IJ SOLN
12.5000 mg | Freq: Once | INTRAMUSCULAR | Status: AC
  Administered 2019-12-09: 12.5 mg via INTRAMUSCULAR

## 2019-12-09 MED ORDER — KETOROLAC TROMETHAMINE 60 MG/2ML IM SOLN
60.0000 mg | Freq: Once | INTRAMUSCULAR | Status: AC
  Administered 2019-12-09: 60 mg via INTRAMUSCULAR

## 2019-12-09 MED ORDER — HYDROCODONE-ACETAMINOPHEN 5-325 MG PO TABS: 1.0000 | ORAL_TABLET | Freq: Four times a day (QID) | ORAL | 0 refills | Status: DC | PRN

## 2019-12-09 NOTE — Progress Notes (Signed)
   Subjective:    Patient ID: Susan King, female    DOB: 1971-03-08, 49 y.o.   MRN: [id number]  Patient presents for Migraine (x2 days- unable to break)  Patient here with acute migraine.  States she has been taking her Topamax.  She did try her Roselyn Meier that did not help.  No change in vision but she has light sensitivity and phonophobia.  She has missed work the last 2 days.  She has not had any vomiting but has mild nausea. She missed her last appointment with her neurologist states it is schedule for October.  She is requesting abortive treatment today. NO uri SYMPTOMS, no fever, no sinusitis   She would also like referral to orthopedics his chronic right shoulder pain. Has had pain > 6 months  She does not recall any particular injury but has pain with range of motion.        Review Of Systems:  GEN- denies fatigue, fever, weight loss,weakness, recent illness HEENT- denies eye drainage, change in vision, nasal discharge, CVS- denies chest pain, palpitations RESP- denies SOB, cough, wheeze ABD- denies N/V, change in stools, abd pain GU- denies dysuria, hematuria, dribbling, incontinence MSK- + joint pain, muscle aches, injury Neuro- + headache, denies dizziness, syncope, seizure activity       Objective:    BP 122/68   Pulse 82   Temp 97.9 F (36.6 C) (Temporal)   Resp 16   Ht 5\' 2"  (1.575 m)   Wt 187 lb (84.8 kg)   LMP 09/07/2015 (Approximate)   SpO2 96%   BMI 34.20 kg/m  GEN- NAD, alert and oriented x3 HEENT- PERRL, EOMI, non injected sclera, pink conjunctiva,  Neck- Supple,  CVS- RRR, no murmur RESP-CTAB NEURO-CNII-XII in tact, no focal deficits EXT- No edema Pulses- Radial 2+        Assessment & Plan:      Problem List Items Addressed This Visit      Unprioritized   Migraines - Primary    Acute migraine, she has been treated in the office before Given toradol 60mg  , PHENERGAN 12.5mg  Continue all other meds Refilled norco F/u neurology    Referral to ortho for shoulder  Of note , she is not taking victoza declined labs today,states she is going to consider starting it again since she has the med at home       Relevant Medications   topiramate (TOPAMAX) 25 MG tablet   HYDROcodone-acetaminophen (NORCO/VICODIN) 5-325 MG tablet    Other Visit Diagnoses    Chronic right shoulder pain       Relevant Medications   topiramate (TOPAMAX) 25 MG tablet   HYDROcodone-acetaminophen (NORCO/VICODIN) 5-325 MG tablet   ketorolac (TORADOL) injection 60 mg (Completed)   Other Relevant Orders   Ambulatory referral to Orthopedic Surgery      Note: This dictation was prepared with Dragon dictation along with smaller phrase technology. Any transcriptional errors that result from this process are unintentional.

## 2019-12-09 NOTE — Patient Instructions (Signed)
Referral to orthopedics Schedule for CPE in Feb  F/u with your neurologist Toradol and phenergan given for migraine Drink lots of fluids

## 2019-12-09 NOTE — Assessment & Plan Note (Addendum)
Acute migraine, she has been treated in the office before Given toradol 60mg  , PHENERGAN 12.5mg  Continue all other meds Refilled norco F/u neurology   Referral to ortho for shoulder  Of note , she is not taking victoza declined labs today,states she is going to consider starting it again since she has the med at home

## 2020-01-09 ENCOUNTER — Other Ambulatory Visit: Payer: Self-pay

## 2020-01-09 ENCOUNTER — Emergency Department (HOSPITAL_COMMUNITY)
Admission: EM | Admit: 2020-01-09 | Discharge: 2020-01-09 | Disposition: A | Discharge status: Home / Self Care | Payer: 59 | Attending: Emergency Medicine | Admitting: Emergency Medicine

## 2020-01-09 ENCOUNTER — Emergency Department (HOSPITAL_COMMUNITY): Payer: 59

## 2020-01-09 DIAGNOSIS — Z794 Long term (current) use of insulin: Secondary | ICD-10-CM | POA: Diagnosis not present

## 2020-01-09 DIAGNOSIS — R110 Nausea: Secondary | ICD-10-CM | POA: Insufficient documentation

## 2020-01-09 DIAGNOSIS — R0789 Other chest pain: Secondary | ICD-10-CM | POA: Insufficient documentation

## 2020-01-09 DIAGNOSIS — Z79899 Other long term (current) drug therapy: Secondary | ICD-10-CM | POA: Diagnosis not present

## 2020-01-09 DIAGNOSIS — I10 Essential (primary) hypertension: Secondary | ICD-10-CM | POA: Insufficient documentation

## 2020-01-09 DIAGNOSIS — R079 Chest pain, unspecified: Secondary | ICD-10-CM | POA: Diagnosis present

## 2020-01-09 DIAGNOSIS — Z7901 Long term (current) use of anticoagulants: Secondary | ICD-10-CM | POA: Diagnosis not present

## 2020-01-09 DIAGNOSIS — Z87891 Personal history of nicotine dependence: Secondary | ICD-10-CM | POA: Insufficient documentation

## 2020-01-09 DIAGNOSIS — R42 Dizziness and giddiness: Secondary | ICD-10-CM | POA: Diagnosis not present

## 2020-01-09 LAB — BASIC METABOLIC PANEL
Anion gap: 8 (ref 5–15)
BUN: 10 mg/dL (ref 6–20)
CO2: 23 mmol/L (ref 22–32)
Calcium: 9.1 mg/dL (ref 8.9–10.3)
Chloride: 107 mmol/L (ref 98–111)
Creatinine, Ser: 0.91 mg/dL (ref 0.44–1.00)
GFR, Estimated: >60 mL/min (ref >60)
Glucose, Bld: 84 mg/dL (ref 70–99)
Potassium: 3.4 mmol/L — ABNORMAL LOW (ref 3.5–5.1)
Sodium: 138 mmol/L (ref 135–145)

## 2020-01-09 LAB — CBC WITH DIFFERENTIAL/PLATELET
Abs Immature Granulocytes: 0.01 10*3/uL (ref 0.00–0.07)
Basophils Absolute: 0.1 10*3/uL (ref 0.0–0.1)
Basophils Relative: 1 %
Eosinophils Absolute: 0.2 10*3/uL (ref 0.0–0.5)
Eosinophils Relative: 4 %
HCT: 40.2 % (ref 36.0–46.0)
Hemoglobin: 12.9 g/dL (ref 12.0–15.0)
Immature Granulocytes: 0 %
Lymphocytes Relative: 42 %
Lymphs Abs: 2.5 10*3/uL (ref 0.7–4.0)
MCH: 27.9 pg (ref 26.0–34.0)
MCHC: 32.1 g/dL (ref 30.0–36.0)
MCV: 87 fL (ref 80.0–100.0)
Monocytes Absolute: 0.5 10*3/uL (ref 0.1–1.0)
Monocytes Relative: 8 %
Neutro Abs: 2.7 10*3/uL (ref 1.7–7.7)
Neutrophils Relative %: 45 %
Platelets: 227 10*3/uL (ref 150–400)
RBC: 4.62 MIL/uL (ref 3.87–5.11)
RDW: 13 % (ref 11.5–15.5)
WBC: 6 10*3/uL (ref 4.0–10.5)
nRBC: 0 % (ref 0.0–0.2)

## 2020-01-09 LAB — I-STAT CHEM 8, ED
BUN: 11 mg/dL (ref 6–20)
Calcium, Ion: 1.18 mmol/L (ref 1.15–1.40)
Chloride: 106 mmol/L (ref 98–111)
Creatinine, Ser: 0.8 mg/dL (ref 0.44–1.00)
Glucose, Bld: 80 mg/dL (ref 70–99)
HCT: 39 % (ref 36.0–46.0)
Hemoglobin: 13.3 g/dL (ref 12.0–15.0)
Potassium: 3.2 mmol/L — ABNORMAL LOW (ref 3.5–5.1)
Sodium: 140 mmol/L (ref 135–145)
TCO2: 24 mmol/L (ref 22–32)

## 2020-01-09 LAB — TROPONIN I (HIGH SENSITIVITY)
Troponin I (High Sensitivity): 3 ng/L (ref <18)
Troponin I (High Sensitivity): 3 ng/L (ref <18)

## 2020-01-09 MED ORDER — IOHEXOL 350 MG/ML SOLN
100.0000 mL | Freq: Once | INTRAVENOUS | Status: AC | PRN
  Administered 2020-01-09: 100 mL via INTRAVENOUS

## 2020-01-09 MED ORDER — FENTANYL CITRATE (PF) 100 MCG/2ML IJ SOLN
50.0000 ug | Freq: Once | INTRAMUSCULAR | Status: AC
  Administered 2020-01-09: 50 ug via INTRAVENOUS
  Filled 2020-01-09: qty 2

## 2020-01-09 NOTE — ED Provider Notes (Signed)
[place] EMERGENCY DEPARTMENT Provider Note   CSN: [id number] Arrival date & time: 01/09/20  1750     History Chief Complaint  Patient presents with  . Chest Pain    Susan King is a 49 y.o. female.  HPI   49 year old female with a history of HSV, CVA, DVT, fibroids, generalized anxiety disorder, who presents to the emergency department today for evaluation of chest pain.  Patient states she was at work when she experienced sudden onset of central chest pain that started around 430.  She describes the pain as sharp.  States it radiates across her chest and down the left lower extremity.  She denies any associated shortness of breath or diaphoresis but does report some nausea and lightheadedness.  She does have some pleuritic pain.  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  She has been compliant with her Xarelto and denies any missed doses.  She does have a history of MI.  She was given nitroglycerin and aspirin in route.  States that her symptoms improved temporarily after the nitro but recurred and she now reports pain is "thousand "over 10.  Past Medical History:  Diagnosis Date  . Headache    Migraines  . HSV-2 (herpes simplex virus 2) infection   . Stroke [facility]) 2008   slight, no residual    Patient Active Problem List   Diagnosis Date Noted  . Precordial pain 04/25/2019  . Atypical chest pain 04/25/2019  . Essential hypertension 04/25/2019  . DVT (deep venous thrombosis) ([place]) 01/29/2019  . GAD (generalized anxiety disorder) 01/26/2017  . Migraines 07/18/2016  . Major depression, recurrent ([place]) 07/18/2016  . Fibroids, subserous 10/15/2015  . Uterus, adenomyosis 10/14/2015  . Herpes simplex type 2 infection 06/15/2015  . Obesity 06/17/2013  . Tobacco use disorder 06/17/2013  . History of CVA in adulthood 03/20/2006    Past Surgical History:  Procedure Laterality Date  . APPENDECTOMY    . LAPAROSCOPIC VAGINAL HYSTERECTOMY WITH  SALPINGECTOMY Bilateral 10/15/2015   Procedure: LAPAROSCOPIC ASSISTED VAGINAL HYSTERECTOMY WITH SALPINGECTOMY;  Surgeon: Eldred Manges, MD;  Location: [facility] [place] [facility];  Service: Gynecology;  Laterality: Bilateral;  3 hours     OB History   No obstetric history on file.     Family History  Problem Relation Age of Onset  . Heart disease Mother   . Diabetes Mother   . Hypertension Mother   . Cancer Sister        esophogeal    Social History   Tobacco Use  . Smoking status: Former Smoker    Packs/day: 0.15    Years: 30.00    Pack years: 4.50    Types: Cigarettes    Quit date: 01/23/2019    Years since quitting: 0.9  . Smokeless tobacco: Never Used  Vaping Use  . Vaping Use: Never used  Substance Use Topics  . Alcohol use: Yes    Comment: occas.  . Drug use: No    Home Medications Prior to Admission medications   Medication Sig Start Date End Date Taking? Authorizing Provider  DULoxetine (CYMBALTA) 30 MG capsule Take 30 mg by mouth daily.    [provider]  HYDROcodone-acetaminophen (NORCO/VICODIN) 5-325 MG tablet Take 1-2 tablets by mouth every 6 (six) hours as needed for severe pain. 12/09/19   [place], Modena Nunnery, MD  Insulin Pen Needle (BD PEN NEEDLE NANO U/F) 32G X 4 MM MISC Use as directed to inject Victoza SQ QD. 04/28/19  [place], Modena Nunnery, MD  liraglutide (VICTOZA) 18 MG/3ML SOPN Inject 0.1 mLs (0.6 mg total) into the skin daily. Titrate up q week by 0.6mg  until max dose 1.8mg  reached. 04/28/19   Alycia Rossetti, MD  metoprolol tartrate (LOPRESSOR) 25 MG tablet Take 1 tablet (25 mg total) by mouth 2 (two) times daily. 04/25/19 12/09/19  [place], Reynold Bowen, MD  topiramate (TOPAMAX) 25 MG tablet Take 25 mg by mouth 2 (two) times daily. 12/02/19   [provider]  topiramate (TOPAMAX) 50 MG tablet Take 50 mg by mouth 2 (two) times daily.  01/17/17   [provider]  traZODone (DESYREL) 50 MG tablet Take 50 mg by mouth at bedtime.    [provider]  Ubrogepant (UBRELVY) 100 MG TABS Take 100 mg by mouth as needed (For resolution of migraines).     [provider]  valACYclovir (VALTREX) 500 MG tablet Take 1 tablet (500 mg total) by mouth daily. 06/07/18   [place], Modena Nunnery, MD  XARELTO 20 MG TABS tablet TAKE 1 TABLET BY MOUTH EVERY DAY WITH SUPPER 10/28/19   Alycia Rossetti, MD    Allergies    Patient has no known allergies.  Review of Systems   Review of Systems  Constitutional: Negative for chills and fever.  HENT: Negative for ear pain and sore throat.   Eyes: Negative for visual disturbance.  Respiratory: Negative for cough and shortness of breath.   Cardiovascular: Positive for chest pain and leg swelling.  Gastrointestinal: Positive for nausea. Negative for abdominal pain, constipation, diarrhea and vomiting.  Genitourinary: Negative for dysuria and hematuria.  Musculoskeletal: Negative for back pain.  Skin: Negative for rash.  Neurological: Positive for light-headedness. Negative for headaches.  All other systems reviewed and are negative.   Physical Exam Updated Vital Signs BP 111/81   Pulse 88   Temp 98.1 F (36.7 C) (Oral)   Resp 16   Ht 5\' 2"  (1.575 m)   Wt 83.9 kg   LMP 09/07/2015 (Approximate)   SpO2 100%   BMI 33.84 kg/m   Physical Exam Vitals and nursing note reviewed.  Constitutional:      General: She is not in acute distress.    Appearance: She is well-developed.  HENT:     Head: Normocephalic and atraumatic.  Eyes:     Conjunctiva/sclera: Conjunctivae normal.  Cardiovascular:     Rate and Rhythm: Normal rate and regular rhythm.     Heart sounds: Normal heart sounds. No murmur heard.   Pulmonary:     Effort: Pulmonary effort is normal. No respiratory distress.     Breath sounds: Normal breath sounds. No decreased breath sounds, wheezing, rhonchi or rales.  Abdominal:     Palpations: Abdomen is soft.     Tenderness: There is no abdominal tenderness.  Musculoskeletal:      Cervical back: Neck supple.  Skin:    General: Skin is warm and dry.  Neurological:     Mental Status: She is alert.     ED Results / Procedures / Treatments   Labs (all labs ordered are listed, but only abnormal results are displayed) Labs Reviewed  BASIC METABOLIC PANEL - Abnormal; Notable for the following components:      Result Value   Potassium 3.4 (*)    All other components within normal limits  I-STAT CHEM 8, ED - Abnormal; Notable for the following components:   Potassium 3.2 (*)    All other components within normal limits  CBC WITH DIFFERENTIAL/PLATELET  TROPONIN I (HIGH SENSITIVITY)  TROPONIN I (HIGH SENSITIVITY)    EKG EKG Interpretation  Date/Time:  Friday January 09 2020 17:59:04 EDT Ventricular Rate:  90 PR Interval:    QRS Duration: 84 QT Interval:  364 QTC Calculation: 446 R Axis:   44 Text Interpretation: Sinus rhythm No significant change since last tracing Confirmed by Deno Etienne [phone]) on 01/09/2020 6:00:58 PM   Radiology CT Angio Chest PE W and/or Wo Contrast  Result Date: 01/09/2020 CLINICAL DATA:  Sudden chest pain EXAM: CT ANGIOGRAPHY CHEST WITH CONTRAST TECHNIQUE: Multidetector CT imaging of the chest was performed using the standard protocol during bolus administration of intravenous contrast. Multiplanar CT image reconstructions and MIPs were obtained to evaluate the vascular anatomy. CONTRAST:  150mL OMNIPAQUE IOHEXOL 350 MG/ML SOLN COMPARISON:  None. FINDINGS: Cardiovascular: There is a optimal opacification of the pulmonary arteries. There is no central,segmental, or subsegmental filling defects within the pulmonary arteries. The heart is normal in size. No pericardial effusion or thickening. No evidence right heart strain. There is normal three-vessel brachiocephalic anatomy without proximal stenosis. The thoracic aorta is normal in appearance. Mediastinum/Nodes: No hilar, mediastinal, or axillary adenopathy. Thyroid gland, trachea, and  esophagus demonstrate no significant findings. Lungs/Pleura: Minimal ground-glass opacities are seen at both lung bases. No large airspace consolidation or effusion. Upper Abdomen: No acute abnormalities present in the visualized portions of the upper abdomen. Musculoskeletal: No chest wall abnormality. No acute or significant osseous findings. There is a punctate calcifications seen in the upper pole of the right kidney. Review of the MIP images confirms the above findings. IMPRESSION: No central, segmental, or subsegmental pulmonary embolism. Minimal ground-glass opacity at both lung bases, likely atelectasis. Punctate right renal calculus Electronically Signed   By: Prudencio Pair M.D.   On: 01/09/2020 19:33    Procedures Procedures (including critical care time)  Medications Ordered in ED Medications  fentaNYL (SUBLIMAZE) injection 50 mcg (50 mcg Intravenous Given 01/09/20 1900)  iohexol (OMNIPAQUE) 350 MG/ML injection 100 mL (100 mLs Intravenous Contrast Given 01/09/20 1925)    ED Course  I have reviewed the triage vital signs and the nursing notes.  Pertinent labs & imaging results that were available during my care of the patient were reviewed by me and considered in my medical decision making (see chart for details).    MDM Rules/Calculators/A&P                          49 year old female presenting for evaluation of chest pain.  Currently has a DVT and is anticoagulated.  Has not missed any doses.  Reviewed/interpreted labs CBC is leukocytosis or anemia BMP with mild hypokalemia, otherwise reassuring Trops are negative  EKG with NSR, no significant change since last tracing  CTA chest reviewed/interpreted -  No central, segmental, or subsegmental pulmonary embolism. Minimal ground-glass opacity at both lung bases, likely atelectasis. Punctate right renal calculus   9:36 PM CONSULT with Dr. Marcelle Smiling who reviewed the patient's chart.  He feels that the patient is appropriate for  outpatient follow-up.  I also reviewed the patient's chart and I cannot find any record of the patient having a history of MI.  I see no history of an echo, cardiac cath.  Patient denies any history of getting stents.  She is not on a statin or any other medical therapy that would suggest she has a known history of CAD.  Her cardiologist note with Dr. Virgina Jock does  not mention any history of MI.  It seems that she is likely low risk of having ACS today.  I did recommend that she follow-up with her cardiologist this week for reevaluation return to the emergency department for new or worsening symptoms.  She voices understanding of the plan and reasons to return.  All questions answered.  Patient stable for discharge.   Final Clinical Impression(s) / ED Diagnoses Final diagnoses:  Atypical chest pain    Rx / DC Orders ED Discharge Orders    None       Rodney Booze, PA-C 01/09/20 2151    Deno Etienne, DO 01/09/20 2156

## 2020-01-09 NOTE — ED Notes (Signed)
Pt reports DVT in LLE currently compliant with xarelto

## 2020-01-09 NOTE — Discharge Instructions (Signed)
Please make an appoint with your cardiologist within the next week for reassessment and return to the emergency department for any new or worsening symptoms.

## 2020-01-09 NOTE — ED Triage Notes (Signed)
Pt complains of chest pain starting at 1630 while at work. Pt arrived via EMS.   Pt alert and oriented. Complaining of sharp chest pain 9/10. Complaining of sharp left arm pain that has resolved.   EMS gave 3 nitro and 324 aspirin.   Pt has hx of MI 2014.   VSS

## 2020-01-16 ENCOUNTER — Ambulatory Visit: Payer: 59 | Admitting: Orthopedic Surgery

## 2020-03-21 ENCOUNTER — Other Ambulatory Visit: Payer: Self-pay | Admitting: Family Medicine

## 2020-03-24 ENCOUNTER — Other Ambulatory Visit: Payer: Self-pay | Admitting: Family Medicine

## 2020-03-24 DIAGNOSIS — Z1231 Encounter for screening mammogram for malignant neoplasm of breast: Secondary | ICD-10-CM

## 2020-03-30 ENCOUNTER — Telehealth: Payer: Self-pay | Admitting: *Deleted

## 2020-03-30 NOTE — Telephone Encounter (Signed)
Received request from pharmacy for PA on Victoza,   PA submitted.   Dx: R73.09- borderline DM.    [facility] is reviewing your PA request. Typically an electronic response will be received within 72 hours. To check for an update later, open this request from your dashboard.  You may close this dialog and return to your dashboard to perform other tasks.

## 2020-03-30 NOTE — Telephone Encounter (Signed)
Received PA determination.   PA- [id number] approved through 03/30/2021.

## 2020-04-29 ENCOUNTER — Other Ambulatory Visit: Payer: Self-pay | Admitting: Family Medicine

## 2020-05-14 ENCOUNTER — Encounter: Payer: 59 | Admitting: Family Medicine

## 2020-07-02 ENCOUNTER — Encounter: Payer: 59 | Admitting: Family Medicine

## 2020-07-02 ENCOUNTER — Ambulatory Visit
Admission: RE | Admit: 2020-07-02 | Discharge: 2020-07-02 | Disposition: A | Discharge status: Home / Self Care | Payer: 59 | Source: Ambulatory Visit | Attending: Family Medicine | Admitting: Family Medicine

## 2020-07-02 ENCOUNTER — Other Ambulatory Visit: Payer: Self-pay

## 2020-07-02 DIAGNOSIS — Z1231 Encounter for screening mammogram for malignant neoplasm of breast: Secondary | ICD-10-CM

## 2020-07-06 ENCOUNTER — Other Ambulatory Visit: Payer: Self-pay | Admitting: Family Medicine

## 2020-07-06 DIAGNOSIS — R928 Other abnormal and inconclusive findings on diagnostic imaging of breast: Secondary | ICD-10-CM

## 2020-07-28 ENCOUNTER — Other Ambulatory Visit: Payer: 59

## 2020-08-25 ENCOUNTER — Other Ambulatory Visit: Payer: Self-pay | Admitting: Family Medicine

## 2020-08-26 ENCOUNTER — Telehealth (INDEPENDENT_AMBULATORY_CARE_PROVIDER_SITE_OTHER): Payer: 59 | Admitting: Nurse Practitioner

## 2020-08-26 ENCOUNTER — Encounter: Payer: Self-pay | Admitting: Nurse Practitioner

## 2020-08-26 ENCOUNTER — Ambulatory Visit: Payer: 59 | Admitting: Nurse Practitioner

## 2020-08-26 VITALS — Ht 62.5 in | Wt 192.0 lb

## 2020-08-26 DIAGNOSIS — U071 COVID-19: Secondary | ICD-10-CM

## 2020-08-26 DIAGNOSIS — J9801 Acute bronchospasm: Secondary | ICD-10-CM

## 2020-08-26 MED ORDER — PROMETHAZINE-DM 6.25-15 MG/5ML PO SYRP: 5.0000 mL | ORAL_SOLUTION | Freq: Three times a day (TID) | ORAL | 0 refills | Status: DC | PRN

## 2020-08-26 MED ORDER — ALBUTEROL SULFATE HFA 108 (90 BASE) MCG/ACT IN AERS: 1.0000 | INHALATION_SPRAY | Freq: Four times a day (QID) | RESPIRATORY_TRACT | 0 refills | Status: DC | PRN

## 2020-08-26 NOTE — Patient Instructions (Addendum)
You handicap form will be completed for 30month. You need a repeat venous US. Maintain upcoming appt with  Call office if no improvement in respiratory symptoms in the 1week

## 2020-08-26 NOTE — Progress Notes (Signed)
Virtual Visit via Video Note  I connected withNAME@ on 08/26/20 at  1:00 PM EDT by a video enabled telemedicine application and verified that I am speaking with the correct person using two identifiers.  Location: Patient:Home Provider: Office Participants: patient and provider  I discussed the limitations of evaluation and management by telemedicine and the availability of in person appointments. I also discussed with the patient that there may be a patient responsible charge related to this service. The patient expressed understanding and agreed to proceed.  SH:FWYOVZCH COVID test on 08/21/20. Pt c/o productive cough with clear mucus and chest congestion that she has been unable to get rid of. Pt has been taking   History of Present Illness: Cough This is a new problem. The current episode started 1 to 4 weeks ago. The problem has been unchanged. The problem occurs constantly. The cough is Productive of sputum. Associated symptoms include nasal congestion, postnasal drip and rhinorrhea. Pertinent negatives include no chest pain, chills, headaches, myalgias, sore throat or shortness of breath. The symptoms are aggravated by lying down. She has tried OTC cough suppressant for the symptoms. The treatment provided no relief. Her past medical history is significant for bronchitis. daily tobacco use     Observations/Objective: Physical Exam Constitutional:      General: She is not in acute distress. Pulmonary:     Effort: Pulmonary effort is normal. No respiratory distress.  Neurological:     Mental Status: She is alert and oriented to person, place, and time.    Assessment and Plan: Karys was seen today for acute visit.  Diagnoses and all orders for this visit:  COVID-19 -     promethazine-dextromethorphan (PROMETHAZINE-DM) 6.25-15 MG/5ML syrup; Take 5 mLs by mouth 3 (three) times daily as needed for cough. -     albuterol (VENTOLIN HFA) 108 (90 Base) MCG/ACT inhaler; Inhale 1-2 puffs  into the lungs every 6 (six) hours as needed for wheezing or shortness of breath.  Cough due to bronchospasm -     promethazine-dextromethorphan (PROMETHAZINE-DM) 6.25-15 MG/5ML syrup; Take 5 mLs by mouth 3 (three) times daily as needed for cough. -     albuterol (VENTOLIN HFA) 108 (90 Base) MCG/ACT inhaler; Inhale 1-2 puffs into the lungs every 6 (six) hours as needed for wheezing or shortness of breath.   Follow Up Instructions: Symptom management: Maintain adequate oral hydration Use promethazine Dm and albuterol for cough Quit tobacco use   I discussed the assessment and treatment plan with the patient. The patient was provided an opportunity to ask questions and all were answered. The patient agreed with the plan and demonstrated an understanding of the instructions.   The patient was advised to call back or seek an in-person evaluation if the symptoms worsen or if the condition fails to improve as anticipated.  Wilfred Lacy, NP

## 2020-08-27 ENCOUNTER — Other Ambulatory Visit: Payer: Self-pay | Admitting: Nurse Practitioner

## 2020-08-27 DIAGNOSIS — R928 Other abnormal and inconclusive findings on diagnostic imaging of breast: Secondary | ICD-10-CM

## 2020-08-30 ENCOUNTER — Other Ambulatory Visit: Payer: 59

## 2020-08-31 ENCOUNTER — Telehealth: Payer: Self-pay

## 2020-08-31 NOTE — Telephone Encounter (Signed)
Left message on patient's vmail to request call back; need to notify patient that Susan King is unable to take over her care at this time and to offer her a list of PCPs accepting new patients in her area.

## 2020-09-10 IMAGING — US US EXTREM LOW VENOUS*L*
1 series · 13 of 24 positions shown · non-contrast
Comparison: None.

CLINICAL DATA: Left calf pain and swelling.



[Series 1: us extrem low venous*left* · 13 of 39 slices shown]
[im 1/39]
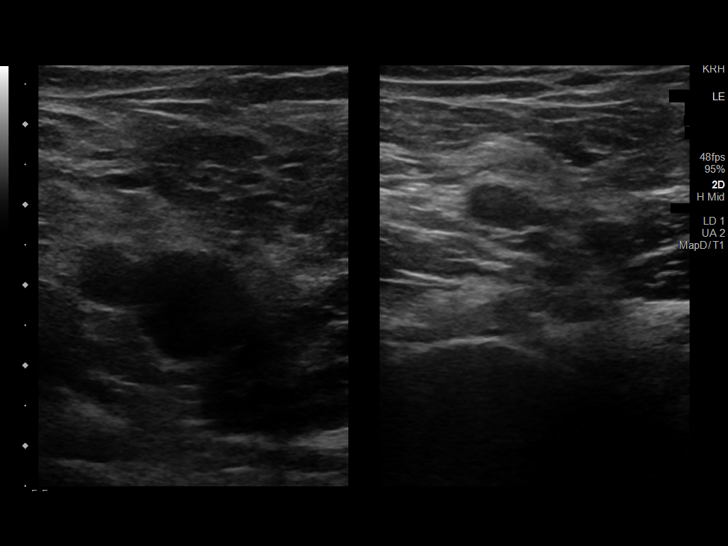
[im 4/39]
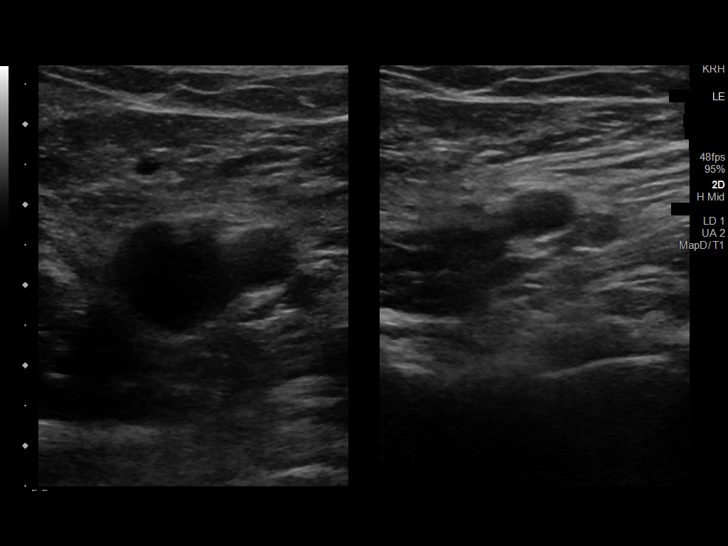
[im 7/39]
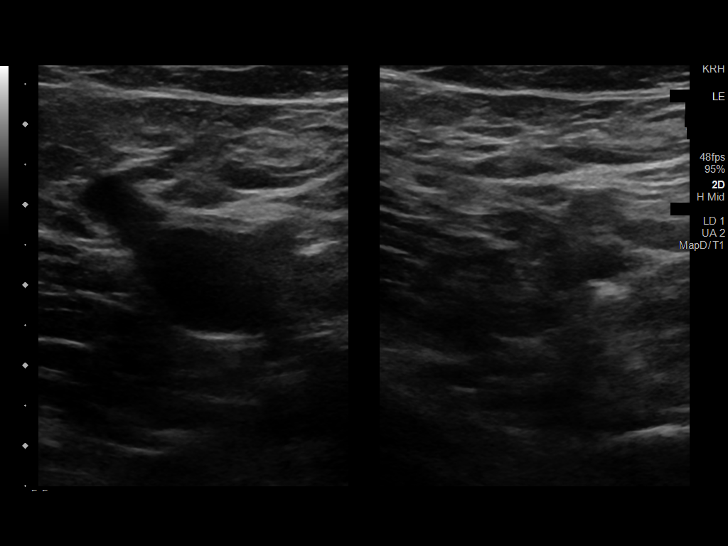
[im 10/39]
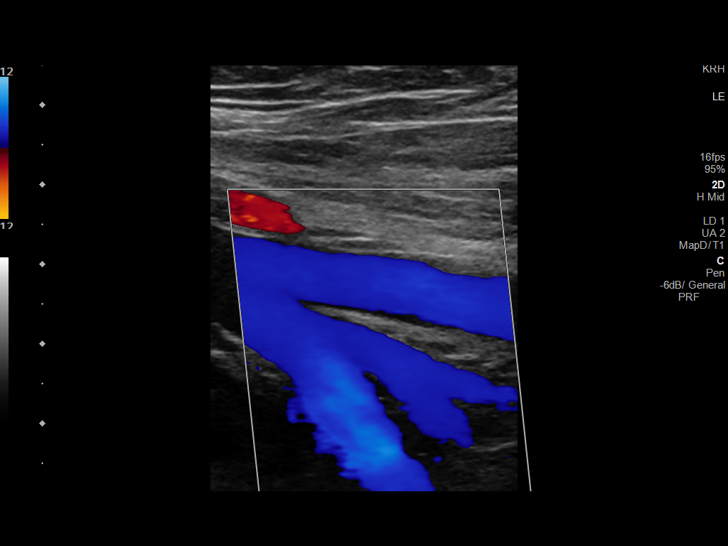
[im 14/39]
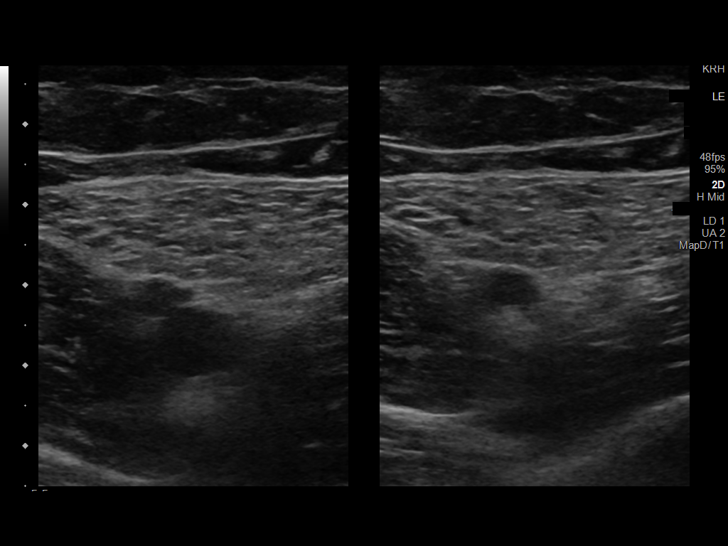
[im 17/39]
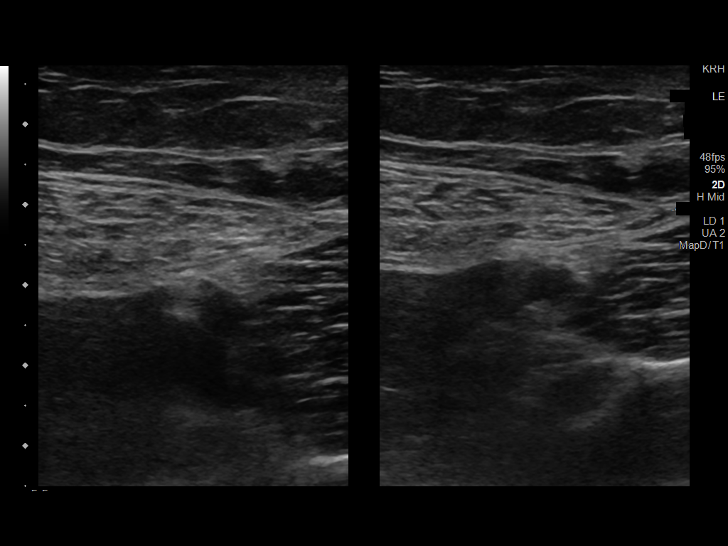
[im 20/39]
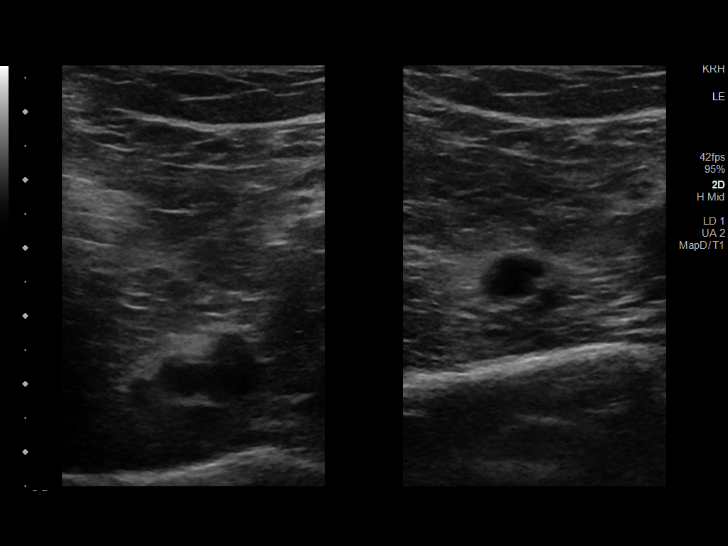
[im 22/39]
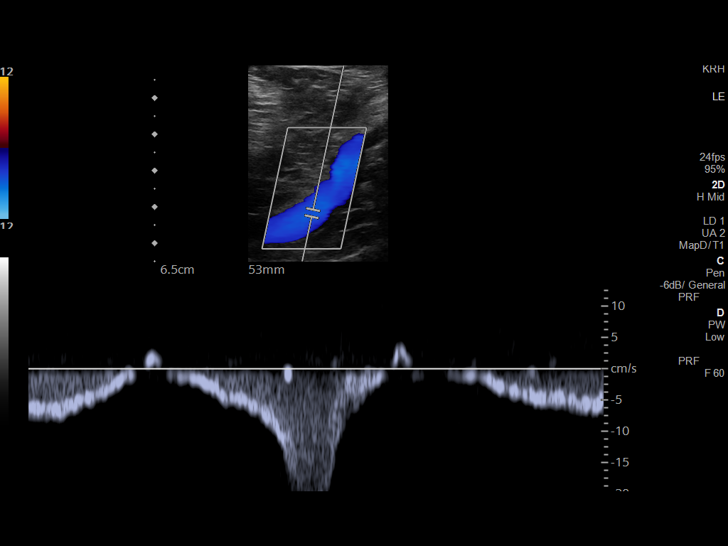
[im 25/39]
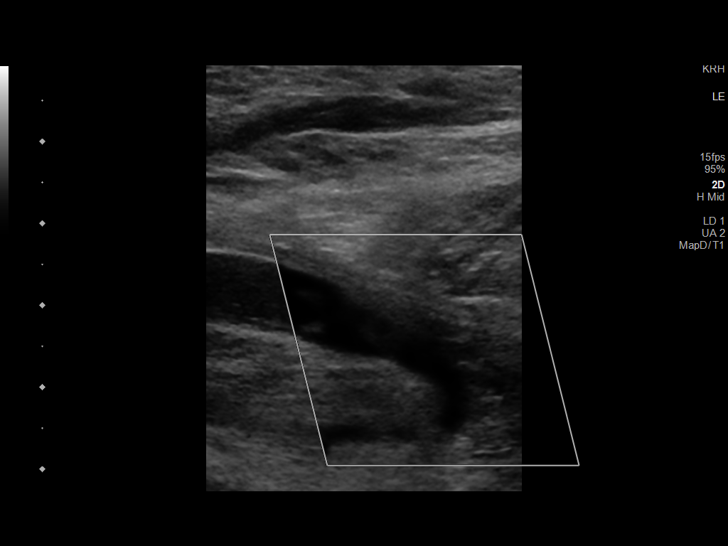
[im 29/39]
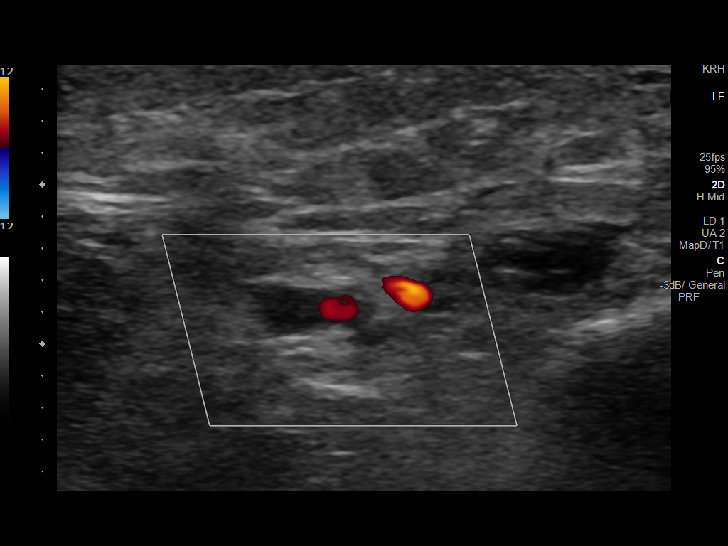
[im 32/39]
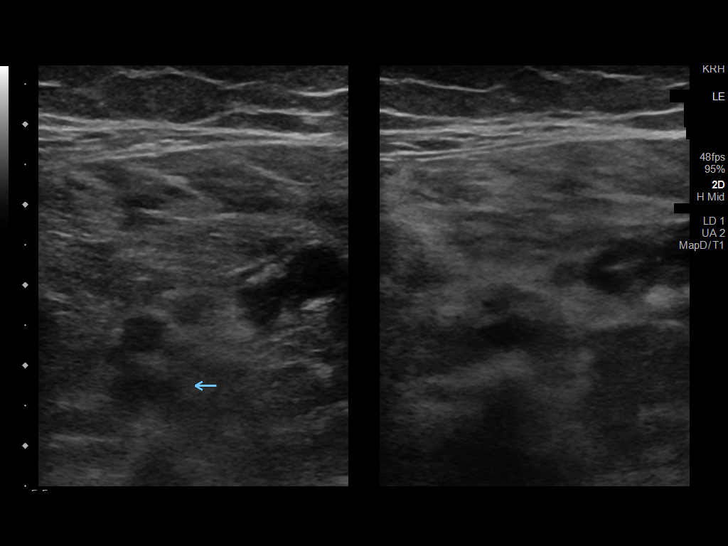
[im 35/39]
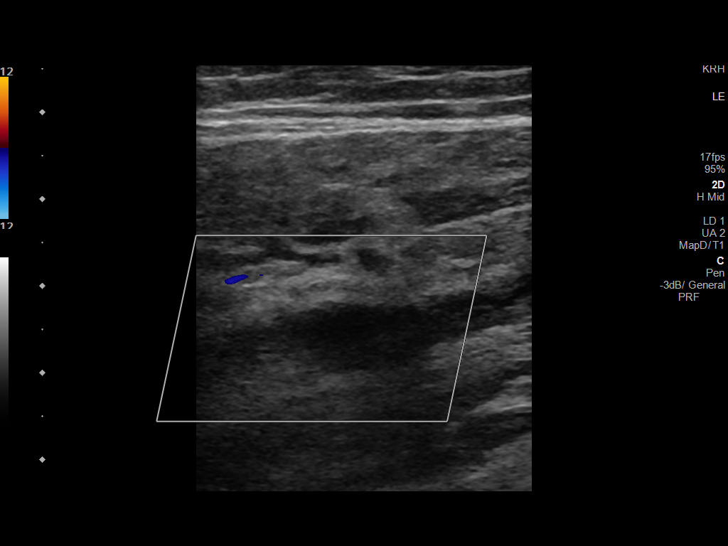
[im 39/39]
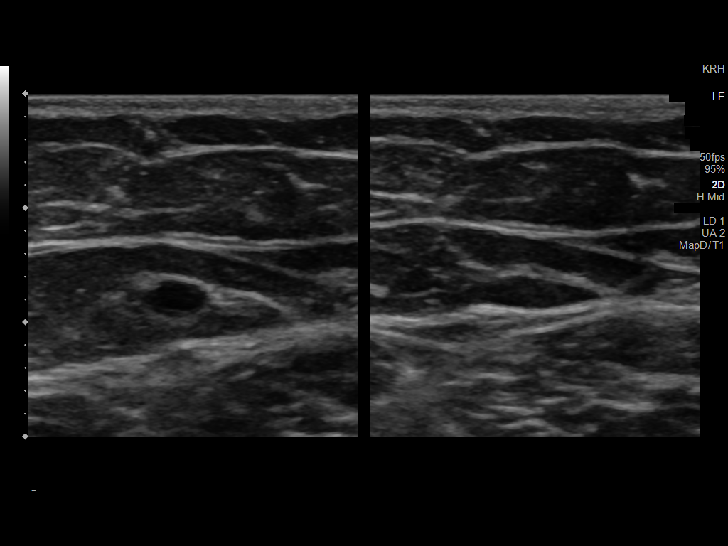

[13 of 24 positions shown; findings below may reference images not displayed]

FINDINGS: Contralateral Common Femoral Vein: Respiratory phasicity is normal
and symmetric with the symptomatic side. No evidence of thrombus.
Normal compressibility.

Common Femoral Vein: No evidence of thrombus. Normal
compressibility, respiratory phasicity and response to augmentation.

Saphenofemoral Junction: No evidence of thrombus. Normal
compressibility and flow on color Doppler imaging.

Profunda Femoral Vein: No evidence of thrombus. Normal
compressibility and flow on color Doppler imaging.

Femoral Vein: No evidence of thrombus. Normal compressibility,
respiratory phasicity and response to augmentation.

Popliteal Vein: Partial thrombus evident popliteal vein,
nonocclusive.

Calf Veins: Thrombus noted in the posterior tibial vein and
partially in the peroneal vein. There are paired peroneal veins, 1
occluded with thrombus. Both posterior tibial veins are occluded
with thrombus.

Superficial Great Saphenous Vein: No evidence of thrombus. Normal
compressibility.

Venous Reflux:  None.

Other Findings:  None.
IMPRESSION: 1. Deep venous thrombosis noted extending from the popliteal vein
into the posterior tibial and peroneal veins.
2. No evidence of thrombus in the common femoral vein, deep femoral
vein or femoral vein.

## 2020-09-13 IMAGING — DX DG CHEST 2V
2 series · 2 of 2 positions shown · non-contrast
Comparison: None.

CLINICAL DATA: Chest pain

EXAM:
CHEST - 2 VIEW

[chest lat]
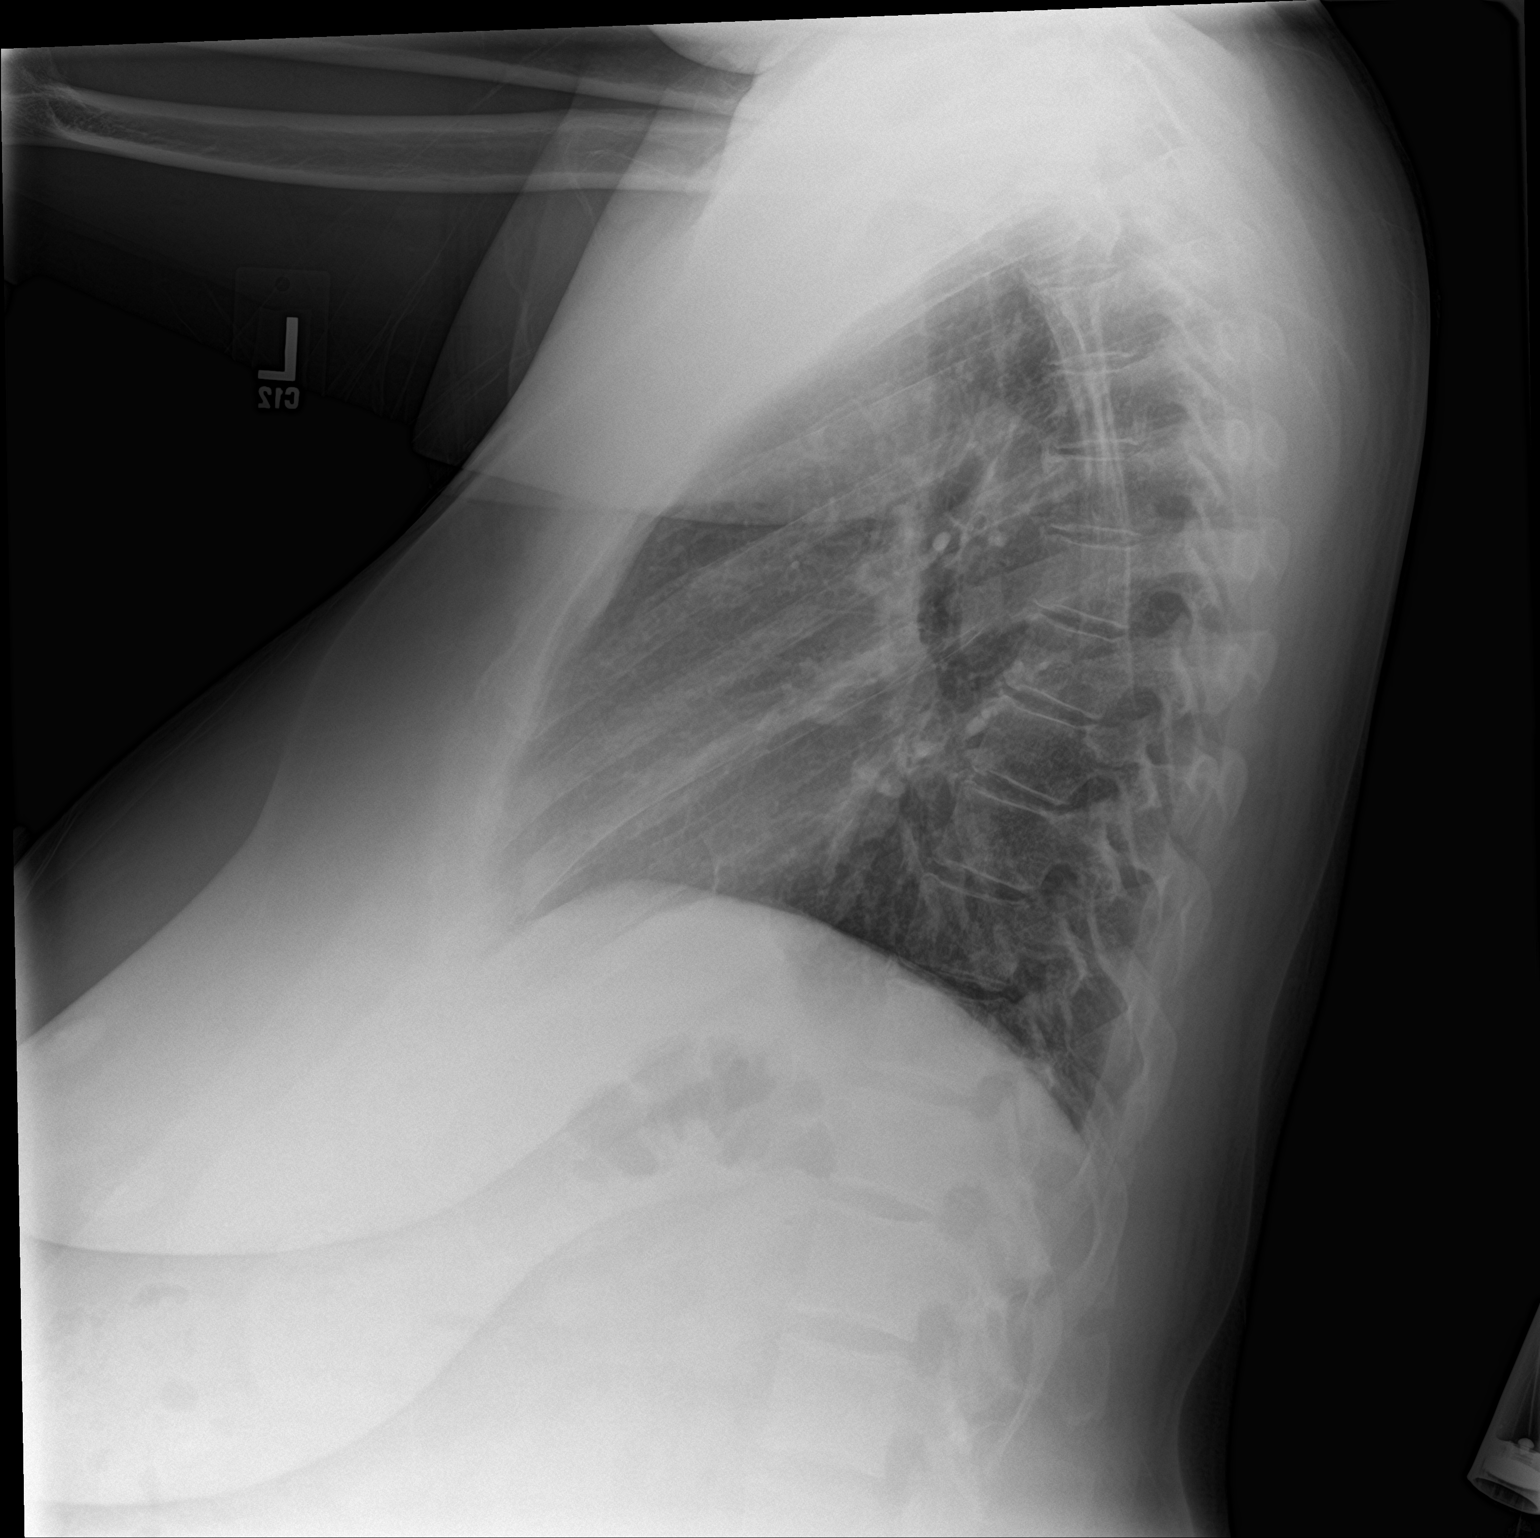

[chest ap]
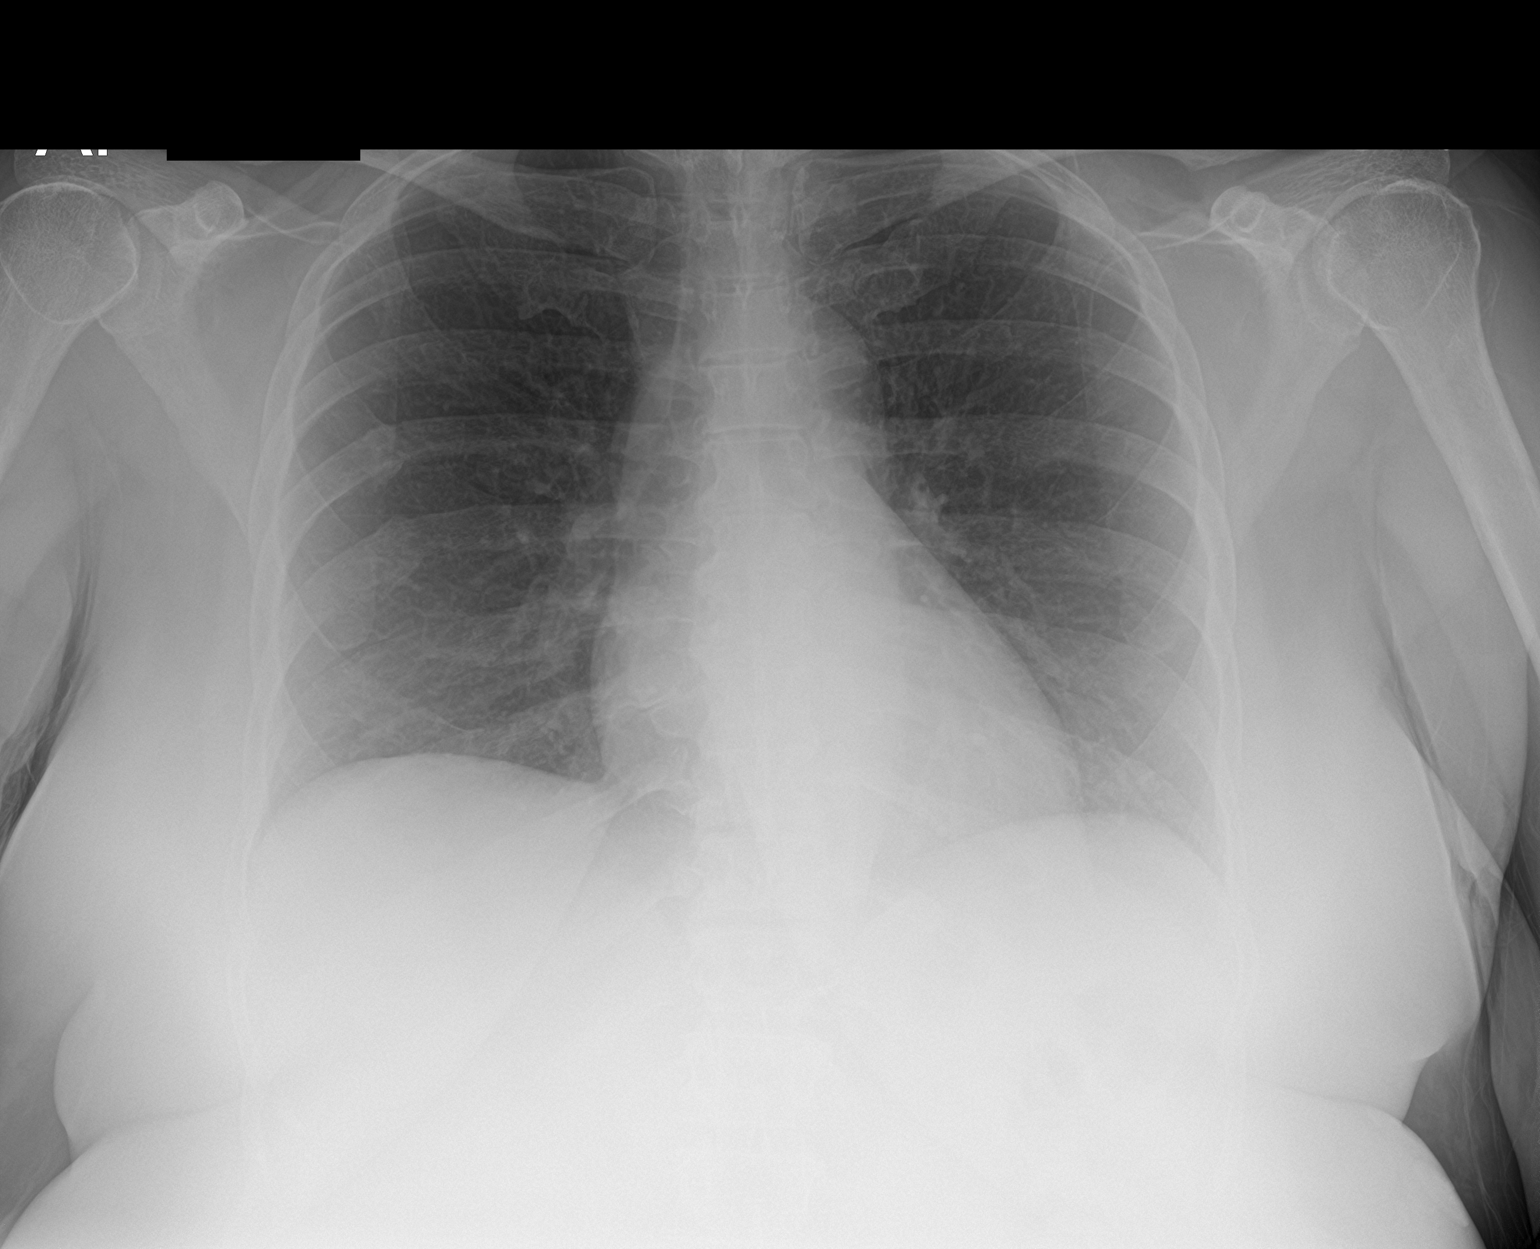

[2 of 2 positions shown; findings below may reference images not displayed]

FINDINGS: The heart size and mediastinal contours are within normal limits.
Both lungs are clear. No pleural effusion or pneumothorax. Chronic
posterior right rib fractures. The visualized skeletal structures
are otherwise unremarkable.
IMPRESSION: No active cardiopulmonary disease.

## 2020-10-05 NOTE — Progress Notes (Signed)
HEMATOLOGY-ONCOLOGY MYCHART VIDEO VISIT PROGRESS NOTE  I connected with Susan King on 10/06/2020 at  9:30 AM EDT by MyChart video conference and verified that I am speaking with the correct person using two identifiers.  I discussed the limitations, risks, security and privacy concerns of performing an evaluation and management service by MyChart and the availability of in person appointments.  I also discussed with the patient that there may be a patient responsible charge related to this service. The patient expressed understanding and agreed to proceed.     CHIEF COMPLIANT: Follow-up of DVT on Xarelto   INTERVAL HISTORY: Susan King is a 50 y.o. female with above-mentioned history of DVT currently on anticoagulation with Xarelto. She presents over MyChart today for follow-up.  She still has trouble walking long distances and is requesting a handicap placard today. She has not had any bruising or bleeding related to Xarelto.  Observations/Objective:  There were no vitals filed for this visit. There is no height or weight on file to calculate BMI.  I have reviewed the data as listed CMP Latest Ref Rng & Units 01/09/2020 01/09/2020 08/26/2019  Glucose 70 - 99 mg/dL 80 84 86  BUN 6 - 20 mg/dL 11 10 11   Creatinine 0.44 - 1.00 mg/dL 0.80 0.91 0.88  Sodium 135 - 145 mmol/L 140 138 139  Potassium 3.5 - 5.1 mmol/L 3.2(L) 3.4(L) 4.0  Chloride 98 - 111 mmol/L 106 107 107  CO2 22 - 32 mmol/L - 23 23  Calcium 8.9 - 10.3 mg/dL - 9.1 9.3  Total Protein 6.5 - 8.1 g/dL - - 7.5  Total Bilirubin 0.3 - 1.2 mg/dL - - 0.5  Alkaline Phos 38 - 126 U/L - - 93  AST 15 - 41 U/L - - 15  ALT 0 - 44 U/L - - 37    Lab Results  Component Value Date   WBC 6.0 01/09/2020   HGB 13.3 01/09/2020   HCT 39.0 01/09/2020   MCV 87.0 01/09/2020   PLT 227 01/09/2020   NEUTROABS 2.7 01/09/2020      Assessment Plan:  DVT (deep venous thrombosis) (HCC) First episode of unprovoked blood clot November 2020  (popliteal vein) Current treatment: Xarelto   Inherited risk factors include: Blood work has been sent for 1. Factor V Leiden mutation: No mutation 2. Prothrombin gene G20210A: No mutation 3. Protein S deficiency: Normal  4. Protein C deficiency : Normal 5. Antithrombin deficiency: Normal 6. Factor 8: 230% (slightly elevated)   Patient's risk factors include: 1. 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 Tobacco use: Patient discontinued smoking November 2020 3. Obesity: BMI 36.5 4. Sedentary behavior including postoperative state: Patient's job is completely sedentary     Repeat testing 08/26/2019: Consistent with the presence of lupus anticoagulant Recommendations anticoagulation for life.  Leg cramps: With difficulty walking long distances: I will send her a handicap placard for 6 months. Return to clinic in 1 year for follow-up    I discussed the assessment and treatment plan with the patient. The patient was provided an opportunity to ask questions and all were answered. The patient agreed with the plan and demonstrated an understanding of the instructions. The patient was advised to call back or seek an in-person evaluation if the symptoms worsen or if the condition fails to improve as anticipated.   Total time spent: 20 minutes including face-to-face MyChart video visit time and time spent for planning, charting and coordination of care  Rulon Eisenmenger, MD 10/06/2020  I, Thana Ates am acting as scribe for Nicholas Lose, MD.  I have reviewed the above documentation for accuracy and completeness, and I agree with the above.

## 2020-10-06 ENCOUNTER — Inpatient Hospital Stay: Payer: 59 | Attending: Hematology and Oncology | Admitting: Hematology and Oncology

## 2020-10-06 DIAGNOSIS — I82432 Acute embolism and thrombosis of left popliteal vein: Secondary | ICD-10-CM | POA: Diagnosis not present

## 2020-10-06 MED ORDER — RIVAROXABAN 20 MG PO TABS
ORAL_TABLET | ORAL | 3 refills | Status: DC
Start: 2020-10-06 — End: 2021-07-25

## 2020-10-06 NOTE — Assessment & Plan Note (Signed)
First episode of unprovoked blood clot November 2020 (popliteal vein) Current treatment:Xarelto  Inherited risk factorsinclude:Blood work has been sent for 1. Factor V Leiden mutation: No mutation 2. Prothrombin gene G20210A: No mutation 3. Protein S deficiency: Normal 4. Protein C deficiency: Normal 5. Antithrombin deficiency: Normal 6. Factor 8: 230% (slightly elevated)  Patient's risk factorsinclude: 1. Antiphospholipid antibody syndrome:Lupus Anticoagulant Positive 2. Tobacco use: Patient discontinued smoking November 2020 3. Obesity: BMI 36.5 4. Sedentary behavior including postoperative state: Patient's job is completely sedentary  Recommendation: Repeat testing 08/26/2019: Consistent with the presence of lupus anticoagulant Recommendations anticoagulation for life.  Leg cramps: Return to clinic in 1 year for follow-up

## 2020-10-06 NOTE — Progress Notes (Signed)
Handicap parking form completed by MD.   Mailed to patient per request.

## 2020-10-29 ENCOUNTER — Ambulatory Visit: Payer: 59 | Admitting: Nurse Practitioner

## 2020-11-11 ENCOUNTER — Ambulatory Visit: Payer: 59 | Admitting: Nurse Practitioner

## 2021-04-11 ENCOUNTER — Other Ambulatory Visit: Payer: Self-pay

## 2021-04-11 ENCOUNTER — Other Ambulatory Visit (HOSPITAL_COMMUNITY)
Admission: RE | Admit: 2021-04-11 | Discharge: 2021-04-11 | Disposition: A | Discharge status: Home / Self Care | Payer: 59 | Source: Ambulatory Visit | Attending: Nurse Practitioner | Admitting: Nurse Practitioner

## 2021-04-11 ENCOUNTER — Ambulatory Visit: Payer: 59 | Admitting: Nurse Practitioner

## 2021-04-11 ENCOUNTER — Encounter: Payer: Self-pay | Admitting: Nurse Practitioner

## 2021-04-11 VITALS — BP 130/70 | HR 94 | Temp 98.5°F | Ht 63.4 in | Wt 192.0 lb

## 2021-04-11 DIAGNOSIS — Z6833 Body mass index (BMI) 33.0-33.9, adult: Secondary | ICD-10-CM

## 2021-04-11 DIAGNOSIS — E7800 Pure hypercholesterolemia, unspecified: Secondary | ICD-10-CM

## 2021-04-11 DIAGNOSIS — Z1211 Encounter for screening for malignant neoplasm of colon: Secondary | ICD-10-CM

## 2021-04-11 DIAGNOSIS — Z1159 Encounter for screening for other viral diseases: Secondary | ICD-10-CM

## 2021-04-11 DIAGNOSIS — E6609 Other obesity due to excess calories: Secondary | ICD-10-CM

## 2021-04-11 DIAGNOSIS — N898 Other specified noninflammatory disorders of vagina: Secondary | ICD-10-CM | POA: Insufficient documentation

## 2021-04-11 DIAGNOSIS — R7303 Prediabetes: Secondary | ICD-10-CM

## 2021-04-11 DIAGNOSIS — F330 Major depressive disorder, recurrent, mild: Secondary | ICD-10-CM

## 2021-04-11 DIAGNOSIS — R5383 Other fatigue: Secondary | ICD-10-CM

## 2021-04-11 DIAGNOSIS — F17200 Nicotine dependence, unspecified, uncomplicated: Secondary | ICD-10-CM

## 2021-04-11 DIAGNOSIS — R197 Diarrhea, unspecified: Secondary | ICD-10-CM | POA: Diagnosis not present

## 2021-04-11 DIAGNOSIS — Z7689 Persons encountering health services in other specified circumstances: Secondary | ICD-10-CM

## 2021-04-11 DIAGNOSIS — Z86718 Personal history of other venous thrombosis and embolism: Secondary | ICD-10-CM

## 2021-04-11 MED ORDER — PROBIOTIC 250 MG PO CAPS: 1.0000 | ORAL_CAPSULE | Freq: Two times a day (BID) | ORAL | 2 refills | Status: DC

## 2021-04-11 MED ORDER — VICTOZA 18 MG/3ML ~~LOC~~ SOPN: PEN_INJECTOR | SUBCUTANEOUS | 1 refills | Status: DC

## 2021-04-11 MED ORDER — FLUCONAZOLE 100 MG PO TABS: ORAL_TABLET | ORAL | 0 refills | Status: DC

## 2021-04-11 NOTE — Progress Notes (Signed)
I,Tianna Badgett,acting as a Education administrator for [employer], FNP.,have documented all relevant documentation on the behalf of Minette Brine, FNP,as directed by  Minette Brine, FNP while in the presence of Minette Brine, [place].  This visit occurred during the SARS-CoV-2 public health emergency.  Safety protocols were in place, including screening questions prior to the visit, additional usage of staff PPE, and extensive cleaning of exam room while observing appropriate contact time as indicated for disinfecting solutions.  Subjective:     Patient ID: Susan King , female    DOB: 04-Apr-1970 , 51 y.o.   MRN: [id number]   Chief Complaint  Patient presents with   Establish Care    HPI  Patient is here to establish care. She was referred by her friend E.C. who is a patient of Dr. Baird Cancer. She was seeing Dr. Buelah Manis - [facility] medicine, last seen in 2022.  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 Divorced. She has a 57 y/o daughter and 61 y/o son. Both are healthy.    PMH - migraines - would get a medication IM when she had her flares. Her neurologist is in [place] ([facility] in [place]). She gets her medications for migraines and depression from them.  She has pain from a left lower extremity DVT - she has an appt with them in July 2023. She reports the DVT is still present. Her DVT has been present for 2 years. She also had a stroke several years ago and reports was due to stress. Reported her blood is too thick. Reports having pain when she walks long distances. Has the furniture market coming up and will have to park further away and is requesting a handicap placard.   Reports she fell off a ladder and hit her head that caused the migraines then after being treated off and on with medications she had depression. She has seen a psychologist during that time, not currently on counseling treatment.   She would like to discuss issues with vaginal irritation and diarrhea.  She reports she  has not had a history of hypertension that is in her chart.     Past Medical History:  Diagnosis Date   Headache    Migraines   HSV-2 (herpes simplex virus 2) infection    Stroke ([place]) 2008   slight, no residual     Family History  Problem Relation Age of Onset   Heart disease Mother    Diabetes Mother    Hypertension Mother    Heart disease Father    Anemia Father    Cancer Sister        esophogeal   Heart disease Maternal Grandfather      Current Outpatient Medications:    albuterol (VENTOLIN HFA) 108 (90 Base) MCG/ACT inhaler, Inhale 1-2 puffs into the lungs every 6 (six) hours as needed for wheezing or shortness of breath., Disp: 8 g, Rfl: 0   DULoxetine (CYMBALTA) 30 MG capsule, Take 30 mg by mouth daily., Disp: , Rfl:    fluconazole (DIFLUCAN) 100 MG tablet, Take one tablet by mouth now then repeat in 5 days., Disp: 2 tablet, Rfl: 0   Insulin Pen Needle (BD PEN NEEDLE NANO U/F) 32G X 4 MM MISC, Use as directed to inject Victoza SQ QD., Disp: 100 each, Rfl: 1   Saccharomyces boulardii (PROBIOTIC) 250 MG CAPS, Take 1 capsule by mouth 2 (two) times daily., Disp: 60 capsule, Rfl: 2   traZODone (DESYREL) 50 MG tablet, Take 50 mg  by mouth at bedtime., Disp: , Rfl:    Ubrogepant (UBRELVY) 100 MG TABS, Take 100 mg by mouth as needed (For resolution of migraines). , Disp: , Rfl:    valACYclovir (VALTREX) 500 MG tablet, Take 1 tablet (500 mg total) by mouth daily., Disp: 30 tablet, Rfl: 2   liraglutide (VICTOZA) 18 MG/3ML SOPN, INJECT 1.8 MG UNDER THE SKIN ONCE DAILY, Disp: 9 mL, Rfl: 1   rivaroxaban (XARELTO) 20 MG TABS tablet, TAKE 1 TABLET BY MOUTH EVERY DAY WITH SUPPER, Disp: 90 tablet, Rfl: 3   topiramate (TOPAMAX) 25 MG tablet, Take 100 mg by mouth 2 (two) times daily., Disp: , Rfl:    No Known Allergies   Review of Systems  Constitutional: Negative.   Cardiovascular: Negative.   Gastrointestinal:  Positive for diarrhea (since December - started with being constipated.  Was pencil type then turned into straight diarrhea. she had lost weght with Victoza and stopped taking the medication but restarted in December. Has diarrhea daily. She does drink coke Zero.).  Genitourinary:  Positive for vaginal pain (right upper labia). Negative for dysuria, frequency, vaginal bleeding and vaginal discharge.       Vaginal irritation to left side of labia majora - was "like a ball", she takes valtrex but has not taken as this typically affects her wall of vagina.   Musculoskeletal:        Reports pain to left lower leg.   Neurological:  Negative for dizziness and headaches.  Psychiatric/Behavioral: Negative.      Today's Vitals   04/11/21 0909  BP: 130/70  Pulse: 94  Temp: 98.5 F (36.9 C)  TempSrc: Oral  Weight: 192 lb (87.1 kg)  Height: 5' 3.4" (1.61 m)   Body mass index is 33.58 kg/m.  Wt Readings from Last 3 Encounters:  04/11/21 192 lb (87.1 kg)  08/26/20 192 lb (87.1 kg)  01/09/20 185 lb (83.9 kg)    Objective:  Physical Exam Vitals reviewed.  Constitutional:      General: She is not in acute distress.    Appearance: Normal appearance. She is obese.  Cardiovascular:     Rate and Rhythm: Normal rate and regular rhythm.     Pulses: Normal pulses.     Heart sounds: Normal heart sounds. No murmur heard. Pulmonary:     Effort: Pulmonary effort is normal. No respiratory distress.     Breath sounds: Normal breath sounds. No wheezing.  Genitourinary:      Comments: She has tenderness to right labia majora and palpated a very small firm area (circular).  Skin:    General: Skin is warm and dry.     Capillary Refill: Capillary refill takes less than 2 seconds.  Neurological:     General: No focal deficit present.     Mental Status: She is alert and oriented to person, place, and time.     Cranial Nerves: No cranial nerve deficit.     Motor: No weakness.  Psychiatric:        Mood and Affect: Mood normal.        Behavior: Behavior normal.         Thought Content: Thought content normal.        Judgment: Judgment normal.        Assessment And Plan:     1. Diarrhea, unspecified type Comments: This is a new change in stool pattern, however does have diarrhea at time with Victoza - Saccharomyces boulardii (PROBIOTIC) 250 MG CAPS; Take 1  capsule by mouth 2 (two) times daily.  Dispense: 60 capsule; Refill: 2  2. Other fatigue Comments: Will check for any metabolic causes.  - Vitamin B12 - TSH  3. Vaginal discharge Comments: Thick white, vaginal discharge to vaginal area - fluconazole (DIFLUCAN) 100 MG tablet; Take one tablet by mouth now then repeat in 5 days.  Dispense: 2 tablet; Refill: 0  4. History of DVT (deep vein thrombosis) According to Dr. Lindi Adie notes had unprovoked DVT in 2020, positive for antiphospholipid antibody syndrome - lupus anticoagulant positive. He recommends anticoagulation for life.  5. Mild episode of recurrent major depressive disorder ([place]) Comments: No current depression according to her depression screen. She gets her medications filled by her Neurologist.   6. Prediabetes Comments: She prefers taking Victoza daily.  - CMP14+EGFR - liraglutide (VICTOZA) 18 MG/3ML SOPN; INJECT 1.8 MG UNDER THE SKIN ONCE DAILY  Dispense: 9 mL; Refill: 1  7. Elevated cholesterol Comments: She is not on any medications for her cholesterol. Will check fasting lipids today - CBC - Lipid panel  8. Tobacco use disorder Smoking cessation instruction/counseling given:  counseled patient on the dangers of tobacco use, advised patient to stop smoking, and reviewed strategies to maximize success  9. Encounter for hepatitis C screening test for low risk patient Will check Hepatitis C screening due to recent recommendations to screen all adults 18 years and older - Hepatitis C antibody  10. Class 1 obesity due to excess calories without serious comorbidity with body mass index (BMI) of 33.0 to 33.9 in  adult Chronic Discussed healthy diet and regular exercise options  Encouraged to exercise at least 150 minutes per week with 2 days of strength training  11. Establishing care with new doctor, encounter for    12. Encounter for screening colonoscopy According to USPTF Colorectal cancer Screening guidelines. Colonoscopy is recommended every 10 years, starting at age 86 years. Will refer to GI for colon cancer screening. - Ambulatory referral to Gastroenterology  Removed HTN diagnosis due to patient reporting no history of HTN and I am unable to find a note indicating this information    Patient was given opportunity to ask questions. Patient verbalized understanding of the plan and was able to repeat key elements of the plan. All questions were answered to their satisfaction.  Minette Brine, FNP   I, Minette Brine, FNP, have reviewed all documentation for this visit. The documentation on 04/11/21 for the exam, diagnosis, procedures, and orders are all accurate and complete.   IF YOU HAVE BEEN REFERRED TO A SPECIALIST, IT MAY TAKE 1-2 WEEKS TO SCHEDULE/PROCESS THE REFERRAL. IF YOU HAVE NOT HEARD FROM US/SPECIALIST IN TWO WEEKS, PLEASE GIVE [place] A CALL AT [phone].   THE PATIENT IS ENCOURAGED TO PRACTICE SOCIAL DISTANCING DUE TO THE COVID-19 PANDEMIC.

## 2021-04-11 NOTE — Patient Instructions (Signed)

## 2021-04-12 LAB — LIPID PANEL
Chol/HDL Ratio: 3.3 ratio (ref 0.0–4.4)
Cholesterol, Total: 187 mg/dL (ref 100–199)
HDL: 56 mg/dL (ref >39)
LDL Chol Calc (NIH): 110 mg/dL — ABNORMAL HIGH (ref 0–99)
Triglycerides: 118 mg/dL (ref 0–149)
VLDL Cholesterol Cal: 21 mg/dL (ref 5–40)

## 2021-04-12 LAB — CBC
Hematocrit: 39.3 % (ref 34.0–46.6)
Hemoglobin: 13.5 g/dL (ref 11.1–15.9)
MCH: 29.2 pg (ref 26.6–33.0)
MCHC: 34.4 g/dL (ref 31.5–35.7)
MCV: 85 fL (ref 79–97)
Platelets: 224 10*3/uL (ref 150–450)
RBC: 4.62 x10E6/uL (ref 3.77–5.28)
RDW: 13.1 % (ref 11.7–15.4)
WBC: 5.9 10*3/uL (ref 3.4–10.8)

## 2021-04-12 LAB — CMP14+EGFR
ALT: 18 IU/L (ref 0–32)
AST: 11 IU/L (ref 0–40)
Albumin/Globulin Ratio: 1.5 (ref 1.2–2.2)
Albumin: 4.3 g/dL (ref 3.8–4.9)
Alkaline Phosphatase: 88 IU/L (ref 44–121)
BUN/Creatinine Ratio: 13 (ref 9–23)
BUN: 12 mg/dL (ref 6–24)
Bilirubin Total: 0.2 mg/dL (ref 0.0–1.2)
CO2: 20 mmol/L (ref 20–29)
Calcium: 9.4 mg/dL (ref 8.7–10.2)
Chloride: 102 mmol/L (ref 96–106)
Creatinine, Ser: 0.89 mg/dL (ref 0.57–1.00)
Globulin, Total: 2.8 g/dL (ref 1.5–4.5)
Glucose: 85 mg/dL (ref 70–99)
Potassium: 4.1 mmol/L (ref 3.5–5.2)
Sodium: 137 mmol/L (ref 134–144)
Total Protein: 7.1 g/dL (ref 6.0–8.5)
eGFR: 78 mL/min/{1.73_m2} (ref >59)

## 2021-04-12 LAB — TSH: TSH: 1.01 u[IU]/mL (ref 0.450–4.500)

## 2021-04-12 LAB — HEPATITIS C ANTIBODY: Hep C Virus Ab: <0.1 s/co ratio (ref 0.0–0.9)

## 2021-04-12 LAB — VITAMIN B12: Vitamin B-12: 302 pg/mL (ref 232–1245)

## 2021-04-13 LAB — CERVICOVAGINAL ANCILLARY ONLY
Bacterial Vaginitis (gardnerella): POSITIVE — AB
Candida Glabrata: POSITIVE — AB
Candida Vaginitis: NEGATIVE
Chlamydia: NEGATIVE
Comment: NEGATIVE
Comment: NEGATIVE
Comment: NEGATIVE
Comment: NEGATIVE
Comment: NEGATIVE
Comment: NORMAL
Neisseria Gonorrhea: NEGATIVE
Trichomonas: NEGATIVE

## 2021-06-29 ENCOUNTER — Telehealth: Payer: Self-pay | Admitting: *Deleted

## 2021-06-29 NOTE — Telephone Encounter (Signed)
Received call from pt requesting updated disability parking form.  Pt states she continues to experience issues with ambulating far distances due to hx of DVT.  Updated form completed and placed that the front desk for pt to pick up.  Pt verbalized understanding and appreciative.   ?

## 2021-07-14 ENCOUNTER — Ambulatory Visit: Payer: 59 | Admitting: Nurse Practitioner

## 2021-07-19 ENCOUNTER — Encounter: Payer: 59 | Admitting: Nurse Practitioner

## 2021-07-23 ENCOUNTER — Other Ambulatory Visit: Payer: Self-pay

## 2021-07-23 ENCOUNTER — Encounter (HOSPITAL_BASED_OUTPATIENT_CLINIC_OR_DEPARTMENT_OTHER): Payer: Self-pay

## 2021-07-23 ENCOUNTER — Emergency Department (HOSPITAL_BASED_OUTPATIENT_CLINIC_OR_DEPARTMENT_OTHER): Payer: 59

## 2021-07-23 DIAGNOSIS — Z7901 Long term (current) use of anticoagulants: Secondary | ICD-10-CM | POA: Insufficient documentation

## 2021-07-23 DIAGNOSIS — M79662 Pain in left lower leg: Secondary | ICD-10-CM | POA: Diagnosis present

## 2021-07-23 DIAGNOSIS — R0989 Other specified symptoms and signs involving the circulatory and respiratory systems: Secondary | ICD-10-CM | POA: Insufficient documentation

## 2021-07-23 DIAGNOSIS — F17210 Nicotine dependence, cigarettes, uncomplicated: Secondary | ICD-10-CM | POA: Diagnosis not present

## 2021-07-23 LAB — CBC WITH DIFFERENTIAL/PLATELET
Abs Immature Granulocytes: 0.02 10*3/uL (ref 0.00–0.07)
Basophils Absolute: 0.1 10*3/uL (ref 0.0–0.1)
Basophils Relative: 1 %
Eosinophils Absolute: 0.3 10*3/uL (ref 0.0–0.5)
Eosinophils Relative: 4 %
HCT: 36.4 % (ref 36.0–46.0)
Hemoglobin: 11.9 g/dL — ABNORMAL LOW (ref 12.0–15.0)
Immature Granulocytes: 0 %
Lymphocytes Relative: 43 %
Lymphs Abs: 3.2 10*3/uL (ref 0.7–4.0)
MCH: 28.5 pg (ref 26.0–34.0)
MCHC: 32.7 g/dL (ref 30.0–36.0)
MCV: 87.1 fL (ref 80.0–100.0)
Monocytes Absolute: 0.7 10*3/uL (ref 0.1–1.0)
Monocytes Relative: 9 %
Neutro Abs: 3.2 10*3/uL (ref 1.7–7.7)
Neutrophils Relative %: 43 %
Platelets: 214 10*3/uL (ref 150–400)
RBC: 4.18 MIL/uL (ref 3.87–5.11)
RDW: 13.1 % (ref 11.5–15.5)
WBC: 7.5 10*3/uL (ref 4.0–10.5)
nRBC: 0 % (ref 0.0–0.2)

## 2021-07-23 LAB — BASIC METABOLIC PANEL
Anion gap: 4 — ABNORMAL LOW (ref 5–15)
BUN: 19 mg/dL (ref 6–20)
CO2: 22 mmol/L (ref 22–32)
Calcium: 8.8 mg/dL — ABNORMAL LOW (ref 8.9–10.3)
Chloride: 109 mmol/L (ref 98–111)
Creatinine, Ser: 0.9 mg/dL (ref 0.44–1.00)
GFR, Estimated: >60 mL/min (ref >60)
Glucose, Bld: 103 mg/dL — ABNORMAL HIGH (ref 70–99)
Potassium: 3.4 mmol/L — ABNORMAL LOW (ref 3.5–5.1)
Sodium: 135 mmol/L (ref 135–145)

## 2021-07-23 LAB — TROPONIN I (HIGH SENSITIVITY): Troponin I (High Sensitivity): 2 ng/L (ref <18)

## 2021-07-23 NOTE — ED Triage Notes (Addendum)
Pt presents with a 1 day hx of L calf pain and swelling. Denies injury. Pt reports she is already on Xerelto for a previous DVT and took her last dose this AM. Pt also reports some chest discomfort.  ?

## 2021-07-24 ENCOUNTER — Emergency Department (HOSPITAL_BASED_OUTPATIENT_CLINIC_OR_DEPARTMENT_OTHER)
Admit: 2021-07-24 | Discharge: 2021-07-24 | Disposition: A | Discharge status: Home / Self Care | Payer: 59 | Attending: Emergency Medicine | Admitting: Emergency Medicine

## 2021-07-24 ENCOUNTER — Emergency Department (HOSPITAL_BASED_OUTPATIENT_CLINIC_OR_DEPARTMENT_OTHER)
Admission: EM | Admit: 2021-07-24 | Discharge: 2021-07-24 | Disposition: A | Discharge status: Home / Self Care | Payer: 59 | Attending: Emergency Medicine | Admitting: Emergency Medicine

## 2021-07-24 DIAGNOSIS — R0989 Other specified symptoms and signs involving the circulatory and respiratory systems: Secondary | ICD-10-CM

## 2021-07-24 HISTORY — DX: Acute embolism and thrombosis of unspecified deep veins of unspecified lower extremity: I82.409

## 2021-07-24 MED ORDER — ENOXAPARIN SODIUM 100 MG/ML IJ SOSY
90.0000 mg | PREFILLED_SYRINGE | Freq: Once | INTRAMUSCULAR | Status: AC
  Administered 2021-07-24: 90 mg via SUBCUTANEOUS
  Filled 2021-07-24: qty 1

## 2021-07-24 NOTE — ED Provider Notes (Signed)
? ?[facility] ?Provider Note: Georgena Spurling, MD, [place] ? ?CSN: [id number] ?MRN: [id number] ?ARRIVAL: 07/23/21 at 2234 ?ROOM: [facility] ? ? ?CHIEF COMPLAINT  ?Leg Pain ? ? ?HISTORY OF PRESENT ILLNESS  ?07/24/21 2:39 AM ?Susan King is a 51 y.o. female who is on Xarelto for previous deep vein thrombosis and has been compliant with her medication.  She is here with 2 days of left lower extremity pain and left foot swelling.  She denies injury.  She rates the pain in that calf is a 7 out of 10.  The pain is located primarily on her posteromedial calf as well as her medial thigh.  She had some chest discomfort on arrival which has resolved.  She is not sure if that was because she was anxious. ? ? ?Past Medical History:  ?Diagnosis Date  ? DVT (deep venous thrombosis) ([place])   ? Headache   ? Migraines  ? HSV-2 (herpes simplex virus 2) infection   ? Stroke ([place]) 03/20/2006  ? slight, no residual  ? ? ?Past Surgical History:  ?Procedure Laterality Date  ? APPENDECTOMY    ? LAPAROSCOPIC VAGINAL HYSTERECTOMY WITH SALPINGECTOMY Bilateral 10/15/2015  ? Procedure: LAPAROSCOPIC ASSISTED VAGINAL HYSTERECTOMY WITH SALPINGECTOMY;  Surgeon: Eldred Manges, MD;  Location: [facility];  Service: Gynecology;  Laterality: Bilateral;  3 hours  ? ? ?Family History  ?Problem Relation Age of Onset  ? Heart disease Mother   ? Diabetes Mother   ? Hypertension Mother   ? Heart disease Father   ? Anemia Father   ? Cancer Sister   ?     esophogeal  ? Heart disease Maternal Grandfather   ? ? ?Social History  ? ?Tobacco Use  ? Smoking status: Every Day  ?  Packs/day: 0.15  ?  Years: 30.00  ?  Pack years: 4.50  ?  Types: Cigarettes  ?  Last attempt to quit: 01/23/2019  ?  Years since quitting: 2.5  ? Smokeless tobacco: Never  ? Tobacco comments:  ?  She had quit for 6 months in 2020.  Had increased stress and started back.   ?Vaping Use  ? Vaping Use: Never used  ?Substance Use Topics  ? Alcohol use: Yes  ?  Comment: occas.  ? Drug use: No   ? ? ?Prior to Admission medications   ?Medication Sig Start Date End Date Taking? Authorizing Provider  ?albuterol (VENTOLIN HFA) 108 (90 Base) MCG/ACT inhaler Inhale 1-2 puffs into the lungs every 6 (six) hours as needed for wheezing or shortness of breath. 08/26/20   Nche, Charlene Brooke, NP  ?DULoxetine (CYMBALTA) 30 MG capsule Take 30 mg by mouth daily.    [provider]  ?fluconazole (DIFLUCAN) 100 MG tablet Take one tablet by mouth now then repeat in 5 days. 04/11/21   Minette Brine, [place]  ?Insulin Pen Needle (BD PEN NEEDLE NANO U/F) 32G X 4 MM MISC Use as directed to inject Victoza SQ QD. 04/28/19   Alycia Rossetti, MD  ?liraglutide (VICTOZA) 18 MG/3ML SOPN INJECT 1.8 MG UNDER THE SKIN ONCE DAILY 04/11/21   Minette Brine, FNP  ?rivaroxaban (XARELTO) 20 MG TABS tablet TAKE 1 TABLET BY MOUTH EVERY DAY WITH SUPPER 10/06/20   Nicholas Lose, MD  ?Saccharomyces boulardii (PROBIOTIC) 250 MG CAPS Take 1 capsule by mouth 2 (two) times daily. 04/11/21   Minette Brine, [place]  ?topiramate (TOPAMAX) 25 MG tablet Take 100 mg by mouth 2 (two) times daily. 12/02/19   [provider]  ?traZODone (DESYREL) 50 MG tablet Take 50 mg by mouth at bedtime.    [provider]  ?Ubrogepant (UBRELVY) 100 MG TABS Take 100 mg by mouth as needed (For resolution of migraines).     [provider]  ?valACYclovir (VALTREX) 500 MG tablet Take 1 tablet (500 mg total) by mouth daily. 06/07/18   Alycia Rossetti, MD  ? ? ?Allergies ?Patient has no known allergies. ? ? ?REVIEW OF SYSTEMS  ?Negative except as noted here or in the History of Present Illness. ? ? ?PHYSICAL EXAMINATION  ?Initial Vital Signs ?Blood pressure 117/81, pulse 83, resp. rate 18, height '5\' 2"'$  (1.575 m), weight 88.5 kg, last menstrual period 09/07/2015, SpO2 100 %. ? ?Examination ?General: Well-developed, well-nourished female in no acute distress; appearance consistent with age of record ?HENT: normocephalic; atraumatic ?Eyes: Normal  appearance ?Neck: supple ?Heart: regular rate and rhythm ?Lungs: clear to auscultation bilaterally ?Abdomen: soft; nondistended; nontender; bowel sounds present ?Extremities: No deformity; full range of motion; edema of dorsal left foot; tenderness of left posterolateral calf ?Neurologic: Awake, alert and oriented; motor function intact in all extremities and symmetric; no facial droop ?Skin: Warm and dry ?Psychiatric: Normal mood and affect ? ? ?RESULTS  ?Summary of this visit's results, reviewed and interpreted by myself: ? ? EKG Interpretation ? ?Date/Time:  Saturday Jul 23 2021 22:46:00 EDT ?Ventricular Rate:  92 ?PR Interval:  152 ?QRS Duration: 82 ?QT Interval:  346 ?QTC Calculation: 427 ?R Axis:   55 ?Text Interpretation: Normal sinus rhythm Normal ECG No significant change was found Confirmed by Beretta Ginsberg [phone]) on 07/23/2021 10:48:11 PM ?  ? ?  ? ?Laboratory Studies: ?Results for orders placed or performed during the hospital encounter of 07/24/21 (from the past 24 hour(s))  ?Basic metabolic panel     Status: Abnormal  ? Collection Time: 07/23/21 10:50 PM  ?Result Value Ref Range  ? Sodium 135 135 - 145 mmol/L  ? Potassium 3.4 (L) 3.5 - 5.1 mmol/L  ? Chloride 109 98 - 111 mmol/L  ? CO2 22 22 - 32 mmol/L  ? Glucose, Bld 103 (H) 70 - 99 mg/dL  ? BUN 19 6 - 20 mg/dL  ? Creatinine, Ser 0.90 0.44 - 1.00 mg/dL  ? Calcium 8.8 (L) 8.9 - 10.3 mg/dL  ? GFR, Estimated >60 >60 mL/min  ? Anion gap 4 (L) 5 - 15  ?Troponin I (High Sensitivity)     Status: None  ? Collection Time: 07/23/21 10:50 PM  ?Result Value Ref Range  ? Troponin I (High Sensitivity) 2 <18 ng/L  ?CBC with Differential     Status: Abnormal  ? Collection Time: 07/23/21 10:50 PM  ?Result Value Ref Range  ? WBC 7.5 4.0 - 10.5 K/uL  ? RBC 4.18 3.87 - 5.11 MIL/uL  ? Hemoglobin 11.9 (L) 12.0 - 15.0 g/dL  ? HCT 36.4 36.0 - 46.0 %  ? MCV 87.1 80.0 - 100.0 fL  ? MCH 28.5 26.0 - 34.0 pg  ? MCHC 32.7 30.0 - 36.0 g/dL  ? RDW 13.1 11.5 - 15.5 %  ? Platelets 214  150 - 400 K/uL  ? nRBC 0.0 0.0 - 0.2 %  ? Neutrophils Relative % 43 %  ? Neutro Abs 3.2 1.7 - 7.7 K/uL  ? Lymphocytes Relative 43 %  ? Lymphs Abs 3.2 0.7 - 4.0 K/uL  ? Monocytes Relative 9 %  ? Monocytes Absolute 0.7 0.1 - 1.0 K/uL  ? Eosinophils Relative 4 %  ?  Eosinophils Absolute 0.3 0.0 - 0.5 K/uL  ? Basophils Relative 1 %  ? Basophils Absolute 0.1 0.0 - 0.1 K/uL  ? Immature Granulocytes 0 %  ? Abs Immature Granulocytes 0.02 0.00 - 0.07 K/uL  ? ?Imaging Studies: ?DG Chest 2 View ? ?Result Date: 07/23/2021 ?CLINICAL DATA:  Chest discomfort.  Previous history of DVT. EXAM: CHEST - 2 VIEW COMPARISON:  January 22, 2019 FINDINGS: The heart size and mediastinal contours are within normal limits. Both lungs are clear. Chronic sixth and seventh right rib fractures are noted. IMPRESSION: No active cardiopulmonary disease. Electronically Signed   By: Virgina Norfolk M.D.   On: 07/23/2021 23:25   ? ?ED COURSE and MDM  ?Nursing notes, initial and subsequent vitals signs, including pulse oximetry, reviewed and interpreted by myself. ? ?Vitals:  ? 07/23/21 2241 07/24/21 0212 07/24/21 0230  ?BP:  123/80 117/81  ?Pulse:  87 83  ?Resp:  18 18  ?SpO2:  100% 100%  ?Weight: 88.5 kg    ?Height: '5\' 2"'$  (1.575 m)    ? ?Medications  ?enoxaparin (LOVENOX) injection 87.5 mg (has no administration in time range)  ? ?It is possible the patient's Xarelto is not adequately preventing recurrent thromboembolic disease.  We will give her a shot of Lovenox and have her return later today for Doppler ultrasound. ? ?PROCEDURES  ?Procedures ? ? ?ED DIAGNOSES  ? ?  ICD-10-CM   ?1. Suspected DVT (deep vein thrombosis)  R09.89   ?  ? ? ? ?  ?Shanon Rosser, MD ?07/24/21 0249 ? ?

## 2021-07-25 ENCOUNTER — Other Ambulatory Visit: Payer: Self-pay

## 2021-07-25 ENCOUNTER — Telehealth: Payer: Self-pay

## 2021-07-25 ENCOUNTER — Inpatient Hospital Stay: Payer: 59 | Attending: Hematology and Oncology | Admitting: Hematology and Oncology

## 2021-07-25 DIAGNOSIS — Z7901 Long term (current) use of anticoagulants: Secondary | ICD-10-CM | POA: Insufficient documentation

## 2021-07-25 DIAGNOSIS — Z87891 Personal history of nicotine dependence: Secondary | ICD-10-CM | POA: Insufficient documentation

## 2021-07-25 DIAGNOSIS — I82432 Acute embolism and thrombosis of left popliteal vein: Secondary | ICD-10-CM | POA: Diagnosis present

## 2021-07-25 DIAGNOSIS — D6861 Antiphospholipid syndrome: Secondary | ICD-10-CM | POA: Diagnosis not present

## 2021-07-25 DIAGNOSIS — Z79899 Other long term (current) drug therapy: Secondary | ICD-10-CM | POA: Insufficient documentation

## 2021-07-25 MED ORDER — RIVAROXABAN 20 MG PO TABS: ORAL_TABLET | ORAL | 3 refills | Status: DC

## 2021-07-25 NOTE — Progress Notes (Signed)
? ?Patient Care Team: ?Minette Brine, FNP as PCP - General (General Practice) ? ?DIAGNOSIS:  ?Encounter Diagnosis  ?Name Primary?  ? Acute deep vein thrombosis (DVT) of popliteal vein of left lower extremity (HCC)   ? ? ?CHIEF COMPLIANT: Follow-up after recent emergency room visit for left calf pain ? ?INTERVAL HISTORY: Susan King is a 51 year old above-mentioned history of popliteal vein DVT in the left lower extremity who is currently on Xarelto.  She was diagnosed with antiphospholipid antibody syndrome.  She presented with sudden onset of calf pain and upon further evaluation by ultrasound there was no evidence of DVT.  So she was discharged home to follow-up with [place]. ? ? ?ALLERGIES:  has No Known Allergies. ? ?MEDICATIONS:  ?Current Outpatient Medications  ?Medication Sig Dispense Refill  ? albuterol (VENTOLIN HFA) 108 (90 Base) MCG/ACT inhaler Inhale 1-2 puffs into the lungs every 6 (six) hours as needed for wheezing or shortness of breath. 8 g 0  ? DULoxetine (CYMBALTA) 30 MG capsule Take 30 mg by mouth daily.    ? Insulin Pen Needle (BD PEN NEEDLE NANO U/F) 32G X 4 MM MISC Use as directed to inject Victoza SQ QD. 100 each 1  ? liraglutide (VICTOZA) 18 MG/3ML SOPN INJECT 1.8 MG UNDER THE SKIN ONCE DAILY 9 mL 1  ? rivaroxaban (XARELTO) 20 MG TABS tablet TAKE 1 TABLET BY MOUTH EVERY DAY WITH SUPPER 90 tablet 3  ? Saccharomyces boulardii (PROBIOTIC) 250 MG CAPS Take 1 capsule by mouth 2 (two) times daily. 60 capsule 2  ? topiramate (TOPAMAX) 25 MG tablet Take 100 mg by mouth 2 (two) times daily.    ? traZODone (DESYREL) 50 MG tablet Take 50 mg by mouth at bedtime.    ? Ubrogepant (UBRELVY) 100 MG TABS Take 100 mg by mouth as needed (For resolution of migraines).     ? valACYclovir (VALTREX) 500 MG tablet Take 1 tablet (500 mg total) by mouth daily. 30 tablet 2  ? ?No current facility-administered medications for this visit.  ? ? ?PHYSICAL EXAMINATION: ?ECOG PERFORMANCE STATUS: 1 - Symptomatic but completely  ambulatory ? ?Vitals:  ? 07/25/21 1456  ?BP: 137/84  ?Pulse: 97  ?Resp: 17  ?Temp: 98.1 ?F (36.7 ?C)  ?SpO2: 100%  ? ?Filed Weights  ? 07/25/21 1456  ?Weight: 199 lb 11.2 oz (90.6 kg)  ? ?  ? ?LABORATORY DATA:  ?I have reviewed the data as listed ? ?  Latest Ref Rng & Units 07/23/2021  ? 10:50 PM 04/11/2021  ? 10:12 AM 01/09/2020  ?  6:34 PM  ?CMP  ?Glucose 70 - 99 mg/dL 103   85   80    ?BUN 6 - 20 mg/dL '19   12   11    '$ ?Creatinine 0.44 - 1.00 mg/dL 0.90   0.89   0.80    ?Sodium 135 - 145 mmol/L 135   137   140    ?Potassium 3.5 - 5.1 mmol/L 3.4   4.1   3.2    ?Chloride 98 - 111 mmol/L 109   102   106    ?CO2 22 - 32 mmol/L 22   20     ?Calcium 8.9 - 10.3 mg/dL 8.8   9.4     ?Total Protein 6.0 - 8.5 g/dL  7.1     ?Total Bilirubin 0.0 - 1.2 mg/dL  0.2     ?Alkaline Phos 44 - 121 IU/L  88     ?AST 0 - 40  IU/L  11     ?ALT 0 - 32 IU/L  18     ? ? ?Lab Results  ?Component Value Date  ? WBC 7.5 07/23/2021  ? HGB 11.9 (L) 07/23/2021  ? HCT 36.4 07/23/2021  ? MCV 87.1 07/23/2021  ? PLT 214 07/23/2021  ? NEUTROABS 3.2 07/23/2021  ? ? ?ASSESSMENT & PLAN:  ?DVT (deep venous thrombosis) ([place]) ?First episode of unprovoked blood clot November 2020 (popliteal vein) ?Current treatment: Xarelto ?  ?Patient's risk factors include: ?1. Antiphospholipid antibody syndrome: Lupus Anticoagulant Positive ?2. Tobacco use: Patient discontinued smoking November 2020 ?3. Obesity: BMI 36.5 ?4. Sedentary behavior including postoperative state: Patient's job is completely sedentary ?  ?  ?Repeat testing 08/26/2019: Consistent with the presence of lupus anticoagulant ?Recommendations anticoagulation for life. ? ?Emergency room visit 07/23/2021: Left lower extremity pain and swelling: Ultrasound: DVT negative ?I recommended some exercises for stretching of the calf muscle as well as putting some heat and applying Voltaren gel for the pain in the calf. ?Since there is no evidence of recurrent blood clot, we will continue with Xarelto. ?Because of chronic  DVTs and calf pain, she gets a handicap placard. ?Return to clinic in 1 year for MyChart virtual visit and follow-up ? ? ? ?No orders of the defined types were placed in this encounter. ? ?The patient has a good understanding of the overall plan. she agrees with it. she will call with any problems that may develop before the next visit here. ?Total time spent: 30 mins including face to face time and time spent for planning, charting and co-ordination of care ? ? Harriette Ohara, MD ?07/25/21 ? ? ? ?

## 2021-07-25 NOTE — Assessment & Plan Note (Addendum)
First episode of unprovoked blood clot November 2020 (popliteal vein) ?Current treatment:?Xarelto ?? ?Patient's?risk factors?include: ?1. Antiphospholipid antibody syndrome:?Lupus Anticoagulant Positive ?2. Tobacco use: Patient discontinued smoking November 2020 ?3. Obesity: BMI 36.5 ?4. Sedentary behavior including postoperative state: Patient's job is completely sedentary ?? ?  ?Repeat testing 08/26/2019: Consistent with the presence of lupus anticoagulant ?Recommendations anticoagulation for life. ? ?Emergency room visit 07/23/2021: Left lower extremity pain and swelling: Ultrasound: DVT negative ?I recommended some exercises for stretching of the calf muscle as well as putting some heat and applying Voltaren gel for the pain in the calf. ?Since there is no evidence of recurrent blood clot, we will continue with Xarelto. ? ?Return to clinic in 1 year for MyChart virtual visit and follow-up ? ?

## 2021-07-25 NOTE — Telephone Encounter (Signed)
Transition Care Management Unsuccessful Follow-up Telephone Call ? ?Date of discharge and from where:  07/23/2021 ? ?Attempts:  1st Attempt ? ?Reason for unsuccessful TCM follow-up call:  Voice mail full ? ?  ?

## 2021-10-07 ENCOUNTER — Telehealth: Payer: 59 | Admitting: Hematology and Oncology

## 2021-11-27 ENCOUNTER — Emergency Department (HOSPITAL_BASED_OUTPATIENT_CLINIC_OR_DEPARTMENT_OTHER)
Admission: EM | Admit: 2021-11-27 | Discharge: 2021-11-27 | Disposition: A | Discharge status: Home / Self Care | Payer: 59 | Attending: Emergency Medicine | Admitting: Emergency Medicine

## 2021-11-27 ENCOUNTER — Other Ambulatory Visit: Payer: Self-pay

## 2021-11-27 ENCOUNTER — Emergency Department (HOSPITAL_BASED_OUTPATIENT_CLINIC_OR_DEPARTMENT_OTHER): Payer: 59

## 2021-11-27 ENCOUNTER — Encounter (HOSPITAL_BASED_OUTPATIENT_CLINIC_OR_DEPARTMENT_OTHER): Payer: Self-pay | Admitting: Emergency Medicine

## 2021-11-27 DIAGNOSIS — G43009 Migraine without aura, not intractable, without status migrainosus: Secondary | ICD-10-CM | POA: Insufficient documentation

## 2021-11-27 DIAGNOSIS — Z7901 Long term (current) use of anticoagulants: Secondary | ICD-10-CM | POA: Insufficient documentation

## 2021-11-27 DIAGNOSIS — R519 Headache, unspecified: Secondary | ICD-10-CM | POA: Diagnosis present

## 2021-11-27 DIAGNOSIS — R000 Tachycardia, unspecified: Secondary | ICD-10-CM | POA: Diagnosis not present

## 2021-11-27 HISTORY — DX: Depression, unspecified: F32.A

## 2021-11-27 MED ORDER — KETOROLAC TROMETHAMINE 15 MG/ML IJ SOLN
15.0000 mg | Freq: Once | INTRAMUSCULAR | Status: AC
  Administered 2021-11-27: 15 mg via INTRAVENOUS
  Filled 2021-11-27: qty 1

## 2021-11-27 MED ORDER — DEXAMETHASONE SODIUM PHOSPHATE 10 MG/ML IJ SOLN
10.0000 mg | Freq: Once | INTRAMUSCULAR | Status: AC
Start: 2021-11-27 — End: 2021-11-27
  Administered 2021-11-27: 10 mg via INTRAVENOUS
  Filled 2021-11-27: qty 1

## 2021-11-27 MED ORDER — DIPHENHYDRAMINE HCL 50 MG/ML IJ SOLN
25.0000 mg | Freq: Once | INTRAMUSCULAR | Status: AC
Start: 2021-11-27 — End: 2021-11-27
  Administered 2021-11-27: 25 mg via INTRAVENOUS
  Filled 2021-11-27: qty 1

## 2021-11-27 MED ORDER — PROCHLORPERAZINE EDISYLATE 10 MG/2ML IJ SOLN
10.0000 mg | Freq: Once | INTRAMUSCULAR | Status: AC
  Administered 2021-11-27: 10 mg via INTRAVENOUS
  Filled 2021-11-27: qty 2

## 2021-11-27 MED ORDER — LACTATED RINGERS IV BOLUS
1000.0000 mL | Freq: Once | INTRAVENOUS | Status: AC
  Administered 2021-11-27: 1000 mL via INTRAVENOUS

## 2021-11-27 NOTE — ED Triage Notes (Signed)
Pt c/o headache x 1 week, after boxes hit her head. Pt has history of migraines.

## 2021-11-27 NOTE — ED Notes (Signed)
Pt called her mom to come and get her, eating crackers  , pt very sleepy

## 2021-11-27 NOTE — ED Notes (Signed)
Pt very sleepy and groggy , speech is slowed , needs a ride home , calling her family

## 2021-11-27 NOTE — Discharge Instructions (Signed)
I glad you are feeling better after your treatments today.  The steroid injection he received should continue to work for several days.  Please drink plenty of fluids and take your regular prescribed migraine medications as needed if symptoms return.  If your migraines become more frequent, follow-up with your neurologist

## 2021-11-27 NOTE — ED Provider Notes (Signed)
[facility] EMERGENCY DEPARTMENT Provider Note   CSN: [id number] Arrival date & time: 11/27/21  0907     History  Chief Complaint  Patient presents with   Headache    Susan King is a 51 y.o. female with history of DVT on Xarelto, history of migraines who presents to the emergency department for evaluation of intractable headache for 1 week after head injury.  Patient states that she was attempting to pull something out of the top shelf in her laundry room, when it knocked several boxes loose, causing detergent, bottles of cleaning solution, cleaning crystals and a box of miscellaneous items to fall directly onto her head.  No loss of consciousness.  Since then she has had severe, intractable headache.  She states pain seems to rotate to different parts of her head depending on how she moves.  She has tried all of her prescription migraine medications without any relief in symptoms.  She has also been taking Tylenol "like candy".  She denies neck pain, vision changes, nausea, vomiting or changes in mental status.   Headache Associated symptoms: no fever, no nausea, no numbness and no vomiting        Home Medications Prior to Admission medications   Medication Sig Start Date End Date Taking? Authorizing Provider  albuterol (VENTOLIN HFA) 108 (90 Base) MCG/ACT inhaler Inhale 1-2 puffs into the lungs every 6 (six) hours as needed for wheezing or shortness of breath. 08/26/20   Nche, Charlene Brooke, NP  DULoxetine (CYMBALTA) 30 MG capsule Take 30 mg by mouth daily.    [provider]  Insulin Pen Needle (BD PEN NEEDLE NANO U/F) 32G X 4 MM MISC Use as directed to inject Victoza SQ QD. 04/28/19   Alycia Rossetti, MD  liraglutide (VICTOZA) 18 MG/3ML SOPN INJECT 1.8 MG UNDER THE SKIN ONCE DAILY 04/11/21   Minette Brine, FNP  rivaroxaban (XARELTO) 20 MG TABS tablet TAKE 1 TABLET BY MOUTH EVERY DAY WITH SUPPER 07/25/21   Nicholas Lose, MD  Saccharomyces boulardii (PROBIOTIC) 250  MG CAPS Take 1 capsule by mouth 2 (two) times daily. 04/11/21   Minette Brine, FNP  topiramate (TOPAMAX) 25 MG tablet Take 100 mg by mouth 2 (two) times daily. 12/02/19   [provider]  traZODone (DESYREL) 50 MG tablet Take 50 mg by mouth at bedtime.    [provider]  Ubrogepant (UBRELVY) 100 MG TABS Take 100 mg by mouth as needed (For resolution of migraines).     [provider]  valACYclovir (VALTREX) 500 MG tablet Take 1 tablet (500 mg total) by mouth daily. 06/07/18   Alycia Rossetti, MD      Allergies    Patient has no known allergies.    Review of Systems   Review of Systems  Constitutional:  Negative for fever.  Eyes:  Negative for visual disturbance.  Gastrointestinal:  Negative for nausea and vomiting.  Neurological:  Positive for headaches. Negative for light-headedness and numbness.    Physical Exam Updated Vital Signs BP 105/79   Pulse (!) 101   Temp 98.3 F (36.8 C) (Oral)   Resp 16   Ht 5' 2.5" (1.588 m)   Wt 89.9 kg   LMP 09/07/2015 (Approximate)   SpO2 99%   BMI 35.69 kg/m  Physical Exam Vitals and nursing note reviewed.  Constitutional:      General: She is not in acute distress.    Appearance: She is not ill-appearing.  HENT:  Head: Atraumatic.     Comments: No bruising, contusions.  No battle sign or raccoon eyes Eyes:     Extraocular Movements: Extraocular movements intact.     Conjunctiva/sclera: Conjunctivae normal.     Pupils: Pupils are equal, round, and reactive to light.  Cardiovascular:     Rate and Rhythm: Normal rate and regular rhythm.     Pulses: Normal pulses.     Heart sounds: No murmur heard. Pulmonary:     Effort: Pulmonary effort is normal. No respiratory distress.     Breath sounds: Normal breath sounds.  Abdominal:     General: Abdomen is flat. There is no distension.     Palpations: Abdomen is soft.     Tenderness: There is no abdominal tenderness.  Musculoskeletal:        General: Normal  range of motion.     Cervical back: Normal range of motion.  Skin:    General: Skin is warm and dry.     Capillary Refill: Capillary refill takes less than 2 seconds.  Neurological:     General: No focal deficit present.     Mental Status: She is alert.     Comments: Cranial nerves III through XII intact. Strong and equal grip strength bilaterally Strong straight leg raise off table, intact strength on dorsiflexion and plantarflexion bilaterally Subjective sensation of the upper and lower extremities intact.  Psychiatric:        Mood and Affect: Mood normal.     ED Results / Procedures / Treatments   Labs (all labs ordered are listed, but only abnormal results are displayed) Labs Reviewed - No data to display  EKG None  Radiology CT Head Wo Contrast  Result Date: 11/27/2021 CLINICAL DATA:  Head trauma a week ago. On Xarelto. Intractable headache. EXAM: CT HEAD WITHOUT CONTRAST TECHNIQUE: Contiguous axial images were obtained from the base of the skull through the vertex without intravenous contrast. RADIATION DOSE REDUCTION: This exam was performed according to the departmental dose-optimization program which includes automated exposure control, adjustment of the mA and/or kV according to patient size and/or use of iterative reconstruction technique. COMPARISON:  CT head dated 03/02/2016. FINDINGS: Brain: Ventricles within normal limits. No mass, hemorrhage, edema or other evidence of acute parenchymal abnormality. No extra-axial hemorrhage. Vascular: Chronic calcified atherosclerotic changes of the large vessels at the skull base. No unexpected hyperdense vessel. Skull: Normal. Negative for fracture or focal lesion. Sinuses/Orbits: No acute findings. Chronic mucosal thickening within the sphenoid sinus. Other: Chronic scarring within the scalp overlying the posterior parietal-occipital bone. No acute-appearing edema or hematoma within the scalp. IMPRESSION: No acute findings. No  intracranial mass, hemorrhage or edema. No skull fracture. Electronically Signed   By: Franki Cabot M.D.   On: 11/27/2021 09:59    Procedures Procedures    Medications Ordered in ED Medications  lactated ringers bolus 1,000 mL (0 mLs Intravenous Stopped 11/27/21 1101)  prochlorperazine (COMPAZINE) injection 10 mg (10 mg Intravenous Given 11/27/21 1001)  diphenhydrAMINE (BENADRYL) injection 25 mg (25 mg Intravenous Given 11/27/21 1001)  ketorolac (TORADOL) 15 MG/ML injection 15 mg (15 mg Intravenous Given 11/27/21 1001)  dexamethasone (DECADRON) injection 10 mg (10 mg Intravenous Given 11/27/21 1101)    ED Course/ Medical Decision Making/ A&P Clinical Course as of 11/27/21 1135  Sun Nov 27, 2021  1056 On reassessment, headache only mildly improved. Will add Decadron '10mg'$  IV [EC]    Clinical Course User Index [EC] Tonye Pearson, PA-C  Medical Decision Making Amount and/or Complexity of Data Reviewed Radiology: ordered.  Risk Prescription drug management.   51 year old female presents emergency department for evaluation of intractable headache x1 week after traumatic injury while on thinners.  Differentials include migraine, tension headache, intracranial bleed, basilar skull fracture.  Vitals significant for mild tachycardia to 113.  Physical exam was overall benign.  Eyes PERRL.  No evidence of basilar skull fracture.  Neuro exam normal.  Given that she is on Xarelto, I did order head CT out of abundance of caution.  She was given 1 L LR bolus with Compazine 10 mg, Benadryl 25 mg and Toradol 15 mg IV.  On reassessment, patient was only feeling mildly better with the IV medications.  I added an Decadron 10 mg IV.  CT head was reassuring and without fractures or intracranial bleeding.  On reevaluation after Decadron, patient was feeling much better with near complete resolution of her headache.  Advised increase fluid intake and for her to take her migraine  medications as prescribed.  If headache returns or migraines become more frequent, she can follow-up with her neurologist.  Discharged home in stable condition.  Final Clinical Impression(s) / ED Diagnoses Final diagnoses:  Migraine without aura and without status migrainosus, not intractable    Rx / DC Orders ED Discharge Orders     None         Tonye Pearson, PA-C 11/27/21 1135    Tretha Sciara, MD 11/27/21 1317

## 2021-11-30 ENCOUNTER — Telehealth: Payer: Self-pay

## 2021-11-30 NOTE — Telephone Encounter (Signed)
Transition Care Management Unsuccessful Follow-up Telephone Call  Date of discharge and from where:  11/27/2021  Attempts:  1st Attempt  Reason for unsuccessful TCM follow-up call:  Voice mail full

## 2022-02-10 ENCOUNTER — Ambulatory Visit
Admission: EM | Admit: 2022-02-10 | Discharge: 2022-02-10 | Disposition: A | Discharge status: Home / Self Care | Payer: 59 | Attending: Urgent Care | Admitting: Urgent Care

## 2022-02-10 DIAGNOSIS — B9689 Other specified bacterial agents as the cause of diseases classified elsewhere: Secondary | ICD-10-CM

## 2022-02-10 DIAGNOSIS — N760 Acute vaginitis: Secondary | ICD-10-CM | POA: Insufficient documentation

## 2022-02-10 DIAGNOSIS — R350 Frequency of micturition: Secondary | ICD-10-CM | POA: Diagnosis not present

## 2022-02-10 DIAGNOSIS — N898 Other specified noninflammatory disorders of vagina: Secondary | ICD-10-CM | POA: Insufficient documentation

## 2022-02-10 LAB — POCT URINALYSIS DIP (MANUAL ENTRY)
Bilirubin, UA: NEGATIVE
Glucose, UA: NEGATIVE mg/dL
Ketones, POC UA: NEGATIVE mg/dL
Leukocytes, UA: NEGATIVE
Nitrite, UA: NEGATIVE
Protein Ur, POC: NEGATIVE mg/dL
Spec Grav, UA: 1.02 (ref 1.010–1.025)
Urobilinogen, UA: 0.2 E.U./dL
pH, UA: 6 (ref 5.0–8.0)

## 2022-02-10 MED ORDER — METRONIDAZOLE 500 MG PO TABS: 500.0000 mg | ORAL_TABLET | Freq: Two times a day (BID) | ORAL | 0 refills | Status: DC

## 2022-02-10 MED ORDER — FLUCONAZOLE 150 MG PO TABS: 150.0000 mg | ORAL_TABLET | ORAL | 0 refills | Status: DC

## 2022-02-10 NOTE — ED Notes (Signed)
In with Susan King, [place] for exam

## 2022-02-10 NOTE — ED Provider Notes (Signed)
[facility]  Note:  This document was prepared using Systems analyst and may include unintentional dictation errors.  MRN: [id number] DOB: 1970-09-19  Subjective:   Susan King is a 51 y.o. female presenting for 2-week history of acute onset persistent vaginal discharge, urinary frequency.  She also noticed a nontender, nondraining bump to the right inner labia of her vagina.  Has a history of genital herpes but does not feel that it is bothering her now.  Has not seen her gynecologist in a while. Denies fever, n/v, abdominal pain, pelvic pain, dysuria, urinary frequency, hematuria.  Patient has been doing a lot of cranberry juice and hydrating with water.  Has a history of BV and yeast infection.  Low concern for STI but is not opposed to testing.  No current facility-administered medications for this encounter.  Current Outpatient Medications:    albuterol (VENTOLIN HFA) 108 (90 Base) MCG/ACT inhaler, Inhale 1-2 puffs into the lungs every 6 (six) hours as needed for wheezing or shortness of breath., Disp: 8 g, Rfl: 0   DULoxetine (CYMBALTA) 30 MG capsule, Take 30 mg by mouth daily., Disp: , Rfl:    Insulin Pen Needle (BD PEN NEEDLE NANO U/F) 32G X 4 MM MISC, Use as directed to inject Victoza SQ QD., Disp: 100 each, Rfl: 1   liraglutide (VICTOZA) 18 MG/3ML SOPN, INJECT 1.8 MG UNDER THE SKIN ONCE DAILY, Disp: 9 mL, Rfl: 1   rivaroxaban (XARELTO) 20 MG TABS tablet, TAKE 1 TABLET BY MOUTH EVERY DAY WITH SUPPER, Disp: 90 tablet, Rfl: 3   Saccharomyces boulardii (PROBIOTIC) 250 MG CAPS, Take 1 capsule by mouth 2 (two) times daily., Disp: 60 capsule, Rfl: 2   topiramate (TOPAMAX) 25 MG tablet, Take 100 mg by mouth 2 (two) times daily., Disp: , Rfl:    traZODone (DESYREL) 50 MG tablet, Take 50 mg by mouth at bedtime., Disp: , Rfl:    Ubrogepant (UBRELVY) 100 MG TABS, Take 100 mg by mouth as needed (For resolution of migraines). , Disp: , Rfl:     valACYclovir (VALTREX) 500 MG tablet, Take 1 tablet (500 mg total) by mouth daily., Disp: 30 tablet, Rfl: 2   No Known Allergies  Past Medical History:  Diagnosis Date   Depression    DVT (deep venous thrombosis) (HCC)    Headache    Migraines   HSV-2 (herpes simplex virus 2) infection    Stroke ([place]) 03/20/2006   slight, no residual     Past Surgical History:  Procedure Laterality Date   APPENDECTOMY     LAPAROSCOPIC VAGINAL HYSTERECTOMY WITH SALPINGECTOMY Bilateral 10/15/2015   Procedure: LAPAROSCOPIC ASSISTED VAGINAL HYSTERECTOMY WITH SALPINGECTOMY;  Surgeon: Eldred Manges, MD;  Location: [facility];  Service: Gynecology;  Laterality: Bilateral;  3 hours    Family History  Problem Relation Age of Onset   Heart disease Mother    Diabetes Mother    Hypertension Mother    Heart disease Father    Anemia Father    Cancer Sister        esophogeal   Heart disease Maternal Grandfather     Social History   Tobacco Use   Smoking status: Every Day    Packs/day: 0.15    Years: 30.00    Total pack years: 4.50    Types: Cigarettes   Smokeless tobacco: Never  Vaping Use   Vaping Use: Never used  Substance Use Topics   Alcohol use: Yes  Comment: occ   Drug use: No    ROS   Objective:   Vitals: BP 132/86 (BP Location: Right Arm)   Pulse 98   Temp 98 F (36.7 C) (Oral)   Resp 18   LMP 09/07/2015 (Approximate)   SpO2 99%   Physical Exam Exam conducted with a chaperone present Scientist, product/process development).  Constitutional:      General: She is not in acute distress.    Appearance: Normal appearance. She is well-developed. She is not ill-appearing, toxic-appearing or diaphoretic.  HENT:     Head: Normocephalic and atraumatic.     Nose: Nose normal.     Mouth/Throat:     Mouth: Mucous membranes are moist.  Eyes:     General: No scleral icterus.       Right eye: No discharge.        Left eye: No discharge.     Extraocular Movements: Extraocular movements intact.   Cardiovascular:     Rate and Rhythm: Normal rate.  Pulmonary:     Effort: Pulmonary effort is normal.  Genitourinary:   Skin:    General: Skin is warm and dry.  Neurological:     General: No focal deficit present.     Mental Status: She is alert and oriented to person, place, and time.  Psychiatric:        Mood and Affect: Mood normal.        Behavior: Behavior normal.     Results for orders placed or performed during the hospital encounter of 02/10/22 (from the past 24 hour(s))  POCT urinalysis dipstick     Status: Abnormal   Collection Time: 02/10/22 11:14 AM  Result Value Ref Range   Color, UA yellow yellow   Clarity, UA clear clear   Glucose, UA negative negative mg/dL   Bilirubin, UA negative negative   Ketones, POC UA negative negative mg/dL   Spec Grav, UA 1.020 1.010 - 1.025   Blood, UA trace-lysed (A) negative   pH, UA 6.0 5.0 - 8.0   Protein Ur, POC negative negative mg/dL   Urobilinogen, UA 0.2 0.2 or 1.0 E.U./dL   Nitrite, UA Negative Negative   Leukocytes, UA Negative Negative     Assessment and Plan :   PDMP not reviewed this encounter.  1. Bacterial vaginosis   2. Vaginal discharge   3. Urinary frequency   4. Vaginal lesion     We will treat empirically for bacterial vaginosis and concurrent yeast infection with metronidazole and fluconazole.  Labs pending, will treat as appropriate otherwise.  Patient has 2 distinct lesions over the medial lower labia minora.  Recommended further evaluation and consideration for procedural removal, biopsy through gynecologist.  Counseled patient on potential for adverse effects with medications prescribed/recommended today, ER and return-to-clinic precautions discussed, patient verbalized understanding.    Jaynee Eagles, PA-C 02/10/22 1132

## 2022-02-10 NOTE — Discharge Instructions (Signed)
Please start metronidazole for suspected bacterial vaginosis infection and fluconazole for yeast infection.  Regarding the vaginal lesion (bump), make sure you follow up with a gynecologist for a consultation regarding removal and possible biopsy.

## 2022-02-10 NOTE — ED Triage Notes (Signed)
Pt c/o urinary freq, vaginal d/c x 1-2 weeks-also c/o "bump to my vagina" noticed 3 days ago-NAD-steady gait

## 2022-02-13 LAB — CERVICOVAGINAL ANCILLARY ONLY
Bacterial Vaginitis (gardnerella): POSITIVE — AB
Candida Glabrata: NEGATIVE
Candida Vaginitis: NEGATIVE
Chlamydia: NEGATIVE
Comment: NEGATIVE
Comment: NEGATIVE
Comment: NEGATIVE
Comment: NEGATIVE
Comment: NEGATIVE
Comment: NORMAL
Neisseria Gonorrhea: NEGATIVE
Trichomonas: NEGATIVE

## 2022-04-12 ENCOUNTER — Encounter: Payer: 59 | Admitting: Nurse Practitioner

## 2022-06-27 ENCOUNTER — Other Ambulatory Visit: Payer: Self-pay | Admitting: *Deleted

## 2022-06-27 DIAGNOSIS — I82432 Acute embolism and thrombosis of left popliteal vein: Secondary | ICD-10-CM

## 2022-06-27 NOTE — Progress Notes (Signed)
Received call from pt with complaint of swelling and pain in left lower extremity.  Pt hx of DVT on Xarelto.  Verbal orders received from MD for pt to have VAS [place] on left lower extremity and [facility] visit. Orders placed, appt scheduled and pt verbalized understanding of appt details.

## 2022-06-28 ENCOUNTER — Ambulatory Visit (HOSPITAL_COMMUNITY)
Admission: RE | Admit: 2022-06-28 | Discharge: 2022-06-28 | Disposition: A | Discharge status: Home / Self Care | Payer: 59 | Source: Ambulatory Visit | Attending: Physician Assistant | Admitting: Physician Assistant

## 2022-06-28 ENCOUNTER — Other Ambulatory Visit (HOSPITAL_COMMUNITY): Payer: Self-pay

## 2022-06-28 ENCOUNTER — Other Ambulatory Visit: Payer: Self-pay

## 2022-06-28 ENCOUNTER — Inpatient Hospital Stay: Payer: 59 | Attending: Physician Assistant | Admitting: Physician Assistant

## 2022-06-28 VITALS — BP 136/79 | HR 91 | Temp 98.5°F | Resp 16 | Wt 197.0 lb

## 2022-06-28 DIAGNOSIS — M79605 Pain in left leg: Secondary | ICD-10-CM | POA: Diagnosis present

## 2022-06-28 DIAGNOSIS — D6862 Lupus anticoagulant syndrome: Secondary | ICD-10-CM | POA: Insufficient documentation

## 2022-06-28 DIAGNOSIS — Z800 Family history of malignant neoplasm of digestive organs: Secondary | ICD-10-CM | POA: Diagnosis not present

## 2022-06-28 DIAGNOSIS — F17210 Nicotine dependence, cigarettes, uncomplicated: Secondary | ICD-10-CM | POA: Insufficient documentation

## 2022-06-28 DIAGNOSIS — I82432 Acute embolism and thrombosis of left popliteal vein: Secondary | ICD-10-CM

## 2022-06-28 DIAGNOSIS — Z7901 Long term (current) use of anticoagulants: Secondary | ICD-10-CM | POA: Diagnosis not present

## 2022-06-28 DIAGNOSIS — I82452 Acute embolism and thrombosis of left peroneal vein: Secondary | ICD-10-CM | POA: Insufficient documentation

## 2022-06-28 MED ORDER — APIXABAN (ELIQUIS) VTE STARTER PACK (10MG AND 5MG)
ORAL_TABLET | ORAL | 0 refills | Status: DC
  Filled 2022-06-28: qty 74, 30d supply, fill #0

## 2022-06-28 NOTE — Progress Notes (Signed)
Symptom Management Consult Note [facility]    Patient Care Team: Arnette FeltsMoore, Janece, FNP as PCP - General (General Practice)    Name / MRN / DOB: Susan King  [id number]  05/14/1970   Date of visit: 06/28/2022   Chief Complaint/Reason for visit: left leg pain    ASSESSMENT & PLAN: Patient is a 52 y.o. female  with pertinent history of lupus anticoagulant positive and unprovoked DVT followed by Dr. Pamelia HoitGudena.  I have viewed most recent oncology note and lab work.    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 Imaging is positive for DVT in her left peroneal vein, age indeterminate. US tech commented on segment being small. -On exam patient is well-appearing.  She is hemodynamically stable.  Clear lung exam.  She has tenderness to palpation of the left calf.  No palpable cords and compartments are soft. -Chart review shows she had US 07/24/21 that was negative for DVT. -As ultrasound is positive for DVT will switch to Eliquis from Xarelto.  Prescription for starter pack sent to the pharmacy. I contacted patient after she picked up the prescription to let her know she actually does not need a loading dose since she has been compliant with her previous prescription of Xarelto. I instructed her to take 5mg  BID for the next 30 days until she follows up with Dr. Pamelia HoitGudena at her next appointment on 07/26/2022.I also discussed bleeding precautions with patient.  Strict ED precautions discussed should symptoms worsen.   Heme/Onc History: Oncology History   No history exists.      Interval history-: Susan LibraKaren Anctil is a 52 y.o. female with pertient history of unprovoked DVT and lupus anticoagulant positive [facility] with chief complaint of left leg pain x 1 week. She presents unaccompanied to clinic.   Patient reports pain worsen x 3 days ago.  Pain is located in her calf.  She describes it as  sharp, aching and throbbing.  Pain does not radiate. Pain is constant, worse when laying in bed or with ambulation.  She denies any fall or injury.  She is compliant with her Xarelto and has never missed a dose.  She admits to working a sedentary job, sitting at her desk without getting up for breaks during the day. She noticed some swelling in her left foot yesterday.  She denies any shortness of breath or chest pain. No OTC medications taking PTA.   ROS  All other systems are reviewed and are negative for acute change except as noted in the HPI.    No Known Allergies   Past Medical History:  Diagnosis Date   Depression    DVT (deep venous thrombosis) (HCC)    Headache    Migraines   HSV-2 (herpes simplex virus 2) infection    Stroke (HCC) 03/20/2006   slight, no residual     Past Surgical History:  Procedure Laterality Date   APPENDECTOMY     LAPAROSCOPIC VAGINAL HYSTERECTOMY WITH SALPINGECTOMY Bilateral 10/15/2015   Procedure: LAPAROSCOPIC ASSISTED VAGINAL HYSTERECTOMY WITH SALPINGECTOMY;  Surgeon: Hal MoralesVanessa P Haygood, MD;  Location: [facility];  Service: Gynecology;  Laterality: Bilateral;  3 hours    Social History   Socioeconomic History   Marital status: Single    Spouse name: Not on file   Number of children: 2   Years of education: Not on file   Highest education level: Not  on file  Occupational History   Not on file  Tobacco Use   Smoking status: Every Day    Packs/day: 0.15    Years: 30.00    Additional pack years: 0.00    Total pack years: 4.50    Types: Cigarettes   Smokeless tobacco: Never  Vaping Use   Vaping Use: Never used  Substance and Sexual Activity   Alcohol use: Yes    Comment: occ   Drug use: No   Sexual activity: Yes    Birth control/protection: Surgical  Other Topics Concern   Not on file  Social History Narrative   Not on file   Social Determinants of Health   Financial Resource Strain: Not on file  Food Insecurity: Not on file   Transportation Needs: Not on file  Physical Activity: Not on file  Stress: Not on file  Social Connections: Not on file  Intimate Partner Violence: Not on file    Family History  Problem Relation Age of Onset   Heart disease Mother    Diabetes Mother    Hypertension Mother    Heart disease Father    Anemia Father    Cancer Sister        esophogeal   Heart disease Maternal Grandfather      Current Outpatient Medications:    APIXABAN (ELIQUIS) VTE STARTER PACK (10MG  AND 5MG ), Take as directed on package: start with two-5mg  tablets twice daily for 7 days. On day 8, switch to one-5mg  tablet twice daily., Disp: 74 each, Rfl: 0   albuterol (VENTOLIN HFA) 108 (90 Base) MCG/ACT inhaler, Inhale 1-2 puffs into the lungs every 6 (six) hours as needed for wheezing or shortness of breath., Disp: 8 g, Rfl: 0   DULoxetine (CYMBALTA) 30 MG capsule, Take 30 mg by mouth daily., Disp: , Rfl:    fluconazole (DIFLUCAN) 150 MG tablet, Take 1 tablet (150 mg total) by mouth once a week., Disp: 2 tablet, Rfl: 0   Insulin Pen Needle (BD PEN NEEDLE NANO U/F) 32G X 4 MM MISC, Use as directed to inject Victoza SQ QD., Disp: 100 each, Rfl: 1   liraglutide (VICTOZA) 18 MG/3ML SOPN, INJECT 1.8 MG UNDER THE SKIN ONCE DAILY, Disp: 9 mL, Rfl: 1   metroNIDAZOLE (FLAGYL) 500 MG tablet, Take 1 tablet (500 mg total) by mouth 2 (two) times daily with a meal. DO NOT CONSUME ALCOHOL WHILE TAKING THIS MEDICATION., Disp: 14 tablet, Rfl: 0   Saccharomyces boulardii (PROBIOTIC) 250 MG CAPS, Take 1 capsule by mouth 2 (two) times daily., Disp: 60 capsule, Rfl: 2   topiramate (TOPAMAX) 25 MG tablet, Take 100 mg by mouth 2 (two) times daily., Disp: , Rfl:    traZODone (DESYREL) 50 MG tablet, Take 50 mg by mouth at bedtime., Disp: , Rfl:    Ubrogepant (UBRELVY) 100 MG TABS, Take 100 mg by mouth as needed (For resolution of migraines). , Disp: , Rfl:    valACYclovir (VALTREX) 500 MG tablet, Take 1 tablet (500 mg total) by mouth  daily., Disp: 30 tablet, Rfl: 2  PHYSICAL EXAM: ECOG FS:1 - Symptomatic but completely ambulatory    Vitals:   06/28/22 1344  BP: 136/79  Pulse: 91  Resp: 16  Temp: 98.5 F (36.9 C)  TempSrc: Oral  SpO2: 100%  Weight: 197 lb (89.4 kg)   Physical Exam Vitals and nursing note reviewed.  Constitutional:      Appearance: She is well-developed. She is not ill-appearing or toxic-appearing.  HENT:  Head: Normocephalic.     Nose: Nose normal.  Eyes:     Conjunctiva/sclera: Conjunctivae normal.  Neck:     Vascular: No JVD.  Cardiovascular:     Rate and Rhythm: Normal rate and regular rhythm.     Pulses: Normal pulses.          Dorsalis pedis pulses are 2+ on the left side.     Heart sounds: Normal heart sounds.  Pulmonary:     Effort: Pulmonary effort is normal.     Breath sounds: Normal breath sounds.  Abdominal:     General: There is no distension.  Musculoskeletal:     Cervical back: Normal range of motion.     Right lower leg: No edema.       Legs:     Comments: Compartments of left lower extremity are soft. No palpable cords. Neurovascularly intact distally. Mild swelling to dorsum of left foot. Full ROM of ankles and toes.  Skin:    General: Skin is warm and dry.  Neurological:     Mental Status: She is oriented to person, place, and time.     Comments: Ambulatory with normal gait        LABORATORY DATA: I have reviewed the data as listed    Latest Ref Rng & Units 07/23/2021   10:50 PM 04/11/2021   10:12 AM 01/09/2020    6:34 PM  CBC  WBC 4.0 - 10.5 K/uL 7.5  5.9    Hemoglobin 12.0 - 15.0 g/dL 16.1  09.6  04.5   Hematocrit 36.0 - 46.0 % 36.4  39.3  39.0   Platelets 150 - 400 K/uL 214  224          Latest Ref Rng & Units 07/23/2021   10:50 PM 04/11/2021   10:12 AM 01/09/2020    6:34 PM  CMP  Glucose 70 - 99 mg/dL 409  85  80   BUN 6 - 20 mg/dL 19  12  11    Creatinine 0.44 - 1.00 mg/dL 8.11  9.14  7.82   Sodium 135 - 145 mmol/L 135  137  140    Potassium 3.5 - 5.1 mmol/L 3.4  4.1  3.2   Chloride 98 - 111 mmol/L 109  102  106   CO2 22 - 32 mmol/L 22  20    Calcium 8.9 - 10.3 mg/dL 8.8  9.4    Total Protein 6.0 - 8.5 g/dL  7.1    Total Bilirubin 0.0 - 1.2 mg/dL  0.2    Alkaline Phos 44 - 121 IU/L  88    AST 0 - 40 IU/L  11    ALT 0 - 32 IU/L  18         RADIOGRAPHIC STUDIES (from last 24 hours if applicable) I have personally reviewed the radiological images as listed and agreed with the findings in the report. VAS [place] LOWER EXTREMITY VENOUS (DVT)  Result Date: 06/28/2022  Lower Venous DVT Study Patient Name:  LUCIANN GOSSETT  Date of Exam:   06/28/2022 Medical Rec #: [id number]     Accession #:    [id number] Date of Birth: September 19, 1970     Patient Gender: F Patient Age:   52 years Exam Location:  [facility] Procedure:      VAS [place] LOWER EXTREMITY VENOUS (DVT) Referring Phys: Namon Cirri --------------------------------------------------------------------------------  Indications: Pain, and Swelling of left calf.  Risk Factors: DVT LLE (2020). Comparison Study: Previous exam on 07/24/21 was  negative for DVT. Performing Technologist: Ernestene Mention RVT, RDMS  Examination Guidelines: A complete evaluation includes B-mode imaging, spectral Doppler, color Doppler, and power Doppler as needed of all accessible portions of each vessel. Bilateral testing is considered an integral part of a complete examination. Limited examinations for reoccurring indications may be performed as noted. The reflux portion of the exam is performed with the patient in reverse Trendelenburg.  +-----+---------------+---------+-----------+----------+--------------+ RIGHTCompressibilityPhasicitySpontaneityPropertiesThrombus Aging +-----+---------------+---------+-----------+----------+--------------+ CFV  Full           Yes      Yes                                 +-----+---------------+---------+-----------+----------+--------------+    +---------+---------------+---------+-----------+----------+-------------------+ LEFT     CompressibilityPhasicitySpontaneityPropertiesThrombus Aging      +---------+---------------+---------+-----------+----------+-------------------+ CFV      Full           Yes      Yes                                      +---------+---------------+---------+-----------+----------+-------------------+ SFJ      Full                                                             +---------+---------------+---------+-----------+----------+-------------------+ FV Prox  Full           Yes      Yes                                      +---------+---------------+---------+-----------+----------+-------------------+ FV Mid   Full           Yes      Yes                                      +---------+---------------+---------+-----------+----------+-------------------+ FV DistalFull           Yes      Yes                                      +---------+---------------+---------+-----------+----------+-------------------+ PFV      Full                                                             +---------+---------------+---------+-----------+----------+-------------------+ POP      Full           Yes      Yes                                      +---------+---------------+---------+-----------+----------+-------------------+ PTV      Full                                                             +---------+---------------+---------+-----------+----------+-------------------+  PERO     Partial        No       No                   Age indeterminate -                                                       one of paired       +---------+---------------+---------+-----------+----------+-------------------+    Summary: RIGHT: - No evidence of common femoral vein obstruction.  LEFT: - Findings consistent with age indeterminate deep vein thrombosis involving one of the left  peroneal veins.  *See table(s) above for measurements and observations.    Preliminary         Visit Diagnosis: 1. Acute deep vein thrombosis (DVT) of popliteal vein of left lower extremity      No orders of the defined types were placed in this encounter.   All questions were answered. The patient knows to call the clinic with any problems, questions or concerns. No barriers to learning was detected.  I have spent a total of 30 minutes minutes of face-to-face and non-face-to-face time, preparing to see the patient, obtaining and/or reviewing separately obtained history, performing a medically appropriate examination, counseling and educating the patient, ordering tests, documenting clinical information in the electronic health record, and care coordination (communications with other health care professionals or caregivers).    Thank you for allowing me to participate in the care of this patient.    Shanon Ace, PA-C Department of Hematology/Oncology [facility] at [facility] Phone: [phone]  [phone]    06/28/2022 4:25 PM

## 2022-06-28 NOTE — Progress Notes (Signed)
LLE venous duplex has been completed.  Preliminary results given to [facility], PA-C.   Results can be found under chart review under CV PROC. 06/28/2022 1:35 PM Jalyne Brodzinski RVT, RDMS

## 2022-07-05 ENCOUNTER — Telehealth: Payer: Self-pay | Admitting: Hematology and Oncology

## 2022-07-05 NOTE — Telephone Encounter (Signed)
Rescheduled appointment per provider template. Patient is aware of the changes made to her upcoming appointment 

## 2022-07-26 ENCOUNTER — Telehealth: Payer: 59 | Admitting: Hematology and Oncology

## 2022-07-27 NOTE — Progress Notes (Signed)
HEMATOLOGY-ONCOLOGY WEBEX VISIT PROGRESS NOTE  I connected with Susan King on 07/31/2022 at 11:45 AM EDT by Mychart video conference and verified that I am speaking with the correct person using two identifiers.  I discussed the limitations, risks, security and privacy concerns of performing an evaluation and management service by Webex and the availability of in person appointments.  I also discussed with the patient that there may be a patient responsible charge related to this service. The patient expressed understanding and agreed to proceed.  Patient's Location: Home Physician Location: Clinic  CHIEF COMPLIANT: Follow up Dvt  INTERVAL HISTORY: Susan King is a 52 y.o. female with above-mentioned history of Dvt. She presents to the clinic for a annual MyChart visit.  In April she underwent an ultrasound of the leg for complaints of left leg pain and swelling and was noted to have DVT and left leg.  She was started on Eliquis and Xarelto was discontinued.  Overall she reports quick she is more tired than before.  Other than that she is tolerating it well.    REVIEW OF SYSTEMS:   Constitutional: Denies fevers, chills or abnormal weight loss All other systems were reviewed with the patient and are negative.  Observations/Objective:  There were no vitals filed for this visit. There is no height or weight on file to calculate BMI.  I have reviewed the data as listed    Latest Ref Rng & Units 07/23/2021   10:50 PM 04/11/2021   10:12 AM 01/09/2020    6:34 PM  CMP  Glucose 70 - 99 mg/dL 578  85  80   BUN 6 - 20 mg/dL 19  12  11    Creatinine 0.44 - 1.00 mg/dL 4.69  6.29  5.28   Sodium 135 - 145 mmol/L 135  137  140   Potassium 3.5 - 5.1 mmol/L 3.4  4.1  3.2   Chloride 98 - 111 mmol/L 109  102  106   CO2 22 - 32 mmol/L 22  20    Calcium 8.9 - 10.3 mg/dL 8.8  9.4    Total Protein 6.0 - 8.5 g/dL  7.1    Total Bilirubin 0.0 - 1.2 mg/dL  0.2    Alkaline Phos 44 - 121 IU/L  88    AST 0 -  40 IU/L  11    ALT 0 - 32 IU/L  18      Lab Results  Component Value Date   WBC 7.5 07/23/2021   HGB 11.9 (L) 07/23/2021   HCT 36.4 07/23/2021   MCV 87.1 07/23/2021   PLT 214 07/23/2021   NEUTROABS 3.2 07/23/2021      Assessment Plan:  DVT (deep venous thrombosis) (HCC) First episode of unprovoked blood clot November 2020 (popliteal vein) Second Episode: 06/28/22: Foot and calf pain: [place]: Age indeterminate DVT involving Left peroneal Veins  Current treatment: Originally Xarelto but now Eliquis   Patient's risk factors include: 1. 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 Tobacco use: Patient discontinued smoking November 2020 3. Obesity: BMI 36.5 4. Sedentary behavior including postoperative state: Patient's job is completely sedentary    Repeat testing 08/26/2019: Consistent with the presence of lupus anticoagulant Recommendations anticoagulation for life.  (Because of recurrent blood clots): Currently on Eliquis.  I renewed her prescription today.  Recheck in 1 year  I discussed the assessment and treatment plan with the patient. The patient was provided an opportunity to ask questions and all were answered. The patient agreed with  the plan and demonstrated an understanding of the instructions. The patient was advised to call back or seek an in-person evaluation if the symptoms worsen or if the condition fails to improve as anticipated.   I provided 12 minutes of face-to-face Web Ex time during this encounter.    Sabas Sous, MD 07/31/2022 I Janan Ridge am acting as a Neurosurgeon for [employer]  I have reviewed the above documentation for accuracy and completeness, and I agree with the above.

## 2022-07-31 ENCOUNTER — Inpatient Hospital Stay: Payer: 59 | Attending: Physician Assistant | Admitting: Hematology and Oncology

## 2022-07-31 DIAGNOSIS — I82432 Acute embolism and thrombosis of left popliteal vein: Secondary | ICD-10-CM | POA: Diagnosis not present

## 2022-07-31 MED ORDER — APIXABAN 5 MG PO TABS: 5.0000 mg | ORAL_TABLET | Freq: Two times a day (BID) | ORAL | 3 refills | Status: DC

## 2022-07-31 NOTE — Assessment & Plan Note (Addendum)
First episode of unprovoked blood clot November 2020 (popliteal vein) Second Episode: 06/28/22: Foot and calf pain: [place]: Age indeterminate DVT involving Left peroneal Veins  Current treatment: Originally Xarelto but now Eliquis   Patient's risk factors include: 1. 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 Tobacco use: Patient discontinued smoking November 2020 3. Obesity: BMI 36.5 4. Sedentary behavior including postoperative state: Patient's job is completely sedentary     Repeat testing 08/26/2019: Consistent with the presence of lupus anticoagulant Recommendations anticoagulation for life.   Emergency room visit 07/23/2021: Left lower extremity pain and swelling: Ultrasound: DVT negative Continue with indefinite Xarelto  Recheck in 1 year

## 2022-08-25 ENCOUNTER — Telehealth: Payer: Self-pay | Admitting: Nurse Practitioner

## 2022-08-25 NOTE — Telephone Encounter (Signed)
Called pt to schedule appt due to not being seen since 2023 pt stated she has switched providers and no longer at pt at [facility]

## 2022-11-21 ENCOUNTER — Ambulatory Visit: Payer: 59 | Admitting: Family Medicine

## 2022-12-06 ENCOUNTER — Ambulatory Visit: Payer: 59 | Admitting: Family Medicine

## 2023-01-15 ENCOUNTER — Ambulatory Visit: Payer: 59 | Admitting: Family Medicine

## 2023-01-22 ENCOUNTER — Encounter: Payer: 59 | Admitting: Physician Assistant

## 2023-01-22 ENCOUNTER — Other Ambulatory Visit: Payer: Self-pay | Admitting: Physician Assistant

## 2023-01-22 ENCOUNTER — Other Ambulatory Visit: Payer: Self-pay | Admitting: Hematology and Oncology

## 2023-01-22 ENCOUNTER — Ambulatory Visit (HOSPITAL_COMMUNITY)
Admission: RE | Admit: 2023-01-22 | Discharge: 2023-01-22 | Disposition: A | Discharge status: Home / Self Care | Payer: 59 | Source: Ambulatory Visit | Attending: Physician Assistant | Admitting: Physician Assistant

## 2023-01-22 ENCOUNTER — Ambulatory Visit (HOSPITAL_COMMUNITY)
Admission: RE | Admit: 2023-01-22 | Discharge: 2023-01-22 | Disposition: A | Discharge status: Home / Self Care | Payer: 59 | Source: Ambulatory Visit | Attending: Physician Assistant

## 2023-01-22 ENCOUNTER — Inpatient Hospital Stay: Payer: 59 | Attending: Physician Assistant | Admitting: Physician Assistant

## 2023-01-22 ENCOUNTER — Telehealth: Payer: Self-pay | Admitting: Hematology and Oncology

## 2023-01-22 ENCOUNTER — Other Ambulatory Visit: Payer: Self-pay

## 2023-01-22 VITALS — BP 110/81 | HR 87 | Temp 98.2°F | Resp 18 | Wt 202.9 lb

## 2023-01-22 DIAGNOSIS — Z86718 Personal history of other venous thrombosis and embolism: Secondary | ICD-10-CM | POA: Diagnosis not present

## 2023-01-22 DIAGNOSIS — M79673 Pain in unspecified foot: Secondary | ICD-10-CM | POA: Insufficient documentation

## 2023-01-22 DIAGNOSIS — Z90710 Acquired absence of both cervix and uterus: Secondary | ICD-10-CM | POA: Insufficient documentation

## 2023-01-22 DIAGNOSIS — Z780 Asymptomatic menopausal state: Secondary | ICD-10-CM | POA: Insufficient documentation

## 2023-01-22 DIAGNOSIS — I82432 Acute embolism and thrombosis of left popliteal vein: Secondary | ICD-10-CM

## 2023-01-22 DIAGNOSIS — M79672 Pain in left foot: Secondary | ICD-10-CM | POA: Diagnosis present

## 2023-01-22 DIAGNOSIS — Z8673 Personal history of transient ischemic attack (TIA), and cerebral infarction without residual deficits: Secondary | ICD-10-CM | POA: Insufficient documentation

## 2023-01-22 DIAGNOSIS — Z7901 Long term (current) use of anticoagulants: Secondary | ICD-10-CM | POA: Diagnosis not present

## 2023-01-22 NOTE — Telephone Encounter (Signed)
Pt called with c/o swelling in left lower leg again. She is a pt of Dr.Gudena's and being seen for DVT. She stated that it's painful and swelling. Appt was made for DVT study and pt will be seen for symptom management by Corbin Ade., P.A. Pt called and made aware of times and verbalized understanding.

## 2023-01-22 NOTE — Progress Notes (Signed)
[facility] OFFICE PROGRESS NOTE  No primary care provider on file. No primary provider on file.  DIAGNOSIS: DVT in left leg, first episode of unprovoked blood clot in November 2020. Second episode 06/28/22: Foot and calf pain: [place]: Age indeterminate DVT involving Left peroneal Veins   Prior Therapy: Gibson Ramp was discontinued after second DVT  CURRENT THERAPY: Eliquis   INTERVAL HISTORY: Susan King 52 y.o. female returns to the clinic for an acute visit.  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  She initially had a DVT in 2020 and was placed on Xarelto.  This was discontinued when she had her second DVT and April 2024. Her xarelto was discontinued and she is on eliquis. Her risk factors include being positive lupus anticoagulant positive.  She quit smoking in November 2020. Her other risk factors include obesity and having a sedentary job. She had been on Eliquis.  She denies any missed doses.  She tolerates Eliquis well; however, the patient tells me that she has had some vaginal bleeding.  She states she was treated for urinary tract infection 2 weeks ago and denies any dysuria.  When asked for clarification about blood from the urethra versus the vagina, she clarified that the blood is from the vagina when wiping.  She has a history of a partial hysterectomy.  [place] [facility]  She was last seen by gynecology 1 year ago.  She is postmenopausal.   She called the clinic endorsing dorsal left foot pain.  She also reports some swelling.  She reports this has been going on for few weeks.  She reports that her pain was a 100/10 yesterday.  She denies any traumas or injuries to the left foot.  She states that she thought that she "slept wrong".  She has been using Tylenol.  She had previously been icing her foot.  The swelling had improved but she feels like the swelling started again.  She denies any calf  pain.  Denies any overlying erythema or bruising.  Denies any history of gout.   Denies any abnormal bleeding or bruising.  She had a lower extremity ultrasound performed today to rule out DVT.  She is here today to review her results.  Since she is having trouble standing on her foot, she asked to have a work note to work from home for the next few days.  MEDICAL HISTORY: Past Medical History:  Diagnosis Date   Depression    DVT (deep venous thrombosis) (HCC)    Headache    Migraines   HSV-2 (herpes simplex virus 2) infection    Stroke (HCC) 03/20/2006   slight, no residual    ALLERGIES:  has No Known Allergies.  MEDICATIONS:  Current Outpatient Medications  Medication Sig Dispense Refill   albuterol (VENTOLIN HFA) 108 (90 Base) MCG/ACT inhaler Inhale 1-2 puffs into the lungs every 6 (six) hours as needed for wheezing or shortness of breath. 8 g 0   apixaban (ELIQUIS) 5 MG TABS tablet Take 1 tablet (5 mg total) by mouth 2 (two) times daily. 180 tablet 3   DULoxetine (CYMBALTA) 30 MG capsule Take 30 mg by mouth daily.     fluconazole (DIFLUCAN) 150 MG tablet Take 1 tablet (150 mg total) by mouth once a week. 2 tablet 0   Insulin Pen Needle (BD PEN NEEDLE NANO U/F) 32G X 4 MM MISC Use as directed  to inject Victoza SQ QD. 100 each 1   liraglutide (VICTOZA) 18 MG/3ML SOPN INJECT 1.8 MG UNDER THE SKIN ONCE DAILY 9 mL 1   metroNIDAZOLE (FLAGYL) 500 MG tablet Take 1 tablet (500 mg total) by mouth 2 (two) times daily with a meal. DO NOT CONSUME ALCOHOL WHILE TAKING THIS MEDICATION. 14 tablet 0   Saccharomyces boulardii (PROBIOTIC) 250 MG CAPS Take 1 capsule by mouth 2 (two) times daily. 60 capsule 2   topiramate (TOPAMAX) 25 MG tablet Take 100 mg by mouth 2 (two) times daily.     traZODone (DESYREL) 50 MG tablet Take 50 mg by mouth at bedtime.     Ubrogepant (UBRELVY) 100 MG TABS Take 100 mg by mouth as needed (For resolution of migraines).      valACYclovir (VALTREX) 500 MG tablet Take 1  tablet (500 mg total) by mouth daily. 30 tablet 2   No current facility-administered medications for this visit.    SURGICAL HISTORY:  Past Surgical History:  Procedure Laterality Date   APPENDECTOMY     LAPAROSCOPIC VAGINAL HYSTERECTOMY WITH SALPINGECTOMY Bilateral 10/15/2015   Procedure: LAPAROSCOPIC ASSISTED VAGINAL HYSTERECTOMY WITH SALPINGECTOMY;  Surgeon: Hal Morales, MD;  Location: [facility];  Service: Gynecology;  Laterality: Bilateral;  3 hours    REVIEW OF SYSTEMS:   Review of Systems  Constitutional: Negative for appetite change, chills, fatigue, fever and unexpected weight change.  HENT: Negative for mouth sores, nosebleeds, sore throat and trouble swallowing.   Eyes: Negative for eye problems and icterus.  Respiratory: Negative for cough, hemoptysis, shortness of breath and wheezing.   Cardiovascular: Negative for chest pain and leg swelling.  Gastrointestinal: Negative for abdominal pain, constipation, diarrhea, nausea and vomiting.  Genitourinary: Positive for mild vaginal bleeding. Negative for bladder incontinence, difficulty urinating, dysuria, frequency and hematuria.   Musculoskeletal: Positive for focal tenderness on the dorsal aspect of her foot. Mild swelling on top of left foot without erythema or bruising. Negative for back pain, gait problem, neck pain and neck stiffness.  Skin: Negative for itching and rash.  Neurological: Negative for dizziness, extremity weakness, gait problem, headaches, light-headedness and seizures.  Hematological: Negative for adenopathy. Does not bruise/bleed easily.  Psychiatric/Behavioral: Negative for confusion, depression and sleep disturbance. The patient is not nervous/anxious.     PHYSICAL EXAMINATION:  Last menstrual period 09/07/2015.  ECOG PERFORMANCE STATUS: 1  Physical Exam  Constitutional: Oriented to person, place, and time and well-developed, well-nourished, and in no distress.  HENT:  Head: Normocephalic and  atraumatic.  Mouth/Throat: Oropharynx is clear and moist. No oropharyngeal exudate.  Eyes: Conjunctivae are normal. Right eye exhibits no discharge. Left eye exhibits no discharge. No scleral icterus.  Neck: Normal range of motion. Neck supple.  Cardiovascular: Normal rate, regular rhythm, normal heart sounds and intact distal pulses.   Pulmonary/Chest: Effort normal and breath sounds normal. No respiratory distress. No wheezes. No rales.  Abdominal: Soft. Bowel sounds are normal. Exhibits no distension and no mass. There is no tenderness.  Musculoskeletal: Normal range of motion. Mild swelling over dorsal aspect of left foot and focal tenderness to the mid dorsal left foot. No bruising or erythema. Some pain to put weight on left foot and with certain movements. No calf pain or calf swelling.  Lymphadenopathy:    No cervical adenopathy.  Neurological: Alert and oriented to person, place, and time. Exhibits normal muscle tone. Gait normal. Coordination normal.  Skin: Skin is warm and dry. No rash noted. Not diaphoretic. No erythema.  No pallor.  Psychiatric: Mood, memory and judgment normal.  Vitals reviewed.  LABORATORY DATA: Lab Results  Component Value Date   WBC 7.5 07/23/2021   HGB 11.9 (L) 07/23/2021   HCT 36.4 07/23/2021   MCV 87.1 07/23/2021   PLT 214 07/23/2021      Chemistry      Component Value Date/Time   NA 135 07/23/2021 2250   NA 137 04/11/2021 1012   K 3.4 (L) 07/23/2021 2250   CL 109 07/23/2021 2250   CO2 22 07/23/2021 2250   BUN 19 07/23/2021 2250   BUN 12 04/11/2021 1012   CREATININE 0.90 07/23/2021 2250   CREATININE 0.88 08/26/2019 1314   CREATININE 0.90 04/22/2019 1142      Component Value Date/Time   CALCIUM 8.8 (L) 07/23/2021 2250   ALKPHOS 88 04/11/2021 1012   AST 11 04/11/2021 1012   AST 15 08/26/2019 1314   ALT 18 04/11/2021 1012   ALT 37 08/26/2019 1314   BILITOT 0.2 04/11/2021 1012   BILITOT 0.5 08/26/2019 1314       RADIOGRAPHIC  STUDIES:  No results found.   ASSESSMENT/PLAN:  This is a very pleasant 52 year old African-American female with history of unprovoked DVT.  She is positive for lupus anticoagulant,, sedentary job, and obesity.  She quit smoking in 2020  Her first DVT was in November 2020 popliteal vein for which she was placed on Xarelto.  Her second episode was in April 2024 she presented with foot and calf pain and her ultrasound showed age-indeterminate DVT involving the left peroneal veins.  She was switched to Eliquis.  Today, she was seen following an ultrasound due to the complaint of dorsal foot pain.  Her preliminary ultrasound is negative for DVT and actually showed improvement compared to her prior DVT study in April 2024. This is good news especially since she previously had failed xarelto and had she been positive for DVT, the alternative options may have included lovenox.   Recommend that she continue on Eliquis and follow up in may 2025 when planned. The patient was wondering if she can be put on a lower dose of eliquis since her US showed no DVT. I let her know given her history and risk factors, that the recommendation would be to continue maintenance eliquis at the same dose and lifelong anticoagulation.   She had very mild swelling over the dorsal aspect of the foot with tenderness. No bruising or erythema. I will obtain x-ray to ensure no fracture but this seems most consistent with musculoskeletal issue. She has some pain with standing on her left foot. If x-ray negative, I would recommend she follow up with her PCP for further evaluation of her left foot pain.   She asked that I write her a work note so she can work from home. I will write her out for 3 days until she follow with her PCP. She can also consider going to the orthopedic urgent care. In the meantime, I recommend she continue tylenol, elevate, and use ice.   Regarding the postmenopausal bleeding, I discussed with the patient's  GYN the importance of seeing GYN to make sure she is up-to-date on cervical cancer screening and they may likely consider her for pelvic ultrasound for screening for uterine cancer/abnormality and pelvic exam.  I offered to call her GYN office for her to help facilitate scheduling an appointment.  The patient states that she does not need me to call on her behalf and she is competent to  call them upon returning home.  Discussed with Dr. Pamelia Hoit via secure chat who is in agreement with the plan.    The patient was advised to call immediately if she has any concerning symptoms in the interval. The patient voices understanding of current disease status and treatment options and is in agreement with the current care plan. All questions were answered. The patient knows to call the clinic with any problems, questions or concerns. We can certainly see the patient much sooner if necessary    No orders of the defined types were placed in this encounter.    The total time spent in the appointment was 30-39 minutes  Michelyn Scullin L [facility] [place], PA-C 01/22/23

## 2023-01-22 NOTE — Progress Notes (Signed)
Lower extremity venous duplex completed. Please see CV Procedures for preliminary results.  Shona Simpson, RVT 01/22/23 2:53 PM

## 2023-01-23 ENCOUNTER — Telehealth: Payer: Self-pay

## 2023-01-23 ENCOUNTER — Encounter (HOSPITAL_COMMUNITY): Payer: 59

## 2023-01-23 NOTE — Telephone Encounter (Signed)
Pt was seen yesterday with Cassie, PA in regards to foot/leg pain. DVT study was negative. Per Wilford Sports, PA-  Xray ordered yesterday. Pt has an arched foot.  Recommends f/u with PCP or ortho urgent care. For now, rest, elevated and use ice as needed. Recommended f/u with GYN in regards to bleeding. Continue with prescribed eliquis and f/u with Dr. Pamelia Hoit in May 2025. Informed pt to call with any concerns or questions.

## 2023-01-27 ENCOUNTER — Telehealth: Payer: Self-pay | Admitting: Hematology and Oncology

## 2023-01-27 NOTE — Telephone Encounter (Signed)
Scheduled per 11/05 los, patient has been called and notified of upcoming May appointments.

## 2023-03-07 ENCOUNTER — Other Ambulatory Visit: Payer: Self-pay

## 2023-03-07 ENCOUNTER — Encounter (HOSPITAL_COMMUNITY): Payer: Self-pay | Admitting: *Deleted

## 2023-03-07 ENCOUNTER — Ambulatory Visit (HOSPITAL_COMMUNITY)
Admission: EM | Admit: 2023-03-07 | Discharge: 2023-03-07 | Disposition: A | Discharge status: Home / Self Care | Payer: 59 | Attending: Emergency Medicine | Admitting: Emergency Medicine

## 2023-03-07 DIAGNOSIS — N3000 Acute cystitis without hematuria: Secondary | ICD-10-CM | POA: Insufficient documentation

## 2023-03-07 DIAGNOSIS — N898 Other specified noninflammatory disorders of vagina: Secondary | ICD-10-CM | POA: Diagnosis present

## 2023-03-07 HISTORY — DX: Urinary tract infection, site not specified: N39.0

## 2023-03-07 LAB — POCT URINALYSIS DIP (MANUAL ENTRY)
Blood, UA: NEGATIVE
Glucose, UA: 100 mg/dL — AB
Ketones, POC UA: NEGATIVE mg/dL
Leukocytes, UA: NEGATIVE
Nitrite, UA: POSITIVE — AB
Protein Ur, POC: 30 mg/dL — AB
Spec Grav, UA: 1.02 (ref 1.010–1.025)
Urobilinogen, UA: 2 U/dL — AB
pH, UA: 6.5 (ref 5.0–8.0)

## 2023-03-07 LAB — POCT FASTING CBG KUC MANUAL ENTRY: POCT Glucose (KUC): 93 mg/dL (ref 70–99)

## 2023-03-07 MED ORDER — NITROFURANTOIN MONOHYD MACRO 100 MG PO CAPS: 100.0000 mg | ORAL_CAPSULE | Freq: Two times a day (BID) | ORAL | 0 refills | Status: DC

## 2023-03-07 NOTE — ED Provider Notes (Signed)
[facility]    CSN: [id number] Arrival date & time: 03/07/23  0901      History   Chief Complaint Chief Complaint  Patient presents with   Dysuria    HPI Susan King is a 52 y.o. female.   Patient presents to clinic for urinary urgency, frequency, dysuria and cloudiness to her urine that has been present for the past 2 weeks.  She has been taking over-the-counter AZO.  Has not had any fevers.  She is having some low back pain.  Does have a history of recurrent urinary tract infections.  No nausea, emesis or diarrhea.   The history is provided by the patient and medical records.  Dysuria   Past Medical History:  Diagnosis Date   Depression    DVT (deep venous thrombosis) (HCC)    Headache    Migraines   HSV-2 (herpes simplex virus 2) infection    Stroke (HCC) 03/20/2006   slight, no residual   UTI (urinary tract infection)     Patient Active Problem List   Diagnosis Date Noted   History of DVT of lower extremity 01/22/2023   Precordial pain 04/25/2019   DVT (deep venous thrombosis) (HCC) 01/29/2019   GAD (generalized anxiety disorder) 01/26/2017   Migraines 07/18/2016   Major depression, recurrent (HCC) 07/18/2016   Fibroids, subserous 10/15/2015   Uterus, adenomyosis 10/14/2015   Herpes simplex type 2 infection 06/15/2015   Obesity 06/17/2013   Tobacco use disorder 06/17/2013   History of CVA in adulthood 03/20/2006    Past Surgical History:  Procedure Laterality Date   APPENDECTOMY     LAPAROSCOPIC VAGINAL HYSTERECTOMY WITH SALPINGECTOMY Bilateral 10/15/2015   Procedure: LAPAROSCOPIC ASSISTED VAGINAL HYSTERECTOMY WITH SALPINGECTOMY;  Surgeon: Hal Morales, MD;  Location: [facility];  Service: Gynecology;  Laterality: Bilateral;  3 hours    OB History   No obstetric history on file.      Home Medications    Prior to Admission medications   Medication Sig Start Date End Date Taking? Authorizing Provider  apixaban (ELIQUIS) 5 MG TABS  tablet Take 1 tablet (5 mg total) by mouth 2 (two) times daily. 07/31/22  Yes Serena Croissant, MD  DULoxetine (CYMBALTA) 30 MG capsule Take 30 mg by mouth daily.   Yes [provider]  nitrofurantoin, macrocrystal-monohydrate, (MACROBID) 100 MG capsule Take 1 capsule (100 mg total) by mouth 2 (two) times daily. 03/07/23  Yes Rinaldo Ratel, [place] N, FNP  topiramate (TOPAMAX) 25 MG tablet Take 100 mg by mouth 2 (two) times daily. 12/02/19  Yes [provider]  traZODone (DESYREL) 50 MG tablet Take 50 mg by mouth at bedtime.   Yes [provider]  Insulin Pen Needle (BD PEN NEEDLE NANO U/F) 32G X 4 MM MISC Use as directed to inject Victoza SQ QD. Patient not taking: Reported on 01/22/2023 04/28/19   Salley Scarlet, MD  liraglutide (VICTOZA) 18 MG/3ML SOPN INJECT 1.8 MG UNDER THE SKIN ONCE DAILY Patient not taking: Reported on 01/22/2023 04/11/21   Arnette Felts, FNP  Ubrogepant (UBRELVY) 100 MG TABS Take 100 mg by mouth as needed (For resolution of migraines).     [provider]  valACYclovir (VALTREX) 500 MG tablet Take 1 tablet (500 mg total) by mouth daily. Patient taking differently: Take 500 mg by mouth as needed. 06/07/18   Salley Scarlet, MD    Family History Family History  Problem Relation Age of Onset   Heart disease Mother    Diabetes  Mother    Hypertension Mother    Heart disease Father    Anemia Father    Cancer Sister        esophogeal   Heart disease Maternal Grandfather     Social History Social History   Tobacco Use   Smoking status: Every Day    Current packs/day: 0.15    Average packs/day: 0.2 packs/day for 30.0 years (4.5 ttl pk-yrs)    Types: Cigarettes   Smokeless tobacco: Never  Vaping Use   Vaping status: Never Used  Substance Use Topics   Alcohol use: Yes    Comment: occasionally   Drug use: No     Allergies   Patient has no known allergies.   Review of Systems Review of Systems  Per HPI   Physical Exam Triage  Vital Signs ED Triage Vitals  Encounter Vitals Group     BP 03/07/23 0915 (!) 142/88     Systolic BP Percentile --      Diastolic BP Percentile --      Pulse Rate 03/07/23 0915 94     Resp 03/07/23 0915 (!) 22     Temp 03/07/23 0915 98 F (36.7 C)     Temp Source 03/07/23 0915 Oral     SpO2 03/07/23 0915 98 %     Weight --      Height --      Head Circumference --      Peak Flow --      Pain Score 03/07/23 0917 0     Pain Loc --      Pain Education --      Exclude from Growth Chart --    No data found.  Updated Vital Signs BP (!) 142/88   Pulse 94   Temp 98 F (36.7 C) (Oral)   Resp (!) 22   LMP 09/07/2015 (Approximate)   SpO2 98%   Visual Acuity Right Eye Distance:   Left Eye Distance:   Bilateral Distance:    Right Eye Near:   Left Eye Near:    Bilateral Near:     Physical Exam Vitals and nursing note reviewed.  Constitutional:      Appearance: Normal appearance.  HENT:     Head: Normocephalic and atraumatic.     Right Ear: External ear normal.     Left Ear: External ear normal.     Nose: Nose normal.     Mouth/Throat:     Mouth: Mucous membranes are moist.  Eyes:     Conjunctiva/sclera: Conjunctivae normal.  Cardiovascular:     Rate and Rhythm: Normal rate.  Pulmonary:     Effort: Pulmonary effort is normal. No respiratory distress.  Abdominal:     Tenderness: There is no right CVA tenderness or left CVA tenderness.  Musculoskeletal:        General: Normal range of motion.  Skin:    General: Skin is warm and dry.  Neurological:     General: No focal deficit present.     Mental Status: She is alert and oriented to person, place, and time.  Psychiatric:        Mood and Affect: Mood normal.        Behavior: Behavior normal.      UC Treatments / Results  Labs (all labs ordered are listed, but only abnormal results are displayed) Labs Reviewed  POCT URINALYSIS DIP (MANUAL ENTRY) - Abnormal; Notable for the following components:      Result  Value  Color, UA orange (*)    Glucose, UA =100 (*)    Bilirubin, UA small (*)    Protein Ur, POC =30 (*)    Urobilinogen, UA 2.0 (*)    Nitrite, UA Positive (*)    All other components within normal limits  URINE CULTURE  POCT FASTING CBG K[facility] MANUAL ENTRY  CERVICOVAGINAL ANCILLARY ONLY    EKG   Radiology No results found.  Procedures Procedures (including critical care time)  Medications Ordered in UC Medications - No data to display  Initial Impression / Assessment and Plan / UC Course  I have reviewed the triage vital signs and the nursing notes.  Pertinent labs & imaging results that were available during my care of the patient were reviewed by me and considered in my medical decision making (see chart for details).  Vitals and triage reviewed, patient is hemodynamically stable.  Negative for CVA tenderness.  Afebrile.  No tachycardia.  Low concern for pyelonephritis at this time.  Urine is orange, suspect Pyridium.  This showed nitrates, will treat with Macrobid and send for culture.  Encourage patient to follow-up with PCP regarding recurrent UTIs.  She is having some vaginal itching as well with no discharge.  Cytology swab obtained.  Plan of care, follow-up care return precautions given, no questions at this time.  Work note provided.     Final Clinical Impressions(s) / UC Diagnoses   Final diagnoses:  Acute cystitis without hematuria  Vaginal itching     Discharge Instructions      Your urine sample showed that you have a urinary tract infection.  Please take the antibiotics as prescribed and until finished.  Sending your urine off for culture and we will contact you if any therapy modification is needed.  Ensure you are drinking plenty of water to help flush out your kidneys.  You can continue to take the Azo 2-3 times daily to help with any pain with urination.  We are sending a vaginal swab to test for vaginal infections.  Will contact you if any treatment  is indicated.  Please follow-up with your primary care provider as scheduled.     ED Prescriptions     Medication Sig Dispense Auth. Provider   nitrofurantoin, macrocrystal-monohydrate, (MACROBID) 100 MG capsule Take 1 capsule (100 mg total) by mouth 2 (two) times daily. 10 capsule Allie Gerhold, [place] N, FNP      PDMP not reviewed this encounter.   Rinaldo Ratel [place] N, [place] 03/07/23 [phone]

## 2023-03-07 NOTE — Discharge Instructions (Addendum)
Your urine sample showed that you have a urinary tract infection.  Please take the antibiotics as prescribed and until finished.  Sending your urine off for culture and we will contact you if any therapy modification is needed.  Ensure you are drinking plenty of water to help flush out your kidneys.  You can continue to take the Azo 2-3 times daily to help with any pain with urination.  We are sending a vaginal swab to test for vaginal infections.  Will contact you if any treatment is indicated.  Please follow-up with your primary care provider as scheduled.

## 2023-03-07 NOTE — ED Triage Notes (Addendum)
C/O dysuria, cloudy urine onset 2 wks ago. Has been taking AZO. No known fevers. Starting with some slight bilat flank pain. Pt reports getting recurrent UTIs.

## 2023-03-08 ENCOUNTER — Telehealth (HOSPITAL_COMMUNITY): Payer: Self-pay

## 2023-03-08 LAB — CERVICOVAGINAL ANCILLARY ONLY
Bacterial Vaginitis (gardnerella): POSITIVE — AB
Candida Glabrata: NEGATIVE
Candida Vaginitis: NEGATIVE
Chlamydia: NEGATIVE
Comment: NEGATIVE
Comment: NEGATIVE
Comment: NEGATIVE
Comment: NEGATIVE
Comment: NEGATIVE
Comment: NORMAL
Neisseria Gonorrhea: NEGATIVE
Trichomonas: POSITIVE — AB

## 2023-03-08 LAB — URINE CULTURE: Culture: <10000 — AB

## 2023-03-08 MED ORDER — METRONIDAZOLE 500 MG PO TABS: 500.0000 mg | ORAL_TABLET | Freq: Two times a day (BID) | ORAL | 0 refills | Status: AC

## 2023-03-08 NOTE — Telephone Encounter (Signed)
Per protocol, pt requires tx with metronidazole. Attempted to reach patient x1. VM full.  Rx sent to pharmacy on file.

## 2023-03-15 ENCOUNTER — Ambulatory Visit: Payer: 59 | Admitting: Family Medicine

## 2023-03-16 IMAGING — US US EXTREM LOW VENOUS*L*
1 series · 14 of 24 positions shown · non-contrast
Comparison: None Available.

CLINICAL DATA: Pain and edema in left extremity.

EXAM:
LEFT LOWER EXTREMITY VENOUS DOPPLER ULTRASOUND
TECHNIQUE: Gray-scale sonography with compression, as well as color and duplex
ultrasound, were performed to evaluate the deep venous system(s)
from the level of the common femoral vein through the popliteal and
proximal calf veins.

[Series 1: us extrem low venous*left* · 14 of 46 slices shown]
[im 1/46]
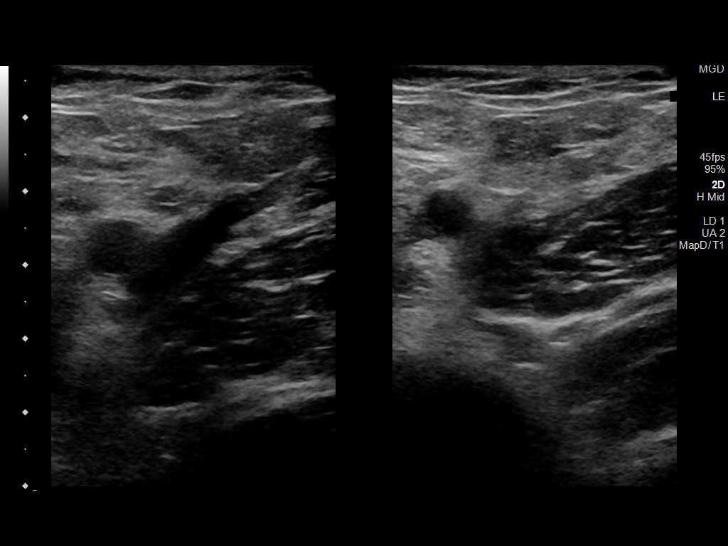
[im 4/46]
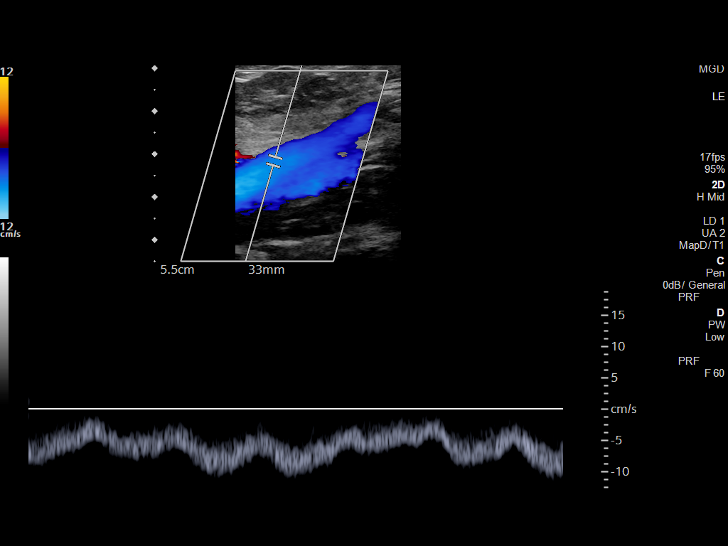
[im 8/46]
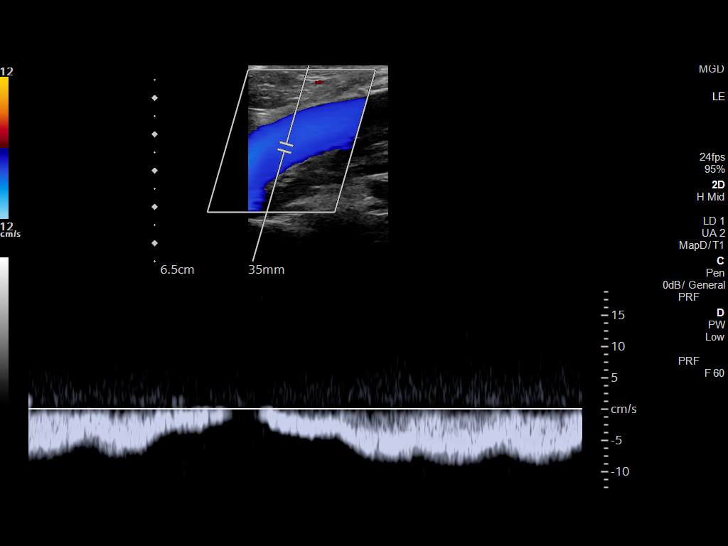
[im 12/46]
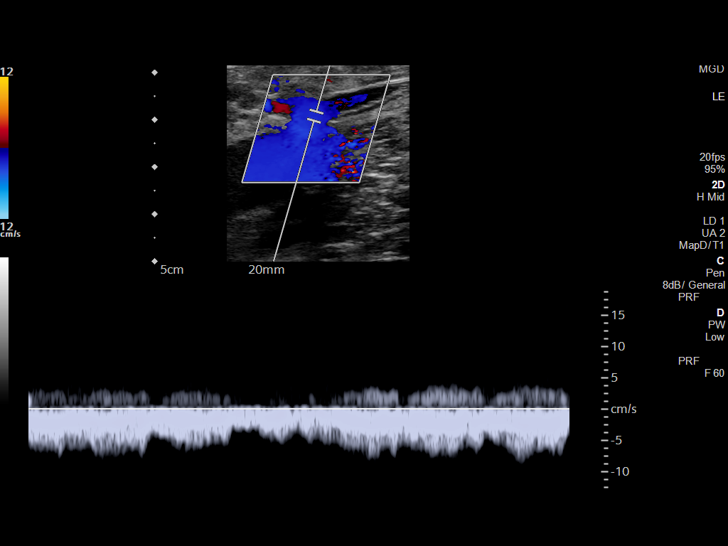
[im 14/46]
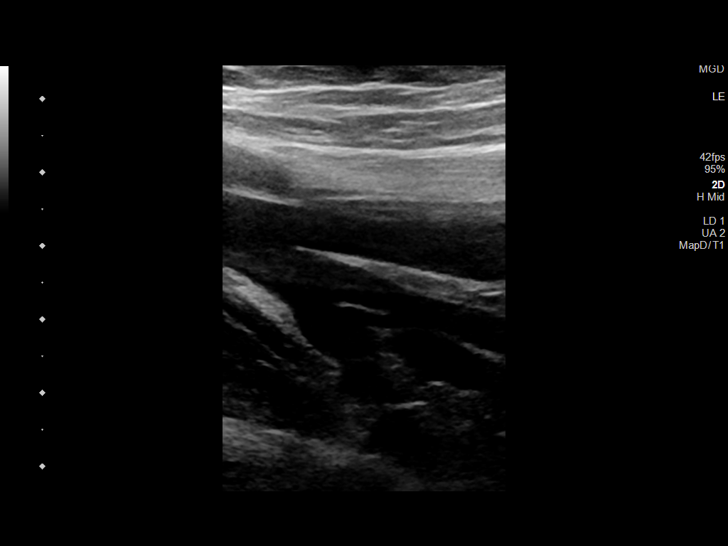
[im 18/46]
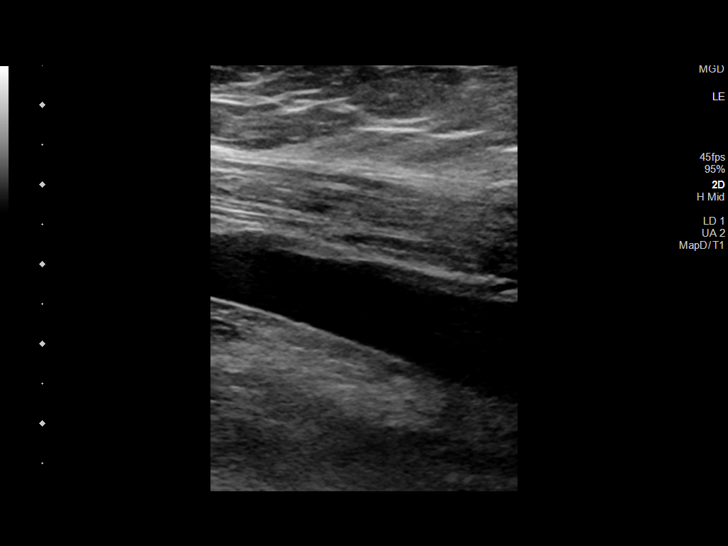
[im 22/46]
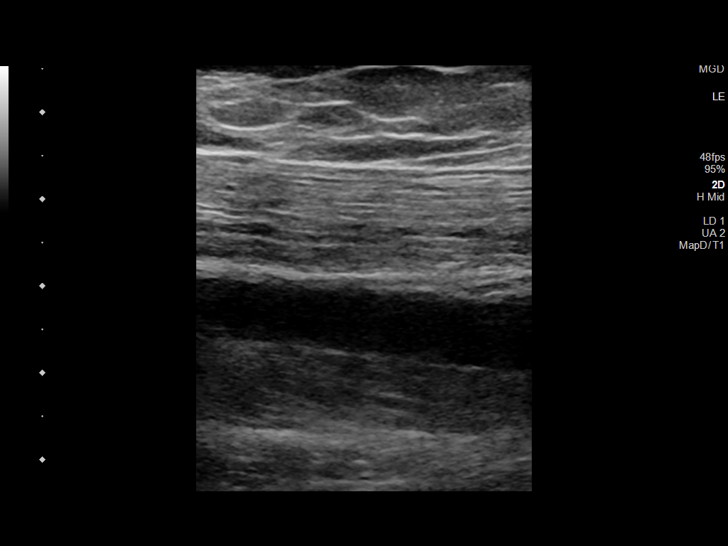
[im 24/46]
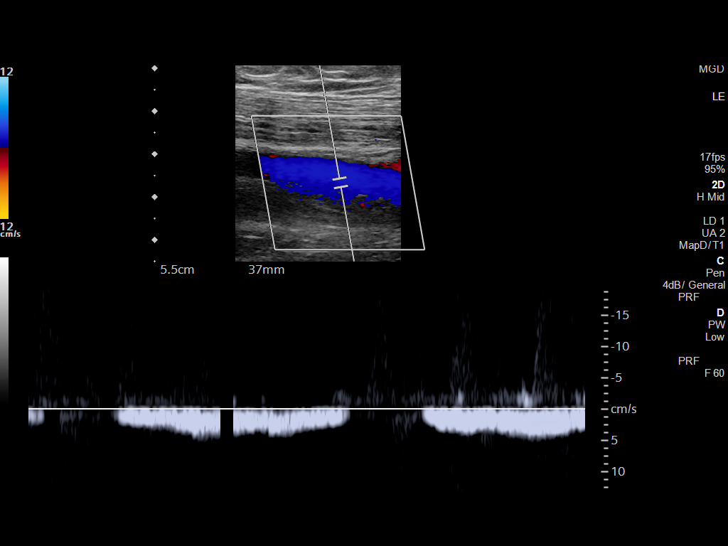
[im 28/46]
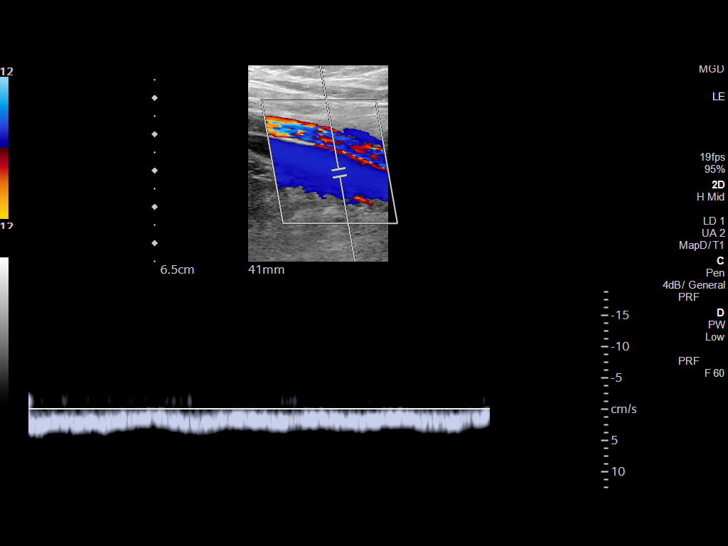
[im 32/46]
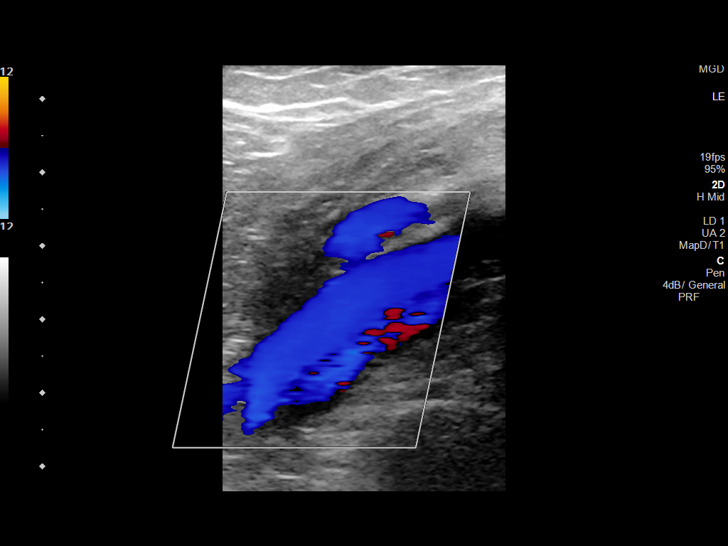
[im 36/46]
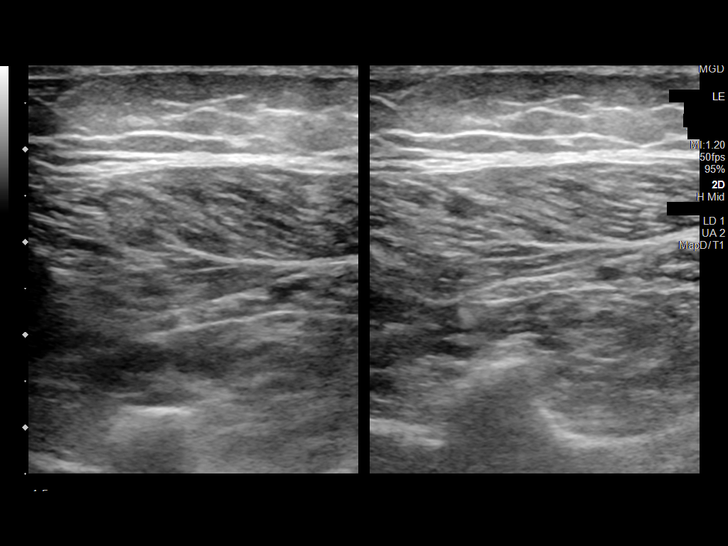
[im 38/46]
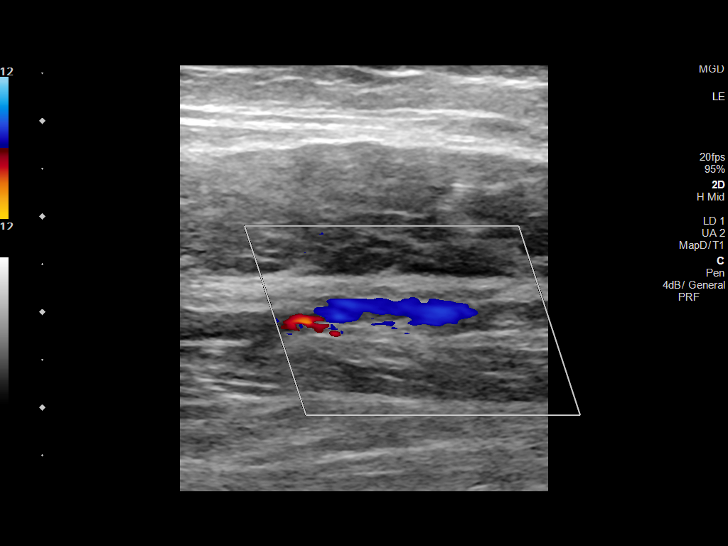
[im 42/46]
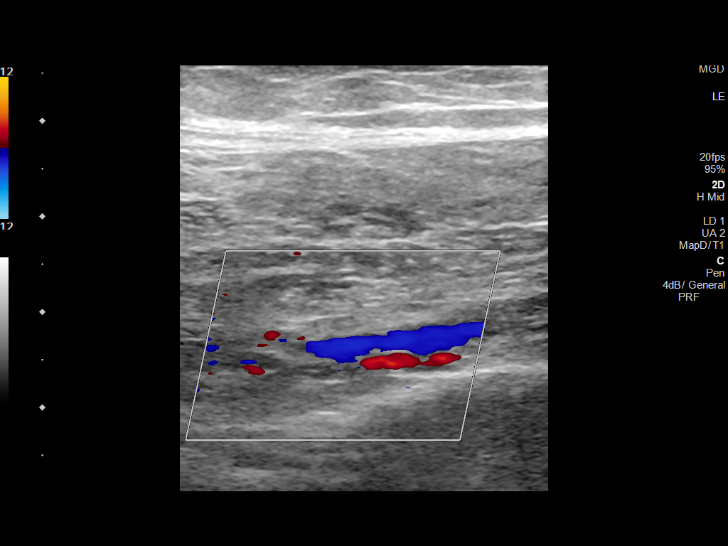
[im 46/46]
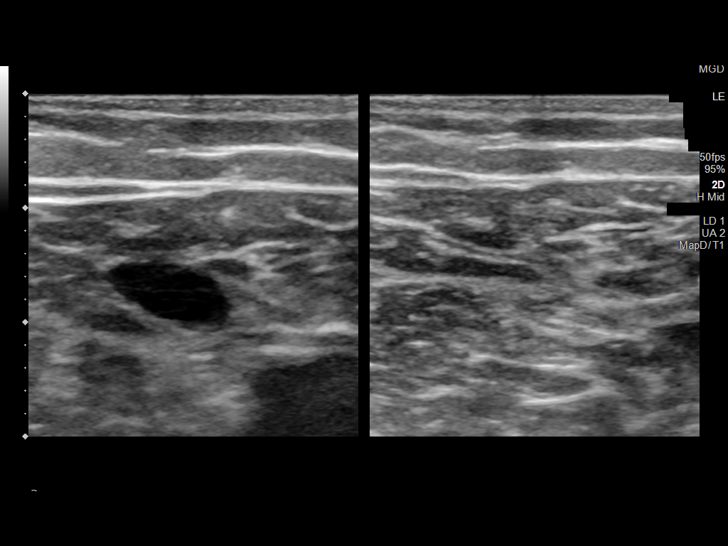

[14 of 24 positions shown; findings below may reference images not displayed]

FINDINGS: VENOUS

Normal compressibility of the common femoral, superficial femoral,
and popliteal veins, as well as the visualized calf veins.
Visualized portions of profunda femoral vein and great saphenous
vein unremarkable. No filling defects to suggest DVT on grayscale or
color Doppler imaging. Doppler waveforms show normal direction of
venous flow, normal respiratory plasticity and response to
augmentation.

Limited views of the contralateral common femoral vein are
unremarkable.

OTHER

None.

Limitations: none
IMPRESSION: No DVT identified.

## 2023-03-19 ENCOUNTER — Ambulatory Visit: Payer: 59 | Admitting: Family Medicine

## 2023-03-27 ENCOUNTER — Ambulatory Visit: Payer: 59 | Admitting: Family Medicine

## 2023-04-25 ENCOUNTER — Telehealth: Payer: Self-pay | Admitting: Family Medicine

## 2023-04-25 ENCOUNTER — Ambulatory Visit: Payer: 59 | Admitting: Nurse Practitioner

## 2023-04-25 ENCOUNTER — Ambulatory Visit: Payer: 59 | Admitting: Family Medicine

## 2023-04-25 ENCOUNTER — Encounter: Payer: Self-pay | Admitting: Family Medicine

## 2023-04-25 VITALS — BP 120/80 | HR 90 | Temp 98.0°F | Ht 62.5 in | Wt 196.2 lb

## 2023-04-25 DIAGNOSIS — G43709 Chronic migraine without aura, not intractable, without status migrainosus: Secondary | ICD-10-CM

## 2023-04-25 DIAGNOSIS — I82432 Acute embolism and thrombosis of left popliteal vein: Secondary | ICD-10-CM | POA: Diagnosis not present

## 2023-04-25 DIAGNOSIS — Z1211 Encounter for screening for malignant neoplasm of colon: Secondary | ICD-10-CM

## 2023-04-25 DIAGNOSIS — F411 Generalized anxiety disorder: Secondary | ICD-10-CM

## 2023-04-25 DIAGNOSIS — R760 Raised antibody titer: Secondary | ICD-10-CM

## 2023-04-25 DIAGNOSIS — F17200 Nicotine dependence, unspecified, uncomplicated: Secondary | ICD-10-CM

## 2023-04-25 DIAGNOSIS — Z1231 Encounter for screening mammogram for malignant neoplasm of breast: Secondary | ICD-10-CM

## 2023-04-25 DIAGNOSIS — R7303 Prediabetes: Secondary | ICD-10-CM

## 2023-04-25 DIAGNOSIS — B351 Tinea unguium: Secondary | ICD-10-CM

## 2023-04-25 DIAGNOSIS — F330 Major depressive disorder, recurrent, mild: Secondary | ICD-10-CM

## 2023-04-25 DIAGNOSIS — N760 Acute vaginitis: Secondary | ICD-10-CM

## 2023-04-25 DIAGNOSIS — R7309 Other abnormal glucose: Secondary | ICD-10-CM | POA: Insufficient documentation

## 2023-04-25 DIAGNOSIS — Z7901 Long term (current) use of anticoagulants: Secondary | ICD-10-CM

## 2023-04-25 DIAGNOSIS — B9689 Other specified bacterial agents as the cause of diseases classified elsewhere: Secondary | ICD-10-CM | POA: Insufficient documentation

## 2023-04-25 DIAGNOSIS — Z9229 Personal history of other drug therapy: Secondary | ICD-10-CM

## 2023-04-25 MED ORDER — LIRAGLUTIDE 18 MG/3ML ~~LOC~~ SOPN: PEN_INJECTOR | SUBCUTANEOUS | 5 refills | Status: AC

## 2023-04-25 MED ORDER — VALACYCLOVIR HCL 500 MG PO TABS: 500.0000 mg | ORAL_TABLET | ORAL | 1 refills | Status: AC | PRN

## 2023-04-25 NOTE — Progress Notes (Signed)
 Patient Office Visit  Assessment & Plan:  Prediabetes -     CBC with Differential/Platelet -     COMPLETE METABOLIC PANEL WITH GFR -     Hemoglobin A1c -     Lipid panel -     Liraglutide ; INJECT 1.8 MG UNDER THE SKIN ONCE DAILY  Dispense: 9 mL; Refill: 5  Deep vein thrombosis (DVT) of popliteal vein of left lower extremity, unspecified chronicity (HCC)  Tobacco use disorder  GAD (generalized anxiety disorder)  Mild episode of recurrent major depressive disorder (HCC)  Chronic migraine without aura without status migrainosus, not intractable  Hx of long term use of blood thinners  Fungal toenail infection -     Ambulatory referral to Podiatry  Visit for screening mammogram -     MM 3D DIAGNOSTIC MAMMOGRAM BILATERAL BREAST; Future  Screening for colon cancer -     Ambulatory referral to Gastroenterology  Bacterial vaginosis  Lupus anticoagulant positive Assessment & Plan: Followed by Hematology   Other orders -     valACYclovir  HCl; Take 1 tablet (500 mg total) by mouth as needed.  Dispense: 60 tablet; Refill: 1   Test results were reviewed and analyzed as part of the medical decision making of this visit.  Reviewed hematology notes, and previous primary care notes during the office visit. Pt needs to se up a CPE next time.  Pt will restart medications. Podiatry consult ordered, GI consult ordered for colon cancer screening.  3D mammogram ordered at the breast center in [place].  Patient is to return for physical in the next 3 months.  Recommend tobacco cessation.  Recommend healthy diet and starting gradual exercise and gradual weight loss. Return if symptoms worsen or fail to improve, for physical.   Subjective:    Patient ID: Susan King, female    DOB: [birth year]  Age: 53 y.o. MRN: [id number]  Chief Complaint  Patient presents with   Establish Care   Medical Management of Chronic Issues    HPI Is here to establish care and to follow-up on chronic  medical issues.  Patient has not seen a physician in a few years. prediabetes-previously controlled.  Denies diarrhea, peripheral swelling, hypoglycemia, excessive thirst, excessive urination, visual fluctuation, and fatigue.  Making an effort on diet control and exercise.  Patient's weight remains stable.  Patient does eat healthy for the most part and stays active.  Patient was on Victoza  in the past and it worked well for her.  Patient has not taken in the last couple years. Depression and anxiety-patient sees psychiatry via telemedicine [facility].  Patient has been doing well on current medications which include Cymbalta 60 mg once a day, trazodone 50 mg nightly and Valium as needed. Previous hx of unprovoked DVT/CVA-patient is on Eliquis  5 mg twice a day.  Patient does see hematology once a year.  Patient had thrombophilia workup and is positive for lupus anticoagulant.  Per hematology she needs to be anticoagulation for life.  Patient's not having any leg pain shortness of breath or chest pain.  Patient continues to smoke. Tobacco use-patient smokes about 1 pack/week.  Patient did quit for 6 months and restarted if she started training for her new job working at [employer] Migraine headaches-patient has been taking the Topamax  and Ubrelvy as needed. 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  Patient has not had a recent flareup. Recent bacterial vaginosis infection-patient has been seen twice for this-- the first time she was given  Flagyl  500 mg twice a day for 7 days and then she went to the urgent care [facility] and new medicine will be sent to the pharmacy. Health maintenance-patient has not had a colonoscopy, last mammogram was about 2 years ago. The ASCVD Risk score (Arnett DK, et al., 2019) failed to calculate for the following reasons:   Risk score cannot be calculated because patient has a medical history suggesting prior/existing ASCVD  Past Medical History:  Diagnosis  Date   Depression    DVT (deep venous thrombosis) (HCC)    Headache    Migraines   HSV-2 (herpes simplex virus 2) infection    Stroke (HCC) 03/20/2006   slight, no residual   UTI (urinary tract infection)    Past Surgical History:  Procedure Laterality Date   APPENDECTOMY     LAPAROSCOPIC VAGINAL HYSTERECTOMY WITH SALPINGECTOMY Bilateral 10/15/2015   Procedure: LAPAROSCOPIC ASSISTED VAGINAL HYSTERECTOMY WITH SALPINGECTOMY;  Surgeon: Shanda SHAUNNA Muscat, MD;  Location: [facility];  Service: Gynecology;  Laterality: Bilateral;  3 hours   Social History   Tobacco Use   Smoking status: Every Day    Current packs/day: 0.15    Average packs/day: 0.2 packs/day for 30.0 years (4.5 ttl pk-yrs)    Types: Cigarettes   Smokeless tobacco: Never  Vaping Use   Vaping status: Never Used  Substance Use Topics   Alcohol use: Yes    Comment: occasionally   Drug use: No   Family History  Problem Relation Age of Onset   Heart disease Mother    Diabetes Mother    Hypertension Mother    Heart disease Father    Anemia Father    Cancer Sister        esophogeal   Heart disease Maternal Grandfather    No Known Allergies  ROS    Objective:    BP 120/80   Pulse 90   Temp 98 F (36.7 C)   Ht 5' 2.5 (1.588 m)   Wt 196 lb 4 oz (89 kg)   LMP 09/07/2015 (Approximate)   SpO2 99%   BMI 35.32 kg/m  BP Readings from Last 3 Encounters:  04/25/23 120/80  03/07/23 (!) 142/88  01/22/23 110/81   Wt Readings from Last 3 Encounters:  04/25/23 196 lb 4 oz (89 kg)  01/22/23 202 lb 14.4 oz (92 kg)  06/28/22 197 lb (89.4 kg)    Physical Exam Vitals and nursing note reviewed.  Constitutional:      General: She is not in acute distress.    Appearance: Normal appearance.  HENT:     Head: Normocephalic.     Right Ear: Tympanic membrane, ear canal and external ear normal.     Left Ear: Tympanic membrane, ear canal and external ear normal.  Eyes:     Extraocular Movements: Extraocular movements  intact.     Conjunctiva/sclera: Conjunctivae normal.     Pupils: Pupils are equal, round, and reactive to light.  Cardiovascular:     Rate and Rhythm: Normal rate and regular rhythm.     Heart sounds: Normal heart sounds.  Pulmonary:     Effort: Pulmonary effort is normal.     Breath sounds: Normal breath sounds.  Musculoskeletal:     Right lower leg: No edema.     Left lower leg: No edema.  Skin:    Comments: Right great toe thickened toenail, discolored.   Neurological:     General: No focal deficit present.     Mental  Status: She is alert and oriented to person, place, and time.  Psychiatric:        Mood and Affect: Mood normal.        Behavior: Behavior normal.        Thought Content: Thought content normal.        Judgment: Judgment normal.      Results for orders placed or performed in visit on 04/25/23  CBC with Differential/Platelet  Result Value Ref Range   WBC 6.3 3.8 - 10.8 Thousand/uL   RBC 4.96 3.80 - 5.10 Million/uL   Hemoglobin 13.7 11.7 - 15.5 g/dL   HCT 57.5 64.9 - 54.9 %   MCV 85.5 80.0 - 100.0 fL   MCH 27.6 27.0 - 33.0 pg   MCHC 32.3 32.0 - 36.0 g/dL   RDW 87.0 88.9 - 84.9 %   Platelets 230 140 - 400 Thousand/uL   MPV 12.3 7.5 - 12.5 fL   Neutro Abs 2,791 1,500 - 7,800 cells/uL   Absolute Lymphocytes 2,671 850 - 3,900 cells/uL   Absolute Monocytes 441 200 - 950 cells/uL   Eosinophils Absolute 334 15 - 500 cells/uL   Basophils Absolute 63 0 - 200 cells/uL   Neutrophils Relative % 44.3 %   Total Lymphocyte 42.4 %   Monocytes Relative 7.0 %   Eosinophils Relative 5.3 %   Basophils Relative 1.0 %  COMPLETE METABOLIC PANEL WITH GFR  Result Value Ref Range   Glucose, Bld 81 65 - 99 mg/dL   BUN 12 7 - 25 mg/dL   Creat 9.17 9.49 - 8.96 mg/dL   eGFR 85 > OR = 60 fO/fpw/8.26f7   BUN/Creatinine Ratio SEE NOTE: 6 - 22 (calc)   Sodium 141 135 - 146 mmol/L   Potassium 4.6 3.5 - 5.3 mmol/L   Chloride 107 98 - 110 mmol/L   CO2 25 20 - 32 mmol/L   Calcium   9.9 8.6 - 10.4 mg/dL   Total Protein 7.6 6.1 - 8.1 g/dL   Albumin 4.6 3.6 - 5.1 g/dL   Globulin 3.0 1.9 - 3.7 g/dL (calc)   AG Ratio 1.5 1.0 - 2.5 (calc)   Total Bilirubin 0.6 0.2 - 1.2 mg/dL   Alkaline phosphatase (APISO) 92 37 - 153 U/L   AST 13 10 - 35 U/L   ALT 18 6 - 29 U/L  Hemoglobin A1c  Result Value Ref Range   Hgb A1c MFr Bld 6.3 (H) <5.7 % of total Hgb   Mean Plasma Glucose 134 mg/dL   eAG (mmol/L) 7.4 mmol/L  Lipid panel  Result Value Ref Range   Cholesterol 219 (H) <200 mg/dL   HDL 51 > OR = 50 mg/dL   Triglycerides 835 (H) <150 mg/dL   LDL Cholesterol (Calc) 138 (H) mg/dL (calc)   Total CHOL/HDL Ratio 4.3 <5.0 (calc)   Non-HDL Cholesterol (Calc) 168 (H) <130 mg/dL (calc)

## 2023-04-26 ENCOUNTER — Encounter: Payer: Self-pay | Admitting: Family Medicine

## 2023-04-26 LAB — HEMOGLOBIN A1C
Hgb A1c MFr Bld: 6.3 %{Hb} — ABNORMAL HIGH (ref <5.7)
Mean Plasma Glucose: 134 mg/dL
eAG (mmol/L): 7.4 mmol/L

## 2023-04-26 LAB — COMPLETE METABOLIC PANEL WITH GFR
AG Ratio: 1.5 (calc) (ref 1.0–2.5)
ALT: 18 U/L (ref 6–29)
AST: 13 U/L (ref 10–35)
Albumin: 4.6 g/dL (ref 3.6–5.1)
Alkaline phosphatase (APISO): 92 U/L (ref 37–153)
BUN: 12 mg/dL (ref 7–25)
CO2: 25 mmol/L (ref 20–32)
Calcium: 9.9 mg/dL (ref 8.6–10.4)
Chloride: 107 mmol/L (ref 98–110)
Creat: 0.82 mg/dL (ref 0.50–1.03)
Globulin: 3 g/dL (ref 1.9–3.7)
Glucose, Bld: 81 mg/dL (ref 65–99)
Potassium: 4.6 mmol/L (ref 3.5–5.3)
Sodium: 141 mmol/L (ref 135–146)
Total Bilirubin: 0.6 mg/dL (ref 0.2–1.2)
Total Protein: 7.6 g/dL (ref 6.1–8.1)
eGFR: 85 mL/min/{1.73_m2} (ref >=60)

## 2023-04-26 LAB — CBC WITH DIFFERENTIAL/PLATELET
Absolute Lymphocytes: 2671 {cells}/uL (ref 850–3900)
Absolute Monocytes: 441 {cells}/uL (ref 200–950)
Basophils Absolute: 63 {cells}/uL (ref 0–200)
Basophils Relative: 1 %
Eosinophils Absolute: 334 {cells}/uL (ref 15–500)
Eosinophils Relative: 5.3 %
HCT: 42.4 % (ref 35.0–45.0)
Hemoglobin: 13.7 g/dL (ref 11.7–15.5)
MCH: 27.6 pg (ref 27.0–33.0)
MCHC: 32.3 g/dL (ref 32.0–36.0)
MCV: 85.5 fL (ref 80.0–100.0)
MPV: 12.3 fL (ref 7.5–12.5)
Monocytes Relative: 7 %
Neutro Abs: 2791 {cells}/uL (ref 1500–7800)
Neutrophils Relative %: 44.3 %
Platelets: 230 10*3/uL (ref 140–400)
RBC: 4.96 10*6/uL (ref 3.80–5.10)
RDW: 12.9 % (ref 11.0–15.0)
Total Lymphocyte: 42.4 %
WBC: 6.3 10*3/uL (ref 3.8–10.8)

## 2023-04-26 LAB — LIPID PANEL
Cholesterol: 219 mg/dL — ABNORMAL HIGH (ref <200)
HDL: 51 mg/dL (ref >=50)
LDL Cholesterol (Calc): 138 mg/dL — ABNORMAL HIGH
Non-HDL Cholesterol (Calc): 168 mg/dL — ABNORMAL HIGH (ref <130)
Total CHOL/HDL Ratio: 4.3 (calc) (ref <5.0)
Triglycerides: 164 mg/dL — ABNORMAL HIGH (ref <150)

## 2023-04-27 ENCOUNTER — Encounter: Payer: Self-pay | Admitting: Family Medicine

## 2023-04-27 ENCOUNTER — Other Ambulatory Visit: Payer: Self-pay

## 2023-04-27 DIAGNOSIS — R760 Raised antibody titer: Secondary | ICD-10-CM | POA: Insufficient documentation

## 2023-04-27 DIAGNOSIS — R7303 Prediabetes: Secondary | ICD-10-CM

## 2023-04-27 MED ORDER — METFORMIN HCL ER 500 MG PO TB24: 500.0000 mg | ORAL_TABLET | Freq: Every day | ORAL | 1 refills | Status: DC

## 2023-04-27 MED ORDER — ROSUVASTATIN CALCIUM 10 MG PO TABS: 10.0000 mg | ORAL_TABLET | Freq: Every day | ORAL | 3 refills | Status: DC

## 2023-04-27 NOTE — Assessment & Plan Note (Signed)
Followed by Hematology 

## 2023-05-07 NOTE — Telephone Encounter (Signed)
Copied from CRM [id number]. Topic: Clinical - Medical Advice >> May 07, 2023  9:26 AM Antwanette L wrote: Reason for CRM: Patient is calling in because Dr.Aguiar put the patient on Metformin but its making her nauseous. Patient is wants to know if Dr. Riley Nearing can prescribe her something else? Patient will like a callback at [phone]

## 2023-05-09 ENCOUNTER — Other Ambulatory Visit: Payer: Self-pay

## 2023-05-09 MED ORDER — ONDANSETRON HCL 4 MG PO TABS: 4.0000 mg | ORAL_TABLET | Freq: Three times a day (TID) | ORAL | 0 refills | Status: DC | PRN

## 2023-05-11 ENCOUNTER — Ambulatory Visit: Payer: 59 | Admitting: Family Medicine

## 2023-05-11 NOTE — Progress Notes (Deleted)
 Patient Office Visit  Assessment & Plan:  Sore throat    No follow-ups on file.   Subjective:    Patient ID: Susan King, female    DOB: 30-Sep-1970  Age: 53 y.o. MRN: [id number]  No chief complaint on file.   HPI Started body aches, coughing, productive cough with streaks of blood, congestion, sinus pressure. Has been wheezing and SOB, fever and chills about 2 weeks. Severe sore throat. Tested COVID last week and was negative. OTC meds- theraflu, alkaselzer, Dayquil, Nyquil with little relief. Sore throat severe 10/10, hurts to swallow. Decreased appetite.  Did not get flu shot this season. Works at [employer]. Pt has been OOW since Tuesday due to feeling so bad  The ASCVD Risk score (Arnett DK, et al., 2019) failed to calculate for the following reasons:   Risk score cannot be calculated because patient has a medical history suggesting prior/existing ASCVD  Past Medical History:  Diagnosis Date  . Depression   . DVT (deep venous thrombosis) (HCC)   . Headache    Migraines  . HSV-2 (herpes simplex virus 2) infection   . Stroke (HCC) 03/20/2006   slight, no residual  . UTI (urinary tract infection)    Past Surgical History:  Procedure Laterality Date  . APPENDECTOMY    . LAPAROSCOPIC VAGINAL HYSTERECTOMY WITH SALPINGECTOMY Bilateral 10/15/2015   Procedure: LAPAROSCOPIC ASSISTED VAGINAL HYSTERECTOMY WITH SALPINGECTOMY;  Surgeon: Hal Morales, MD;  Location: [facility];  Service: Gynecology;  Laterality: Bilateral;  3 hours   Social History   Tobacco Use  . Smoking status: Every Day    Current packs/day: 0.15    Average packs/day: 0.2 packs/day for 30.0 years (4.5 ttl pk-yrs)    Types: Cigarettes  . Smokeless tobacco: Never  Vaping Use  . Vaping status: Never Used  Substance Use Topics  . Alcohol use: Yes    Comment: occasionally  . Drug use: No   Family History  Problem Relation Age of Onset  . Heart disease Mother   . Diabetes Mother   . Hypertension  Mother   . Heart disease Father   . Anemia Father   . Cancer Sister        esophogeal  . Heart disease Maternal Grandfather    No Known Allergies  ROS    Objective:    LMP 09/07/2015 (Approximate)  BP Readings from Last 3 Encounters:  04/25/23 120/80  03/07/23 (!) 142/88  01/22/23 110/81   Wt Readings from Last 3 Encounters:  04/25/23 196 lb 4 oz (89 kg)  01/22/23 202 lb 14.4 oz (92 kg)  06/28/22 197 lb (89.4 kg)    Physical Exam Vitals and nursing note reviewed.  Constitutional:      Appearance: Normal appearance.  HENT:     Head: Normocephalic.     Right Ear: Tympanic membrane, ear canal and external ear normal.     Left Ear: Tympanic membrane, ear canal and external ear normal.     Mouth/Throat:     Pharynx: Oropharyngeal exudate and posterior oropharyngeal erythema present.  Eyes:     Extraocular Movements: Extraocular movements intact.     Conjunctiva/sclera: Conjunctivae normal.     Pupils: Pupils are equal, round, and reactive to light.  Cardiovascular:     Rate and Rhythm: Normal rate and regular rhythm.     Heart sounds: Normal heart sounds.  Pulmonary:     Effort: Pulmonary effort is normal.     Breath sounds: Normal breath sounds.  Musculoskeletal:     Right lower leg: No edema.     Left lower leg: No edema.  Neurological:     General: No focal deficit present.     Mental Status: She is alert and oriented to person, place, and time.  Psychiatric:        Mood and Affect: Mood normal.        Behavior: Behavior normal.        Thought Content: Thought content normal.        Judgment: Judgment normal.    No results found for any visits on 05/11/23.  {Labs (Optional):23779}

## 2023-05-20 ENCOUNTER — Other Ambulatory Visit: Payer: Self-pay | Admitting: Family Medicine

## 2023-05-20 DIAGNOSIS — R7303 Prediabetes: Secondary | ICD-10-CM

## 2023-05-30 ENCOUNTER — Ambulatory Visit: Payer: 59 | Admitting: Podiatry

## 2023-07-13 ENCOUNTER — Ambulatory Visit: Admitting: Podiatry

## 2023-07-13 ENCOUNTER — Other Ambulatory Visit: Payer: Self-pay | Admitting: Podiatry

## 2023-07-13 DIAGNOSIS — Z79899 Other long term (current) drug therapy: Secondary | ICD-10-CM

## 2023-07-13 DIAGNOSIS — B351 Tinea unguium: Secondary | ICD-10-CM | POA: Diagnosis not present

## 2023-07-13 NOTE — Progress Notes (Signed)
 Subjective:  Patient ID: Susan King, female    DOB: 07-Nov-1970,  MRN: [id number]  Chief Complaint  Patient presents with   Nail Problem    Nail fungus follow    53 y.o. female presents with the above complaint. Patient presents with left mycotic toenails x 5 they have been present for quite some time.  She would like to discuss treatment options for nail fungus she has not seen MRIs prior to seeing me denies any other acute complaints she is try some topical medication which has not helped.  She would like to discuss oral medication   Review of Systems: Negative except as noted in the HPI. Denies N/V/F/Ch.  Past Medical History:  Diagnosis Date   Depression    DVT (deep venous thrombosis) (HCC)    Headache    Migraines   HSV-2 (herpes simplex virus 2) infection    Stroke (HCC) 03/20/2006   slight, no residual   UTI (urinary tract infection)     Current Outpatient Medications:    apixaban  (ELIQUIS ) 5 MG TABS tablet, Take 1 tablet (5 mg total) by mouth 2 (two) times daily., Disp: 180 tablet, Rfl: 3   diazepam (VALIUM) 5 MG tablet, Take 5 mg by mouth every 6 (six) hours as needed., Disp: , Rfl:    DULoxetine (CYMBALTA) 60 MG capsule, Take 60 mg by mouth daily., Disp: , Rfl:    Insulin Pen Needle (BD PEN NEEDLE NANO U/F) 32G X 4 MM MISC, Use as directed to inject Victoza  SQ QD. (Patient not taking: Reported on 01/22/2023), Disp: 100 each, Rfl: 1   liraglutide  (VICTOZA ) 18 MG/3ML SOPN, INJECT 1.8 MG UNDER THE SKIN ONCE DAILY, Disp: 9 mL, Rfl: 5   metFORMIN  (GLUCOPHAGE -XR) 500 MG 24 hr tablet, TAKE 1 TABLET BY MOUTH EVERY DAY WITH BREAKFAST, Disp: 90 tablet, Rfl: 1   nitrofurantoin , macrocrystal-monohydrate, (MACROBID ) 100 MG capsule, Take 1 capsule (100 mg total) by mouth 2 (two) times daily., Disp: 10 capsule, Rfl: 0   ondansetron  (ZOFRAN ) 4 MG tablet, Take 1 tablet (4 mg total) by mouth every 8 (eight) hours as needed for nausea or vomiting., Disp: 30 tablet, Rfl: 0   rosuvastatin   (CRESTOR ) 10 MG tablet, Take 1 tablet (10 mg total) by mouth daily., Disp: 90 tablet, Rfl: 3   topiramate  (TOPAMAX ) 25 MG tablet, Take 100 mg by mouth 2 (two) times daily., Disp: , Rfl:    traZODone (DESYREL) 50 MG tablet, Take 50 mg by mouth at bedtime., Disp: , Rfl:    Ubrogepant (UBRELVY) 100 MG TABS, Take 100 mg by mouth as needed (For resolution of migraines). , Disp: , Rfl:    valACYclovir  (VALTREX ) 500 MG tablet, Take 1 tablet (500 mg total) by mouth as needed., Disp: 60 tablet, Rfl: 1  Social History   Tobacco Use  Smoking Status Every Day   Current packs/day: 0.15   Average packs/day: 0.2 packs/day for 30.0 years (4.5 ttl pk-yrs)   Types: Cigarettes  Smokeless Tobacco Never    No Known Allergies Objective:  There were no vitals filed for this visit. There is no height or weight on file to calculate BMI. Constitutional Well developed. Well nourished.  Vascular Dorsalis pedis pulses palpable bilaterally. Posterior tibial pulses palpable bilaterally. Capillary refill normal to all digits.  No cyanosis or clubbing noted. Pedal hair growth normal.  Neurologic Normal speech. Oriented to person, place, and time. Epicritic sensation to light touch grossly present bilaterally.  Dermatologic Nails thickened elongated dystrophic mycotic toenails x  5 left Skin within normal limits  Orthopedic: Normal joint ROM without pain or crepitus bilaterally. No visible deformities. No bony tenderness.   Radiographs: None Assessment:   1. Long-term use of high-risk medication   2. Nail fungus   3. Onychomycosis due to dermatophyte    Plan:  Patient was evaluated and treated and all questions answered.  Left 1 through 5 onychomycosis/nail fungus -Educated the patient on the etiology of onychomycosis and various treatment options associated with improving the fungal load.  I explained to the patient that there is 3 treatment options available to treat the onychomycosis including  topical, p.o., laser treatment.  Patient elected to undergo p.o. options with Lamisil/terbinafine therapy.  In order for me to start the medication therapy, I explained to the patient the importance of evaluating the liver and obtaining the liver function test.  Once the liver function test comes back normal I will start him on 47-month course of Lamisil therapy.  Patient understood all risk and would like to proceed with Lamisil therapy.  I have asked the patient to immediately stop the Lamisil therapy if she has any reactions to it and call the office or go to the emergency room right away.  Patient states understanding   No follow-ups on file.

## 2023-07-14 LAB — HEPATIC FUNCTION PANEL
ALT: 36 IU/L — ABNORMAL HIGH (ref 0–32)
AST: 18 IU/L (ref 0–40)
Albumin: 4.3 g/dL (ref 3.8–4.9)
Alkaline Phosphatase: 110 IU/L (ref 44–121)
Bilirubin Total: 0.5 mg/dL (ref 0.0–1.2)
Bilirubin, Direct: 0.16 mg/dL (ref 0.00–0.40)
Total Protein: 6.7 g/dL (ref 6.0–8.5)

## 2023-07-23 ENCOUNTER — Inpatient Hospital Stay: Payer: 59

## 2023-07-23 ENCOUNTER — Inpatient Hospital Stay: Payer: 59 | Admitting: Hematology and Oncology

## 2023-08-01 ENCOUNTER — Telehealth: Payer: Self-pay

## 2023-08-01 ENCOUNTER — Encounter: Payer: 59 | Admitting: Family Medicine

## 2023-08-01 NOTE — Telephone Encounter (Signed)
 Copied from CRM [id number]. Topic: General - Other >> Aug 01, 2023 10:13 AM Everlene Hobby D wrote: Patient needs a copy of the 2025 physician visit form from January that she dropped off at her last visit. Patient would like it mailed to her.

## 2023-08-17 ENCOUNTER — Other Ambulatory Visit: Payer: Self-pay | Admitting: Hematology and Oncology

## 2023-09-05 ENCOUNTER — Other Ambulatory Visit: Payer: Self-pay | Admitting: Podiatry

## 2023-09-05 MED ORDER — TERBINAFINE HCL 250 MG PO TABS: 250.0000 mg | ORAL_TABLET | Freq: Every day | ORAL | 0 refills | Status: DC

## 2023-10-17 ENCOUNTER — Telehealth: Payer: Self-pay

## 2023-10-17 NOTE — Telephone Encounter (Signed)
 Copied from CRM [id number]. Topic: Referral - Request for Referral >> Oct 17, 2023  2:10 PM Fonda T wrote: Did the patient discuss referral with their provider in the last year? Yes (If No - schedule appointment) (If Yes - send message)  Appointment offered? No  Type of order/referral and detailed reason for visit: Referral to Gastroenterology, for colonoscopy   Preference of office, provider, location: Dr. Kristie at [facility], [address], [place], [place] Ph. [phone] [facility] desk)  If referral order, have you been seen by this specialty before? No (If Yes, this issue or another issue? When? Where?  Can we respond through MyChart? No, patient requests a return call at ph. [phone]  once referral is sent to specialty

## 2023-10-19 ENCOUNTER — Other Ambulatory Visit: Payer: Self-pay

## 2023-10-19 DIAGNOSIS — Z1211 Encounter for screening for malignant neoplasm of colon: Secondary | ICD-10-CM

## 2023-10-19 NOTE — Telephone Encounter (Signed)
Referral sent to Dr. Loreta Ave.

## 2023-10-23 ENCOUNTER — Telehealth: Payer: Self-pay

## 2023-10-23 NOTE — Telephone Encounter (Signed)
 Copied from CRM [id number]. Topic: Referral - Status >> Oct 23, 2023  3:05 PM Rosaria BRAVO wrote: Reason for CRM:   Olam from [facility] called to report that the patient cannot be seen at their office. Did not disclose why and declined to include any further information.  Best contact: [phone]

## 2023-11-14 ENCOUNTER — Ambulatory Visit: Admitting: Podiatry

## 2023-12-25 ENCOUNTER — Inpatient Hospital Stay: Attending: Hematology and Oncology

## 2023-12-25 ENCOUNTER — Inpatient Hospital Stay: Admitting: Hematology and Oncology

## 2023-12-25 ENCOUNTER — Other Ambulatory Visit: Payer: Self-pay | Admitting: *Deleted

## 2023-12-25 ENCOUNTER — Encounter: Admitting: Family Medicine

## 2023-12-25 VITALS — BP 118/84 | HR 108 | Temp 97.9°F | Resp 18 | Ht 62.5 in | Wt 189.6 lb

## 2023-12-25 DIAGNOSIS — I82432 Acute embolism and thrombosis of left popliteal vein: Secondary | ICD-10-CM

## 2023-12-25 DIAGNOSIS — Z7901 Long term (current) use of anticoagulants: Secondary | ICD-10-CM | POA: Diagnosis not present

## 2023-12-25 DIAGNOSIS — E669 Obesity, unspecified: Secondary | ICD-10-CM | POA: Insufficient documentation

## 2023-12-25 DIAGNOSIS — Z87891 Personal history of nicotine dependence: Secondary | ICD-10-CM | POA: Diagnosis not present

## 2023-12-25 DIAGNOSIS — Z86718 Personal history of other venous thrombosis and embolism: Secondary | ICD-10-CM | POA: Diagnosis present

## 2023-12-25 DIAGNOSIS — D6861 Antiphospholipid syndrome: Secondary | ICD-10-CM | POA: Insufficient documentation

## 2023-12-25 DIAGNOSIS — Z6836 Body mass index (BMI) 36.0-36.9, adult: Secondary | ICD-10-CM | POA: Diagnosis not present

## 2023-12-25 LAB — CBC WITH DIFFERENTIAL (CANCER CENTER ONLY)
Abs Immature Granulocytes: 0.01 K/uL (ref 0.00–0.07)
Basophils Absolute: 0.1 K/uL (ref 0.0–0.1)
Basophils Relative: 1 %
Eosinophils Absolute: 0.3 K/uL (ref 0.0–0.5)
Eosinophils Relative: 5 %
HCT: 38.9 % (ref 36.0–46.0)
Hemoglobin: 13.1 g/dL (ref 12.0–15.0)
Immature Granulocytes: 0 %
Lymphocytes Relative: 40 %
Lymphs Abs: 2.6 K/uL (ref 0.7–4.0)
MCH: 28.3 pg (ref 26.0–34.0)
MCHC: 33.7 g/dL (ref 30.0–36.0)
MCV: 84 fL (ref 80.0–100.0)
Monocytes Absolute: 0.5 K/uL (ref 0.1–1.0)
Monocytes Relative: 7 %
Neutro Abs: 3 K/uL (ref 1.7–7.7)
Neutrophils Relative %: 47 %
Platelet Count: 234 K/uL (ref 150–400)
RBC: 4.63 MIL/uL (ref 3.87–5.11)
RDW: 13.2 % (ref 11.5–15.5)
WBC Count: 6.5 K/uL (ref 4.0–10.5)
nRBC: 0 % (ref 0.0–0.2)

## 2023-12-25 LAB — CMP (CANCER CENTER ONLY)
ALT: 16 U/L (ref 0–44)
AST: 11 U/L — ABNORMAL LOW (ref 15–41)
Albumin: 4.3 g/dL (ref 3.5–5.0)
Alkaline Phosphatase: 88 U/L (ref 38–126)
Anion gap: 6 (ref 5–15)
BUN: 15 mg/dL (ref 6–20)
CO2: 27 mmol/L (ref 22–32)
Calcium: 9.9 mg/dL (ref 8.9–10.3)
Chloride: 107 mmol/L (ref 98–111)
Creatinine: 0.89 mg/dL (ref 0.44–1.00)
GFR, Estimated: >60 mL/min (ref >60)
Glucose, Bld: 96 mg/dL (ref 70–99)
Potassium: 3.8 mmol/L (ref 3.5–5.1)
Sodium: 140 mmol/L (ref 135–145)
Total Bilirubin: 0.5 mg/dL (ref 0.0–1.2)
Total Protein: 7.8 g/dL (ref 6.5–8.1)

## 2023-12-25 NOTE — Progress Notes (Signed)
 Patient Care Team: Aletha Bene, MD as PCP - General (Family Medicine)  DIAGNOSIS:  Encounter Diagnosis  Name Primary?   History of DVT of lower extremity Yes    CHIEF COMPLIANT: Follow-up to discuss anticoagulation guidelines regarding colonoscopy  HISTORY OF PRESENT ILLNESS: History of Present Illness Susan King is a 53 year old female who presents for evaluation prior to a colonoscopy.  She has been on Eliquis  for a year and a half following two episodes of blood clots, the first in November 2023 and the second in April 2024. She adheres to her medication regimen and has not experienced significant bleeding issues, although she noticed a bruise recently. She is concerned about stopping Eliquis  for two days prior to her upcoming colonoscopy, as she experiences calf pain if she misses a dose.  Gastrointestinal symptoms, including diarrhea, constipation, and stomach pain, began a year ago and led to the decision to undergo a colonoscopy.  Her current medications include Cymbalta, Victoza , Zofran , Lamisil , Topamax , trazodone, Ubrelvy, Valtrex , Valium, and Cymbalta. She has a prescription for Macrobid  from 2024, which she does not recall needing.  She experiences occasional foot swelling, which she cannot correlate with specific activities. Compression socks are uncomfortable due to heat, and she prefers not to wear them unless necessary for long travel. No significant bleeding issues such as epistaxis or gum bleeding.     ALLERGIES:  has no known allergies.  MEDICATIONS:  Current Outpatient Medications  Medication Sig Dispense Refill   diazepam (VALIUM) 5 MG tablet Take 5 mg by mouth every 6 (six) hours as needed.     DULoxetine (CYMBALTA) 60 MG capsule Take 60 mg by mouth daily.     ELIQUIS  5 MG TABS tablet TAKE 1 TABLET BY MOUTH TWICE A DAY 60 tablet 11   Insulin Pen Needle (BD PEN NEEDLE NANO U/F) 32G X 4 MM MISC Use as directed to inject Victoza  SQ QD. 100 each 1    liraglutide  (VICTOZA ) 18 MG/3ML SOPN INJECT 1.8 MG UNDER THE SKIN ONCE DAILY 9 mL 5   topiramate  (TOPAMAX ) 25 MG tablet Take 100 mg by mouth 2 (two) times daily.     traZODone (DESYREL) 50 MG tablet Take 50 mg by mouth at bedtime.     Ubrogepant (UBRELVY) 100 MG TABS Take 100 mg by mouth as needed (For resolution of migraines).      valACYclovir  (VALTREX ) 500 MG tablet Take 1 tablet (500 mg total) by mouth as needed. 60 tablet 1   No current facility-administered medications for this visit.    PHYSICAL EXAMINATION: ECOG PERFORMANCE STATUS: 1 - Symptomatic but completely ambulatory  Vitals:   12/25/23 1436  BP: 118/84  Pulse: (!) 108  Resp: 18  Temp: 97.9 F (36.6 C)  SpO2: 100%   Filed Weights   12/25/23 1436  Weight: 189 lb 9.6 oz (86 kg)      LABORATORY DATA:  I have reviewed the data as listed    Latest Ref Rng & Units 12/25/2023    2:18 PM 07/13/2023    1:53 PM 04/25/2023    2:41 PM  CMP  Glucose 70 - 99 mg/dL 96   81   BUN 6 - 20 mg/dL 15   12   Creatinine 9.55 - 1.00 mg/dL 9.10   9.17   Sodium 864 - 145 mmol/L 140   141   Potassium 3.5 - 5.1 mmol/L 3.8   4.6   Chloride 98 - 111 mmol/L 107   107  CO2 22 - 32 mmol/L 27   25   Calcium  8.9 - 10.3 mg/dL 9.9   9.9   Total Protein 6.5 - 8.1 g/dL 7.8  6.7  7.6   Total Bilirubin 0.0 - 1.2 mg/dL 0.5  0.5  0.6   Alkaline Phos 38 - 126 U/L 88  110    AST 15 - 41 U/L 11  18  13    ALT 0 - 44 U/L 16  36  18     Lab Results  Component Value Date   WBC 6.5 12/25/2023   HGB 13.1 12/25/2023   HCT 38.9 12/25/2023   MCV 84.0 12/25/2023   PLT 234 12/25/2023   NEUTROABS 3.0 12/25/2023    ASSESSMENT & PLAN:  History of DVT of lower extremity First episode of unprovoked blood clot November 2020 (popliteal vein) Second Episode: 06/28/22: Foot and calf pain: US : Age indeterminate DVT involving Left peroneal Veins   Current treatment: Originally Xarelto  but now Eliquis    Patient's risk factors include: 1. 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 Tobacco use: Patient discontinued smoking November 2020 3. Obesity: BMI 36.5 4. Sedentary behavior including postoperative state: Patient's job is completely sedentary    Repeat testing 08/26/2019: Consistent with the presence of lupus anticoagulant Recommendations anticoagulation for life.  (Because of recurrent blood clots): Currently on Eliquis .  I renewed her prescription today.   Colonoscopy anticoagulation guidelines: I discussed with her that she could stop anticoagulation 2 days prior to colonoscopy.  I did not recommend a Lovenox  bridge.  She will reinitiate anticoagulation on the evening of the day of the colonoscopy.  Recheck in 1 year with a telephone visit ------------------------------------- Assessment and Plan Assessment & Plan Deep venous thrombosis and anticoagulation management in the setting of antiphospholipid antibody syndrome On long-term Eliquis  for antiphospholipid antibody syndrome. Occasional bruising noted. Calf pain when missing Eliquis  dose suggests thrombosis risk. No Lovenox  bridge needed for medium-risk colonoscopy. - Hold Eliquis  for two days prior to colonoscopy. - Resume Eliquis  the evening after the colonoscopy. - Monitor for signs of thrombosis, especially calf pain. - Consider Lovenox  bridge only if extremely high risk of thrombosis.  Obesity Obesity increases DVT risk. Advised increased mobility. Weight loss attempts hindered by gastrointestinal issues, pending colonoscopy evaluation. - Encourage regular movement and exercise to aid weight management. - Proceed with colonoscopy to evaluate gastrointestinal symptoms.      No orders of the defined types were placed in this encounter.  The patient has a good understanding of the overall plan. she agrees with it. she will call with any problems that may develop before the next visit here.  I personally spent a total of 30 minutes in the care of the  patient today including preparing to see the patient, getting/reviewing separately obtained history, performing a medically appropriate exam/evaluation, counseling and educating, placing orders, referring and communicating with other health care professionals, documenting clinical information in the EHR, independently interpreting results, communicating results, and coordinating care.   Viinay K Larnie Heart, MD 12/25/23

## 2023-12-25 NOTE — Assessment & Plan Note (Signed)
 First episode of unprovoked blood clot November 2020 (popliteal vein) Second Episode: 06/28/22: Foot and calf pain: US : Age indeterminate DVT involving Left peroneal Veins   Current treatment: Originally Xarelto  but now Eliquis    Patient's risk factors include: 1. 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 Tobacco use: Patient discontinued smoking November 2020 3. Obesity: BMI 36.5 4. Sedentary behavior including postoperative state: Patient's job is completely sedentary    Repeat testing 08/26/2019: Consistent with the presence of lupus anticoagulant Recommendations anticoagulation for life.  (Because of recurrent blood clots): Currently on Eliquis .  I renewed her prescription today.   Recheck in 1 year

## 2024-01-01 ENCOUNTER — Telehealth: Payer: Self-pay

## 2024-01-01 NOTE — Telephone Encounter (Signed)
 Copied from CRM [id number]. Topic: Referral - Request for Referral >> Jan 01, 2024 11:10 AM Delon DASEN wrote: Did the patient discuss referral with their provider in the last year? Yes (If No - schedule appointment) (If Yes - send message)  Appointment offered? No  Type of order/referral and detailed reason for visit: dermatologist  Preference of office, provider, location: [facility]  If referral order, have you been seen by this specialty before? No (If Yes, this issue or another issue? When? Where?  Can we respond through MyChart? Yes
